# Patient Record
Sex: Female | Born: 1945 | Race: White | Hispanic: No | Marital: Married | Smoking: Former smoker
Health system: Southern US, Community
[De-identification: ages and names within clinical notes are randomized; demographics above are authoritative.]

## PROBLEM LIST (undated history)

## (undated) DIAGNOSIS — G459 Transient cerebral ischemic attack, unspecified: Secondary | ICD-10-CM

## (undated) DIAGNOSIS — K922 Gastrointestinal hemorrhage, unspecified: Secondary | ICD-10-CM

## (undated) DIAGNOSIS — I10 Essential (primary) hypertension: Secondary | ICD-10-CM

## (undated) DIAGNOSIS — K219 Gastro-esophageal reflux disease without esophagitis: Secondary | ICD-10-CM

## (undated) DIAGNOSIS — E119 Type 2 diabetes mellitus without complications: Secondary | ICD-10-CM

## (undated) DIAGNOSIS — J189 Pneumonia, unspecified organism: Secondary | ICD-10-CM

## (undated) DIAGNOSIS — I4891 Unspecified atrial fibrillation: Secondary | ICD-10-CM

## (undated) DIAGNOSIS — I350 Nonrheumatic aortic (valve) stenosis: Secondary | ICD-10-CM

## (undated) DIAGNOSIS — M869 Osteomyelitis, unspecified: Secondary | ICD-10-CM

## (undated) DIAGNOSIS — G629 Polyneuropathy, unspecified: Secondary | ICD-10-CM

## (undated) DIAGNOSIS — E039 Hypothyroidism, unspecified: Secondary | ICD-10-CM

## (undated) DIAGNOSIS — R9430 Abnormal result of cardiovascular function study, unspecified: Secondary | ICD-10-CM

## (undated) DIAGNOSIS — T7840XA Allergy, unspecified, initial encounter: Secondary | ICD-10-CM

## (undated) DIAGNOSIS — Z91040 Latex allergy status: Secondary | ICD-10-CM

## (undated) DIAGNOSIS — D649 Anemia, unspecified: Secondary | ICD-10-CM

## (undated) DIAGNOSIS — I639 Cerebral infarction, unspecified: Secondary | ICD-10-CM

## (undated) DIAGNOSIS — I340 Nonrheumatic mitral (valve) insufficiency: Secondary | ICD-10-CM

## (undated) DIAGNOSIS — I2510 Atherosclerotic heart disease of native coronary artery without angina pectoris: Secondary | ICD-10-CM

## (undated) HISTORY — PX: TONSILLECTOMY: SUR1361

## (undated) HISTORY — PX: CHOLECYSTECTOMY: SHX55

## (undated) HISTORY — DX: Abnormal result of cardiovascular function study, unspecified: R94.30

## (undated) HISTORY — DX: Latex allergy status: Z91.040

## (undated) HISTORY — DX: Transient cerebral ischemic attack, unspecified: G45.9

## (undated) HISTORY — PX: TONSILLECTOMY: SHX5217

## (undated) HISTORY — PX: HERNIA REPAIR: SHX51

## (undated) HISTORY — DX: Nonrheumatic mitral (valve) insufficiency: I34.0

## (undated) HISTORY — PX: VENTRAL HERNIA REPAIR: SHX424

## (undated) HISTORY — DX: Unspecified atrial fibrillation: I48.91

## (undated) HISTORY — DX: Gastrointestinal hemorrhage, unspecified: K92.2

## (undated) HISTORY — PX: ABDOMINAL HYSTERECTOMY: SHX81

## (undated) HISTORY — DX: Atherosclerotic heart disease of native coronary artery without angina pectoris: I25.10

## (undated) HISTORY — DX: Nonrheumatic aortic (valve) stenosis: I35.0

## (undated) HISTORY — DX: Allergy, unspecified, initial encounter: T78.40XA

## (undated) HISTORY — PX: OTHER SURGICAL HISTORY: SHX169

---

## 1980-09-07 HISTORY — PX: OTHER SURGICAL HISTORY: SHX169

## 2003-03-28 ENCOUNTER — Ambulatory Visit (HOSPITAL_COMMUNITY): Admission: RE | Admit: 2003-03-28 | Discharge: 2003-03-28 | Payer: Self-pay | Admitting: Unknown Physician Specialty

## 2004-02-13 ENCOUNTER — Ambulatory Visit (HOSPITAL_COMMUNITY): Admission: RE | Admit: 2004-02-13 | Discharge: 2004-02-13 | Payer: Self-pay | Admitting: Unknown Physician Specialty

## 2004-02-15 ENCOUNTER — Ambulatory Visit (HOSPITAL_COMMUNITY): Admission: RE | Admit: 2004-02-15 | Discharge: 2004-02-15 | Payer: Self-pay | Admitting: Unknown Physician Specialty

## 2004-09-29 ENCOUNTER — Ambulatory Visit: Payer: Self-pay | Admitting: Family Medicine

## 2005-06-10 ENCOUNTER — Ambulatory Visit: Payer: Self-pay | Admitting: Family Medicine

## 2005-07-02 ENCOUNTER — Ambulatory Visit: Payer: Self-pay | Admitting: Family Medicine

## 2006-07-21 ENCOUNTER — Ambulatory Visit: Payer: Self-pay | Admitting: Family Medicine

## 2006-07-22 ENCOUNTER — Ambulatory Visit: Payer: Self-pay | Admitting: Family Medicine

## 2006-09-28 ENCOUNTER — Ambulatory Visit: Payer: Self-pay | Admitting: Family Medicine

## 2007-01-13 ENCOUNTER — Ambulatory Visit: Payer: Self-pay | Admitting: Family Medicine

## 2008-03-14 ENCOUNTER — Ambulatory Visit: Payer: Self-pay | Admitting: Vascular Surgery

## 2008-04-13 ENCOUNTER — Ambulatory Visit: Payer: Self-pay | Admitting: Vascular Surgery

## 2008-05-02 ENCOUNTER — Ambulatory Visit: Payer: Self-pay | Admitting: Vascular Surgery

## 2008-05-16 ENCOUNTER — Ambulatory Visit: Payer: Self-pay | Admitting: Vascular Surgery

## 2008-06-19 ENCOUNTER — Ambulatory Visit: Payer: Self-pay | Admitting: Vascular Surgery

## 2009-12-04 ENCOUNTER — Ambulatory Visit: Payer: Self-pay | Admitting: Cardiology

## 2010-01-17 ENCOUNTER — Encounter: Admission: RE | Admit: 2010-01-17 | Discharge: 2010-01-17 | Payer: Self-pay | Admitting: Gastroenterology

## 2010-02-13 ENCOUNTER — Encounter: Admission: RE | Admit: 2010-02-13 | Discharge: 2010-02-13 | Payer: Self-pay | Admitting: Neurology

## 2011-01-20 NOTE — Letter (Signed)
March 14, 2008   Margaretmary Bayley, M.D.  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  As  you know, she is a 65 year old white female with a long history of  venous pathology.  She has undergone prior vein stripping on the left  years ago.  This was done in [place] in the 1980s.  It appears that she has  had her saphenous vein stripped from her ankle to her knee and stab  phlebectomy as well.  She has had a several-month history now of  ulceration over her left lateral malleolus.  She had a trivial abrasion  of this on a chair, and this developed a skin tear with very poor  healing.  She had sutures placed greater than a month ago at the ER, and  I have removed these sutures today.  She also recently dropped a book on  her medial dorsal foot and also has a tear at this area as well.  She  does not have any history of arterial insufficiency.   MEDICAL HISTORY:  Significant for hypertension, bronchitis, and heart  murmur.  She does have a history of non-insulin-dependent diabetes.   SOCIAL HISTORY:  She is married with 1 child.  She is retired.  She quit  smoking 25 years ago, does not drink alcohol on a regular basis.   REVIEW OF SYSTEMS:  Positive for weight gain.  She currently weighs 280  pounds.  She is 5 feet 10 inches tall.  She does have a history of arthritis as well.   PHYSICAL EXAM:  Well-developed obese white female, in no acute distress.  She does have dorsalis pedis pulses bilaterally.  Her lower extremities  are noted for marked saphenous and tributary varicosities bilaterally,  more so on her left leg than on her right.  She does have an angry-  appearing ulcer over her lateral ankle above her malleolus with marked  skin changes of  hyperpigmentation.  She has a clean tear in the medial  aspect of the arch of her foot.   She underwent noninvasive vascular laboratory studies in our office, and  this reveals reflux in the lateral branch of her saphenous vein on her  left leg, and also her small saphenous vein.  She does have reflux in  her left common femoral vein and mild reflux in her right common femoral  vein.  I had a long discussion with the patient and her family present.  I explained the critical importance of compression for healing of her  venous ulcer.  We have initiated her on Silvadene and Ace wrap  treatments to this.  I plan to see her again in 1 month.  I think that  she does have a greater chance for long-term healing and reduction of  recurrence of her venous ulcers if she would have treatment with  ablation of her lateral branch of her incompetent great saphenous vein  and stab phlebectomies of her multiple tributary varicosities.  We will  discuss this further as we can achieve healing of her ankle ulcer.  We  will see her again in 1 month and I appreciate the  opportunity to see  this nice patient with you.   Larina Earthly, M.D.  Electronically Signed   TFE/MEDQ  D:  03/14/2008  T:  03/15/2008  Job:  [id number]

## 2011-01-20 NOTE — Procedures (Signed)
LOWER EXTREMITY VENOUS REFLUX EXAM   INDICATION:  Swelling.   EXAM:  Using color-flow imaging and pulse Doppler spectral analysis, the  bilateral common femoral, superficial femoral, popliteal, posterior  tibial, greater and lesser saphenous veins are evaluated.  There is  evidence suggesting deep venous insufficiency in the bilateral lower  extremities at the common femoral vein level (mild on the right and  severe on the left).   The right saphenofemoral junction is competent.  The left saphenofemoral  junction is not competent.  The bilateral greater saphenous veins are  competent.  The left lateral accessory saphenous vein is not competent  with a maximum diameter of 1.34 cm at the proximal thigh and marked  tortuosity extending from the mid thigh to the distal calf.   The left proximal short saphenous vein demonstrates incompetency with  caliber measurements ranging from 0.24 to 0.2 cm.  The right proximal  short saphenous vein demonstrates competency.   GSV Diameter (used if found to be incompetent only)                                            Right    Left  Proximal Greater Saphenous Vein           cm       cm  Proximal-to-mid-thigh                     cm       cm  Mid thigh                                 cm       cm  Mid-distal thigh                          cm       cm  Distal thigh                              cm       cm  Knee                                      cm       cm   IMPRESSION:  1. Bilateral greater saphenous vein reflux is not identified.  2. Left lateral accessory saphenous vein reflux is noted, as described      above.  3. The bilateral greater saphenous veins are not aneurysmal or      tortuous.  4. The deep venous system is not competent, as described above.  5. The right lesser saphenous vein is competent.  6. The left lesser saphenous vein is not competent, as described      above.      ___________________________________________  Larina Earthly, M.D.   CH/MEDQ  D:  03/14/2008  T:  03/14/2008  Job:  [id number]

## 2011-01-20 NOTE — Assessment & Plan Note (Signed)
OFFICE VISIT   Melissa Becker, Melissa Becker  DOB:  Aug 10, 1946                                       04/13/2008  CHART#:[id number]   The patient presents today for continued followup of her severe venous  ulcer.  She has had some healing, which is quite slow on the left  pretibial shin area.  She does have incompetence of her anterior branch  of her great saphenous vein with multiple tributary varicosities feeding  into this area.  She will continue her local care facility and Ace  wraps.  I have recommend we proceed with laser ablation and stab  phlebectomy of her left great saphenous vein and tributaries for  improvement of the chances of healing and to maintain healing.  We will  proceed with this when we can assure insurance coverage for her.   Larina Earthly, M.D.  Electronically Signed   TFE/MEDQ  D:  04/13/2008  T:  04/16/2008  Job:  [id number]   cc:   Margaretmary Bayley, M.D.

## 2011-01-20 NOTE — Assessment & Plan Note (Signed)
OFFICE VISIT   Melissa Becker, Melissa Becker  DOB:  01/17/1946                                       06/19/2008  CHART#:[id number]   The patient is a patient of Dr. Bosie Helper who was scheduled today by  mistake.  She has had multiple stab phlebectomies in the left leg by Dr.  Arbie Cookey on August 26.  She had a stasis ulcer on the lateral aspect of the  distal lower leg on the left.  She has a remote history of ligation and  stripping of the left greater saphenous vein.  There is reflux in a  lateral accessory branch of the left saphenous vein, which he attempted  to close, but was unable to access it.  She returns today with complete  healing of the stasis ulcer, but continued discomfort and prominent  varicosities below the knee.  She also has diffuse varicosities in the  right lower extremity and has a history of bleeding on a few occasions  in the past.  Previous venous duplex exam reveals no reflux in the right  greater saphenous or right lesser saphenous systems.   EXAM:  She does have multiple varicosities in the right thigh and calf  with some very superficial ones in the pretibial area on the right leg.  There is hyperpigmentation and scaliness of the skin in the left leg  with complete healing of the ulcer, although the skin is thin.   We recommended wearing short-leg elastic compression stockings on a  chronic basis, as well as elevating the leg at night.  She may benefit  from stab phlebectomies of this collection of lateral calf varicosities,  which are quite large, extending down to the area of skin change.  She  will return in 3 months for further followup with Dr. Arbie Cookey.   [name], M.D.  Electronically Signed   JDL/MEDQ  D:  06/19/2008  T:  06/20/2008  Job:  [id number]

## 2011-01-20 NOTE — Assessment & Plan Note (Signed)
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  She had had a procedure 2 weeks  ago.  She did have access short segment of anterior saphenous.  She had  had prior stripping of her saphenous vein.  There was an anterior branch  that had a short segment in her proximal thigh that fed a very large  tributary coursing over the anterolateral thigh to her lateral knee and  on to her calf.  Despite using the Philips imager due to the depth and  short segment just did not get a safe imaging to treat this area and  therefore abandoning laser ablation of this segment.  We did proceed  with stab phlebectomy of the marked tributary varicosities on her  anterior and lateral thigh.  She has had good response.  She reports  relief of discomfort and does have continued healing of the pretibial  ulcer on the left.  Her stab phlebectomy incisions are healing, one on  the anterior thigh was through prior scar from a large incision from her  prior vein surgery.  There was some erythema of this and this is  resolving as well.  She will continue her usual activities, will  continue Silvadene and Ace wrap to her ankle and calf and I will see her  again in 1 month for continued followup.   Larina Earthly, M.D.  Electronically Signed   TFE/MEDQ  D:  05/16/2008  T:  05/17/2008  Job:  [id number]

## 2011-01-23 NOTE — Procedures (Signed)
NAME:  Melissa Becker, Melissa Becker                           ACCOUNT NO.:  [id number]   MEDICAL RECORD NO.:  [id number]                   PATIENT TYPE:  OUT   LOCATION:  RAD                                  FACILITY:  [facility]   PHYSICIAN:  Dani Gobble, MD                    DATE OF BIRTH:  09-27-1945   DATE OF PROCEDURE:  03/28/2003  DATE OF DISCHARGE:                                  ECHOCARDIOGRAM   PROCEDURE:  Echocardiogram   INDICATIONS:  Ms. Dimaano is a 65 year old female with a history of right  bundle branch block and hypertension, diabetes , and childhood rheumatic  fever who was found to have a systolic murmur.   TECHNICAL QUALITY:  The technical quality of this study is adequate.   FINDINGS:  1. The aorta is within normal limits at 2.5 cm.  2. The left atrium is dilated at 4.5 cm.  No obvious clots or masses were     appreciated.  The patient appeared to be in sinus rhythm during this     procedure.  3. The interventricular septum and posterior wall were mildly thickened.  4. The aortic valve was not well visualized, but appeared to be mildly     thickened.  No obvious aortic insufficiency was noted.  Doppler     interrogation of the aortic valve revealed a peak velocity of 1.6     meters/second corresponding to a peak gradient of 16 mm Hg and a mean     gradient of 9 mmHg.  5. The mitral valve is also mildly thickened on both the anterior and     posterior leaflets.  No mitral valve prolapse is noted.  No hockey     stick appearance, doming, or restriction of leaflet excursion to suggest     rheumatic involvement of the mitral valve.  Mild mitral regurgitation is     noted.  The Doppler interrogation of the mitral valve was within normal     limits.  Mild mitral annular calcification was noted.  6. The pulmonic valve was not well visualized, but trivial pulmonic     insufficiency was noted.  7. The tricuspid valve appeared grossly structurally normal with trace-to-     mild  tricuspid regurgitation.  8. The left ventricular was normal in size with the LVIDD measured at 5.2 cm     and the LVISD measured at 3.9 cm.  Overall left ventricular systolic     function was normal and no regional wall motion abnormalities were noted.     There was no evidence for diastolic dysfunction.  The right atrium and     the right ventricle appeared normal in size with normal right ventricular     systolic function.   IMPRESSION:  1. Mild left atrial enlargement.  2. Mild concentric left ventricular hypertrophy.  3. Normal left ventricular chamber size and  systolic function.  4. No regional wall motion abnormalities noted.  5. Mildly thickened mitral valve (both leaflets) but no limitation to     leaflet excursion and no findings suggestive of rheumatic involvement of     the mitral valve.  No mitral valve prolapse is noted.  Mild mitral     regurgitation is     noted.  Mild mitral annular calcification is present.  6. Trace-to-mild tricuspid regurgitation.  7. The aortic valve is thickened consistent with aortic sclerosis without     true stenosis noted.                                               Dani Gobble, MD    AB/MEDQ  D:  03/28/2003  T:  03/28/2003  Job:  [id number]   cc:   Colon Flattery, MD  [address]  [place]  [place] [address]  Fax: [phone]

## 2012-09-07 DIAGNOSIS — K922 Gastrointestinal hemorrhage, unspecified: Secondary | ICD-10-CM

## 2012-09-07 HISTORY — DX: Gastrointestinal hemorrhage, unspecified: K92.2

## 2013-04-13 DIAGNOSIS — R748 Abnormal levels of other serum enzymes: Secondary | ICD-10-CM

## 2013-04-14 ENCOUNTER — Other Ambulatory Visit: Payer: Self-pay | Admitting: Physician Assistant

## 2013-04-14 ENCOUNTER — Inpatient Hospital Stay (HOSPITAL_COMMUNITY)
Admission: AD | Admit: 2013-04-14 | Discharge: 2013-04-19 | DRG: 378 | Disposition: A | Discharge status: Home / Self Care | Payer: Medicare Other | Source: Other Acute Inpatient Hospital | Attending: Internal Medicine | Admitting: Internal Medicine

## 2013-04-14 ENCOUNTER — Encounter (HOSPITAL_COMMUNITY): Payer: Self-pay | Admitting: Gastroenterology

## 2013-04-14 DIAGNOSIS — I519 Heart disease, unspecified: Secondary | ICD-10-CM | POA: Diagnosis not present

## 2013-04-14 DIAGNOSIS — K922 Gastrointestinal hemorrhage, unspecified: Secondary | ICD-10-CM

## 2013-04-14 DIAGNOSIS — R0989 Other specified symptoms and signs involving the circulatory and respiratory systems: Secondary | ICD-10-CM | POA: Diagnosis present

## 2013-04-14 DIAGNOSIS — E669 Obesity, unspecified: Secondary | ICD-10-CM

## 2013-04-14 DIAGNOSIS — E1169 Type 2 diabetes mellitus with other specified complication: Secondary | ICD-10-CM | POA: Diagnosis present

## 2013-04-14 DIAGNOSIS — R6884 Jaw pain: Secondary | ICD-10-CM | POA: Diagnosis present

## 2013-04-14 DIAGNOSIS — I4949 Other premature depolarization: Secondary | ICD-10-CM | POA: Diagnosis present

## 2013-04-14 DIAGNOSIS — I249 Acute ischemic heart disease, unspecified: Secondary | ICD-10-CM

## 2013-04-14 DIAGNOSIS — R079 Chest pain, unspecified: Secondary | ICD-10-CM

## 2013-04-14 DIAGNOSIS — R0600 Dyspnea, unspecified: Secondary | ICD-10-CM

## 2013-04-14 DIAGNOSIS — E119 Type 2 diabetes mellitus without complications: Secondary | ICD-10-CM | POA: Diagnosis present

## 2013-04-14 DIAGNOSIS — D649 Anemia, unspecified: Secondary | ICD-10-CM

## 2013-04-14 DIAGNOSIS — Y840 Cardiac catheterization as the cause of abnormal reaction of the patient, or of later complication, without mention of misadventure at the time of the procedure: Secondary | ICD-10-CM | POA: Diagnosis not present

## 2013-04-14 DIAGNOSIS — I441 Atrioventricular block, second degree: Secondary | ICD-10-CM

## 2013-04-14 DIAGNOSIS — I2510 Atherosclerotic heart disease of native coronary artery without angina pectoris: Secondary | ICD-10-CM | POA: Diagnosis present

## 2013-04-14 DIAGNOSIS — D62 Acute posthemorrhagic anemia: Secondary | ICD-10-CM

## 2013-04-14 DIAGNOSIS — I10 Essential (primary) hypertension: Secondary | ICD-10-CM

## 2013-04-14 DIAGNOSIS — R0602 Shortness of breath: Secondary | ICD-10-CM

## 2013-04-14 DIAGNOSIS — I442 Atrioventricular block, complete: Secondary | ICD-10-CM | POA: Diagnosis not present

## 2013-04-14 DIAGNOSIS — K31811 Angiodysplasia of stomach and duodenum with bleeding: Principal | ICD-10-CM | POA: Diagnosis present

## 2013-04-14 DIAGNOSIS — R0609 Other forms of dyspnea: Secondary | ICD-10-CM | POA: Diagnosis present

## 2013-04-14 HISTORY — DX: Polyneuropathy, unspecified: G62.9

## 2013-04-14 HISTORY — DX: Essential (primary) hypertension: I10

## 2013-04-14 HISTORY — DX: Type 2 diabetes mellitus without complications: E11.9

## 2013-04-14 HISTORY — DX: Hypothyroidism, unspecified: E03.9

## 2013-04-14 MED ORDER — ATORVASTATIN CALCIUM 80 MG PO TABS
80.0000 mg | ORAL_TABLET | Freq: Every day | ORAL | Status: DC
  Administered 2013-04-14 – 2013-04-18 (×5): 80 mg via ORAL
  Filled 2013-04-14 (×6): qty 1

## 2013-04-14 MED ORDER — INSULIN ASPART 100 UNIT/ML ~~LOC~~ SOLN
0.0000 [IU] | Freq: Three times a day (TID) | SUBCUTANEOUS | Status: DC
  Administered 2013-04-15: 3 [IU] via SUBCUTANEOUS

## 2013-04-14 MED ORDER — DULOXETINE HCL 60 MG PO CPEP
60.0000 mg | ORAL_CAPSULE | Freq: Every day | ORAL | Status: DC
  Administered 2013-04-14 – 2013-04-19 (×5): 60 mg via ORAL
  Filled 2013-04-14 (×6): qty 1

## 2013-04-14 MED ORDER — AMLODIPINE BESYLATE 10 MG PO TABS
10.0000 mg | ORAL_TABLET | Freq: Every day | ORAL | Status: DC
  Administered 2013-04-14 – 2013-04-19 (×5): 10 mg via ORAL
  Filled 2013-04-14 (×6): qty 1

## 2013-04-14 MED ORDER — PANTOPRAZOLE SODIUM 40 MG PO TBEC: 40.0000 mg | DELAYED_RELEASE_TABLET | Freq: Every day | ORAL | Status: DC

## 2013-04-14 MED ORDER — LINAGLIPTIN 5 MG PO TABS
5.0000 mg | ORAL_TABLET | Freq: Every day | ORAL | Status: DC
  Administered 2013-04-16 – 2013-04-19 (×3): 5 mg via ORAL
  Filled 2013-04-14 (×5): qty 1

## 2013-04-14 MED ORDER — FERROUS FUMARATE 325 (106 FE) MG PO TABS
1.0000 | ORAL_TABLET | Freq: Three times a day (TID) | ORAL | Status: DC
  Administered 2013-04-14 – 2013-04-19 (×15): 106 mg via ORAL
  Filled 2013-04-14 (×17): qty 1

## 2013-04-14 MED ORDER — GLIMEPIRIDE 4 MG PO TABS
4.0000 mg | ORAL_TABLET | Freq: Every day | ORAL | Status: DC
  Administered 2013-04-16 – 2013-04-19 (×2): 4 mg via ORAL
  Filled 2013-04-14 (×6): qty 1

## 2013-04-14 MED ORDER — INSULIN ASPART 100 UNIT/ML ~~LOC~~ SOLN: 0.0000 [IU] | Freq: Every day | SUBCUTANEOUS | Status: DC

## 2013-04-14 MED ORDER — HYDROCODONE-ACETAMINOPHEN 5-325 MG PO TABS: 1.0000 | ORAL_TABLET | ORAL | Status: DC | PRN

## 2013-04-14 MED ORDER — LEVOTHYROXINE SODIUM 100 MCG PO TABS
100.0000 ug | ORAL_TABLET | Freq: Every day | ORAL | Status: DC
  Administered 2013-04-16 – 2013-04-19 (×3): 100 ug via ORAL
  Filled 2013-04-14 (×6): qty 1

## 2013-04-14 MED ORDER — SODIUM CHLORIDE 0.9 % IV SOLN
INTRAVENOUS | Status: DC
  Administered 2013-04-14 – 2013-04-15 (×2): via INTRAVENOUS

## 2013-04-14 MED ORDER — ASPIRIN-DIPYRIDAMOLE ER 25-200 MG PO CP12
1.0000 | ORAL_CAPSULE | Freq: Every day | ORAL | Status: DC
  Filled 2013-04-14 (×2): qty 1

## 2013-04-14 MED ORDER — NITROGLYCERIN 0.4 MG SL SUBL: 0.4000 mg | SUBLINGUAL_TABLET | SUBLINGUAL | Status: DC | PRN

## 2013-04-14 MED ORDER — ACETAMINOPHEN 325 MG PO TABS: 650.0000 mg | ORAL_TABLET | ORAL | Status: DC | PRN

## 2013-04-14 MED ORDER — DIPHENHYDRAMINE HCL 12.5 MG/5ML PO ELIX
12.5000 mg | ORAL_SOLUTION | Freq: Once | ORAL | Status: AC
  Administered 2013-04-14: 12.5 mg via ORAL
  Filled 2013-04-14: qty 5

## 2013-04-14 MED ORDER — BENAZEPRIL HCL 40 MG PO TABS
40.0000 mg | ORAL_TABLET | Freq: Every day | ORAL | Status: DC
  Administered 2013-04-16 – 2013-04-19 (×4): 40 mg via ORAL
  Filled 2013-04-14 (×6): qty 1

## 2013-04-14 MED ORDER — ONDANSETRON HCL 4 MG/2ML IJ SOLN: 4.0000 mg | Freq: Four times a day (QID) | INTRAMUSCULAR | Status: DC | PRN

## 2013-04-14 MED ORDER — PANTOPRAZOLE SODIUM 40 MG PO TBEC
40.0000 mg | DELAYED_RELEASE_TABLET | Freq: Two times a day (BID) | ORAL | Status: DC
  Administered 2013-04-14 – 2013-04-19 (×9): 40 mg via ORAL
  Filled 2013-04-14 (×9): qty 1

## 2013-04-14 NOTE — Progress Notes (Signed)
Patient ID: Melissa Becker, female   DOB: 1945/11/20, 67 y.o.   MRN: [id number]   I saw this patient today at [facility]. Complete consultation was done and all of the records were to have been sent. I will try to help arrange for these to be appropriately scanned into EPIC.  The patient has cardiac disease that needs to be assessed further during this hospitalization. However the plan is to do this after her GI evaluation is done. She has a bundle branch block. 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 We have seen more of this rhythm during her hospitalization at [facility]. At times there is 2-1 block. This has not been symptomatic. She has had some chest discomfort over the past months. However she is admitted with a hemoglobin of 7.  The patient was transfused yesterday. She did receive one dose of Lasix when she became short of breath.  We need to proceed with her GI evaluation. Based on what is found we can make further decisions about the treatment of her GI tract. We can then also proceed with further evaluation of her chest discomfort and further evaluation of her rhythm abnormality ( if this is needed). If it appears that a coronary intervention is needed at some time, we will have the information from the GI evaluation to help with the planning.  Jerral Bonito, MD

## 2013-04-14 NOTE — Consult Note (Signed)
[facility] Reason for consult: G.I. bleeding Referring Physician: Dr. Myrtis Ser, PCP: Dr Osvaldo Human is an 67 y.o. female.  HPI: 67 year old woman and I saw some years ago. She apparently had 3 attempts and colonoscopy by Dr Michaelyn Barter and [facility]. These were done to cause of a strong family history of colon cancer and her father. I saw her in 2011 with rectal bleeding. We felt that in view of failed colonoscopies and bright red blood that a barium enema in sigmoidoscopy would be appropriate. The patient and apparently had TIAs around that time and have been started in Aggrenox. barium enema in essence was negative and flexible sigmoidoscopy to 40 cm was normal other than what appeared to be a red and polypoid area 25 cm with half showing hyperplastic area without any adenomatous tissue. We recommended yearly Hemoccult's and repeat barium enema/sigmoidoscopy or virtual colonoscopy at 5 year interval. The patient has been feeling very weak and short of breath with some chest pain and pain in her jaw with exertion. She saw Dr Lysbeth Galas with these symptoms and was found to have a hemoglobin of 7. She was sent to [facility] and receive 2 units of blood. Apparently there were some cardiac issues. I have no records available to me at the patient reports to me that she had some type of arrhythmia and was transferred down here for possible catheterization of her heart and workup for G.I. Bleeding. She is head approximately 2 months of dark stool with out any heartburn or indigestion. She's had postprandial bloating and marked increase in belching. She is not been taking any routine acid reducing medications. She adamantly denies the use of NSAIDs. I diet has been ordered for her for dinner tonight.  Past Medical History  Diagnosis Date  . Hypothyroid   . Type 2 diabetes mellitus   . Hypertension   . Cerebrovascular disease     history of TIAs  . Neuropathy     Past Surgical  History  Procedure Laterality Date  . Tonsillectomy    . Abdominal hysterectomy    . Cholecystectomy    . Ventral hernia repair      Family History  Problem Relation Age of Onset  . Colon cancer Father     Social History:  reports that she has never smoked. She does not have any smokeless tobacco history on file. She reports that she does not drink alcohol or use illicit drugs.  Allergies: Allergies not on file  Medications; . amLODipine  10 mg Oral Daily  . atorvastatin  80 mg Oral q1800  . benazepril  40 mg Oral Daily  . [START ON 04/15/2013] dipyridamole-aspirin  1 capsule Oral Daily  . DULoxetine  60 mg Oral Daily  . ferrous fumarate  1 tablet Oral TID  . [START ON 04/15/2013] glimepiride  4 mg Oral Q breakfast  . insulin aspart  0-15 Units Subcutaneous TID WC  . insulin aspart  0-5 Units Subcutaneous QHS  . [START ON 04/15/2013] levothyroxine  100 mcg Oral QAC breakfast  . [START ON 04/15/2013] linagliptin  5 mg Oral Daily  . [START ON 04/15/2013] pantoprazole  40 mg Oral Q1200   PRN Meds acetaminophen, HYDROcodone-acetaminophen, nitroGLYCERIN, ondansetron (ZOFRAN) IV Results for orders placed during the hospital encounter of 04/14/13 (from the past 48 hour(s))  GLUCOSE, CAPILLARY     Status: Abnormal   Collection Time    04/14/13  3:49 PM      Result Value Range  Glucose-Capillary 104 (*) 70 - 99 mg/dL    No results found.             Blood pressure 183/35, pulse 75, temperature 97.8 F (36.6 C), temperature source Oral, resp. rate 19, SpO2 93.00%.  Physical exam:   General-- obese white female in no acute distress Heart-- regular rate and rhythm without murmurs are gallops Lungs--clear Abdomen-- soft and nontender   Assessment: 1. Anemia/heme positive stools. A cause of this is not clear however, with increased belching and postprandial symptoms an ulcer needs to be ruled out. She may need a: evaluation at some point, but she has had 3 attempted  colonoscopies in the past that have failed. 2. Chest pain and cardiac arrhythmia. The cardiac workup is still pending about this. 3. Strong family history of colon cancer  Plan: 1. We'll go ahead and empirically begin PPI therapy 2. We'll make and PO after midnight in case it appears that EGD in the morning would be feasible. We'll wait until her cardiac workup has been decided. If it is felt that EGD needs to be delayed pending further cardiac evaluation, go ahead and resume the diet in the morning. My partner Dr Evette Cristal will check her in the morning.   Ibrahem Volkman JR,Darenda Fike L 04/14/2013, 4:54 PM

## 2013-04-15 ENCOUNTER — Encounter (HOSPITAL_COMMUNITY): Payer: Self-pay | Admitting: *Deleted

## 2013-04-15 ENCOUNTER — Encounter (HOSPITAL_COMMUNITY)
Admission: AD | Disposition: A | Discharge status: Home / Self Care | Payer: Self-pay | Source: Other Acute Inpatient Hospital | Attending: Internal Medicine

## 2013-04-15 DIAGNOSIS — R0602 Shortness of breath: Secondary | ICD-10-CM

## 2013-04-15 DIAGNOSIS — D649 Anemia, unspecified: Secondary | ICD-10-CM

## 2013-04-15 DIAGNOSIS — I200 Unstable angina: Secondary | ICD-10-CM

## 2013-04-15 DIAGNOSIS — R079 Chest pain, unspecified: Secondary | ICD-10-CM

## 2013-04-15 HISTORY — PX: ESOPHAGOGASTRODUODENOSCOPY: SHX5428

## 2013-04-15 LAB — CBC
Hemoglobin: 8.5 g/dL — ABNORMAL LOW (ref 12.0–15.0)
MCH: 25.3 pg — ABNORMAL LOW (ref 26.0–34.0)
RBC: 3.36 MIL/uL — ABNORMAL LOW (ref 3.87–5.11)

## 2013-04-15 LAB — LIPID PANEL
Cholesterol: 104 mg/dL (ref 0–200)
Total CHOL/HDL Ratio: 2.9 RATIO
VLDL: 19 mg/dL (ref 0–40)

## 2013-04-15 LAB — GLUCOSE, CAPILLARY
Glucose-Capillary: 123 mg/dL — ABNORMAL HIGH (ref 70–99)
Glucose-Capillary: 158 mg/dL — ABNORMAL HIGH (ref 70–99)
Glucose-Capillary: 152 mg/dL — ABNORMAL HIGH (ref 70–99)

## 2013-04-15 SURGERY — EGD (ESOPHAGOGASTRODUODENOSCOPY)
Anesthesia: Moderate Sedation

## 2013-04-15 MED ORDER — MIDAZOLAM HCL 10 MG/2ML IJ SOLN
INTRAMUSCULAR | Status: DC | PRN
  Administered 2013-04-15: 1 mg via INTRAVENOUS
  Administered 2013-04-15 (×2): 2 mg via INTRAVENOUS

## 2013-04-15 MED ORDER — FENTANYL CITRATE 0.05 MG/ML IJ SOLN
INTRAMUSCULAR | Status: DC | PRN
  Administered 2013-04-15 (×2): 25 ug via INTRAVENOUS

## 2013-04-15 MED ORDER — SODIUM CHLORIDE 0.9 % IV SOLN: INTRAVENOUS | Status: DC

## 2013-04-15 MED ORDER — BUTAMBEN-TETRACAINE-BENZOCAINE 2-2-14 % EX AERO
INHALATION_SPRAY | CUTANEOUS | Status: DC | PRN
  Administered 2013-04-15: 2 via TOPICAL

## 2013-04-15 NOTE — Progress Notes (Signed)
  Echocardiogram 2D Echocardiogram has been performed.  [name] Melissa Becker 04/15/2013, 12:44 PM

## 2013-04-15 NOTE — Op Note (Signed)
[facility] [address] [place], [address]   ENDOSCOPY PROCEDURE REPORT  PATIENT: Melissa Becker, Melissa Becker  MR#: [id number] BIRTHDATE: [birth year] , 66  yrs. old GENDER: Female ENDOSCOPIST: Wandalee Ferdinand, MD REFERRED BY: PROCEDURE DATE:  04/15/2013 PROCEDURE:   EGD ASA CLASS: 3 INDICATIONS: melena, heme positive stool, anemia MEDICATIONS: fentanyl 50 mcg IV, Versed 5 mg IV TOPICAL ANESTHETIC: Cetacaine spray  DESCRIPTION OF PROCEDURE:   After the risks benefits and alternatives of the procedure were thoroughly explained, informed consent was obtained.  The Pentax Gastroscope [id number]  endoscope was introduced through the mouth and advanced to the second portion of the duodenum      , limited by Without limitations.   The instrument was slowly withdrawn as the mucosa was fully examined.      FINDINGS:  Esophagus: Normal  Stomach: In the upper body/fundus of the stomach there is a small focal angiodysplastic lesion. This was not bleeding and it was sprayed with water and did not bleed. The rest of the stomach looked normal.  Duodenum: The duodenal bulb is normal and there is no evidence of inflammation or peptic ulcer disease. In the second portion of the duodenum as noted on image 7 there is a focal angiodysplastic lesion which was sprayed with water and could not be made to bleed.  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   RECOMMENDATIONS: The 2 focal angiodysplastic lesions that were seen on this examination were not bleeding. Angiodysplasia however is most likely the cause of her melena and anemia. The 2 that were seen at this time were not cauterized because of recent Aggrenox use. Whether or not cauterization of these would be of benefit is unclear simply because these 2 that were seen may just be the tip of the iceberg and that she could have multiple  scattered angiodysplasia throughout her small bowel as well as her colon which has never been able to be adequately visualized do to inability to perform a complete colonoscopy.I think it would be reasonable to do a capsule endoscopy to evaluate the rest of her small bowel. If she did not have multiple angiodysplasia throughout her small bowel then we could attempt argon plasma coagulation of the 2 angiodysplasia that were seen on this examination just to see if it prevented her from having further episodes of bleeding.      _______________________________ Rosalie DoctorWandalee Ferdinand, MD 04/15/2013 12:36 PM       PATIENT NAME:  Melissa Becker, Melissa Becker MR#: [id number]

## 2013-04-15 NOTE — Progress Notes (Signed)
   SUBJECTIVE: The patient is doing well today.  At this time, she denies chest pain, shortness of breath, or any new concerns.  She states her symptoms have improved since receiving transfusion.  Plan for EGD today to look for source of bleeding.   CURRENT MEDICATIONS: . amLODipine  10 mg Oral Daily  . atorvastatin  80 mg Oral q1800  . benazepril  40 mg Oral Daily  . dipyridamole-aspirin  1 capsule Oral Daily  . DULoxetine  60 mg Oral Daily  . ferrous fumarate  1 tablet Oral TID  . glimepiride  4 mg Oral Q breakfast  . insulin aspart  0-15 Units Subcutaneous TID WC  . insulin aspart  0-5 Units Subcutaneous QHS  . levothyroxine  100 mcg Oral QAC breakfast  . linagliptin  5 mg Oral Daily  . pantoprazole  40 mg Oral BID   . sodium chloride 50 mL/hr at 04/14/13 1635    OBJECTIVE: Physical Exam: Filed Vitals:   04/14/13 1836 04/14/13 2100 04/15/13 0500 04/15/13 0900  BP: 170/40 143/48 135/34   Pulse: 66 68 41   Temp:  98.3 F (36.8 C) 98.2 F (36.8 C)   TempSrc:      Resp:   20   Height:    5\' 10"  (1.778 m)  Weight:    260 lb (117.935 kg)  SpO2: 94% 94% 96%    No intake or output data in the 24 hours ending 04/15/13 1016  Telemetry reveals sinus rhythm with 1st degree AV block  GEN- The patient is well appearing, alert and oriented x 3 today.   Head- normocephalic, atraumatic Eyes-  Sclera clear, conjunctiva pink Ears- hearing intact Oropharynx- clear Neck- supple, no JVP Lymph- no cervical lymphadenopathy Lungs- Clear to ausculation bilaterally, normal work of breathing Heart- Regular rate and rhythm, 2/6 SEM LUSB (early to mid peaking) GI- soft, NT, ND, + BS Extremities- no clubbing, cyanosis, or edema Skin- no rash or lesion Psych- euthymic mood, full affect Neuro- strength and sensation are intact  LABS: Basic Metabolic Panel: No results found for this basename: NA, K, CL, CO2, GLUCOSE, BUN, CREATININE, CALCIUM, MG, PHOS,  in the last 72 hours Liver Function  Tests: No results found for this basename: AST, ALT, ALKPHOS, BILITOT, PROT, ALBUMIN,  in the last 72 hours No results found for this basename: LIPASE, AMYLASE,  in the last 72 hours CBC:  Recent Labs  04/15/13 0450  WBC 6.1  HGB 8.5*  HCT 27.8*  MCV 82.7  PLT 211  Fasting Lipid Panel:  Recent Labs  04/15/13 0450  CHOL 104  HDL 36*  LDLCALC 49  TRIG 95  CHOLHDL 2.9   .  ASSESSMENT AND PLAN:   1. Shortness of breath/ jaw pain- likely precipitated by profound anemia.  She is improving s/p PRBCs.  I agree with Dr Myrtis Ser that cath is necessary for further risk stratification after the cause for her GI bleeding /anemia have been determined.  Echo pending Continue current medical therapy for now  2. 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 Anemia/ GI bleeding- per GI

## 2013-04-16 DIAGNOSIS — K922 Gastrointestinal hemorrhage, unspecified: Secondary | ICD-10-CM

## 2013-04-16 LAB — BASIC METABOLIC PANEL
CO2: 23 mEq/L (ref 19–32)
Calcium: 9.2 mg/dL (ref 8.4–10.5)
GFR calc non Af Amer: 85 mL/min — ABNORMAL LOW (ref >90)
Glucose, Bld: 147 mg/dL — ABNORMAL HIGH (ref 70–99)
Potassium: 4.1 mEq/L (ref 3.5–5.1)
Sodium: 140 mEq/L (ref 135–145)

## 2013-04-16 LAB — CBC
Hemoglobin: 8.9 g/dL — ABNORMAL LOW (ref 12.0–15.0)
MCH: 25 pg — ABNORMAL LOW (ref 26.0–34.0)
MCHC: 29.9 g/dL — ABNORMAL LOW (ref 30.0–36.0)
Platelets: 212 10*3/uL (ref 150–400)
RBC: 3.56 MIL/uL — ABNORMAL LOW (ref 3.87–5.11)

## 2013-04-16 LAB — GLUCOSE, CAPILLARY
Glucose-Capillary: 127 mg/dL — ABNORMAL HIGH (ref 70–99)
Glucose-Capillary: 99 mg/dL (ref 70–99)

## 2013-04-16 MED ORDER — SODIUM CHLORIDE 0.9 % IV SOLN: 250.0000 mL | INTRAVENOUS | Status: DC | PRN

## 2013-04-16 MED ORDER — SODIUM CHLORIDE 0.9 % IJ SOLN: 3.0000 mL | INTRAMUSCULAR | Status: DC | PRN

## 2013-04-16 MED ORDER — SODIUM CHLORIDE 0.9 % IJ SOLN
3.0000 mL | Freq: Two times a day (BID) | INTRAMUSCULAR | Status: DC
  Administered 2013-04-16 (×2): 3 mL via INTRAVENOUS

## 2013-04-16 NOTE — Progress Notes (Signed)
She feels fine today and is no distress. No sign of active bleeding. For cardiac cath tomorrow. We will plan capsule endo also.

## 2013-04-16 NOTE — Progress Notes (Signed)
SUBJECTIVE: The patient is doing well today.  At this time, she denies chest pain, shortness of breath, or any new concerns.  She states her symptoms have improved since receiving transfusion.  She continues to have SOB.  EGD results are reviewed.   CURRENT MEDICATIONS: . amLODipine  10 mg Oral Daily  . atorvastatin  80 mg Oral q1800  . benazepril  40 mg Oral Daily  . dipyridamole-aspirin  1 capsule Oral Daily  . DULoxetine  60 mg Oral Daily  . ferrous fumarate  1 tablet Oral TID  . glimepiride  4 mg Oral Q breakfast  . insulin aspart  0-15 Units Subcutaneous TID WC  . insulin aspart  0-5 Units Subcutaneous QHS  . levothyroxine  100 mcg Oral QAC breakfast  . linagliptin  5 mg Oral Daily  . pantoprazole  40 mg Oral BID   . sodium chloride 50 mL/hr at 04/15/13 1423  . sodium chloride      OBJECTIVE: Physical Exam: Filed Vitals:   04/15/13 1233 04/15/13 1420 04/15/13 2100 04/16/13 0500  BP: 155/70 149/47 134/34 125/53  Pulse:   46 74  Temp:  98.5 F (36.9 C) 97.9 F (36.6 C) 97.9 F (36.6 C)  TempSrc:  Oral    Resp: 19 18 20 18   Height:      Weight:      SpO2: 98% 97% 96% 95%    Intake/Output Summary (Last 24 hours) at 04/16/13 0844 Last data filed at 04/15/13 2100  Gross per 24 hour  Intake    440 ml  Output      0 ml  Net    440 ml    Telemetry reveals sinus rhythm with 1st degree AV block  GEN- The patient is well appearing, alert and oriented x 3 today.   Head- normocephalic, atraumatic Eyes-  Sclera clear, conjunctiva pink Ears- hearing intact Oropharynx- clear Neck- supple, no JVP Lymph- no cervical lymphadenopathy Lungs- Clear to ausculation bilaterally, normal work of breathing Heart- Regular rate and rhythm, 2/6 SEM LUSB (early to mid peaking) GI- soft, NT, ND, + BS Extremities- no clubbing, cyanosis, or edema Skin- no rash or lesion Psych- euthymic mood, full affect Neuro- strength and sensation are intact  LABS: Basic Metabolic  Panel:  Recent Labs  04/16/13 0400  NA 140  K 4.1  CL 106  CO2 23  GLUCOSE 147*  BUN 13  CREATININE 0.76  CALCIUM 9.2   Liver Function Tests: No results found for this basename: AST, ALT, ALKPHOS, BILITOT, PROT, ALBUMIN,  in the last 72 hours No results found for this basename: LIPASE, AMYLASE,  in the last 72 hours CBC:  Recent Labs  04/15/13 0450 04/16/13 0400  WBC 6.1 5.3  HGB 8.5* 8.9*  HCT 27.8* 29.8*  MCV 82.7 83.7  PLT 211 212  Fasting Lipid Panel:  Recent Labs  04/15/13 0450  CHOL 104  HDL 36*  LDLCALC 49  TRIG 95  CHOLHDL 2.9   .  ASSESSMENT AND PLAN:   1. Shortness of breath/ jaw pain- likely precipitated by profound anemia.  She is improving s/p PRBCs.  I agree with Dr Myrtis Ser that cath is necessary for further risk stratification after the cause for her GI bleeding /anemia have been determined.  Will proceed with diagnostic cath tomorrow.  Given GI findings, I am not sure that we would want to intervene at this time unless absolutely neceesary.  Echo pending Continue current medical therapy for now  2. Mobitz  I second degree AV block- chronic and asymptomatic, no further workup planned  3. Anemia/ GI bleeding- per GI.  Capsule endoscopy planned.  She is off of aggrenox now.  She is instructed to follow-up with neurology as an outpatient to see if she requires any further therapy for prior TIAs.

## 2013-04-17 ENCOUNTER — Encounter (HOSPITAL_COMMUNITY): Payer: Self-pay | Admitting: Gastroenterology

## 2013-04-17 ENCOUNTER — Encounter (HOSPITAL_COMMUNITY)
Admission: AD | Disposition: A | Discharge status: Home / Self Care | Payer: Self-pay | Source: Other Acute Inpatient Hospital | Attending: Internal Medicine

## 2013-04-17 DIAGNOSIS — I2510 Atherosclerotic heart disease of native coronary artery without angina pectoris: Secondary | ICD-10-CM

## 2013-04-17 HISTORY — PX: LEFT HEART CATHETERIZATION WITH CORONARY ANGIOGRAM: SHX5451

## 2013-04-17 LAB — CBC
HCT: 28.4 % — ABNORMAL LOW (ref 36.0–46.0)
MCH: 25.4 pg — ABNORMAL LOW (ref 26.0–34.0)
MCHC: 30.3 g/dL (ref 30.0–36.0)
MCV: 84 fL (ref 78.0–100.0)
RDW: 16.9 % — ABNORMAL HIGH (ref 11.5–15.5)

## 2013-04-17 LAB — GLUCOSE, CAPILLARY
Glucose-Capillary: 122 mg/dL — ABNORMAL HIGH (ref 70–99)
Glucose-Capillary: 128 mg/dL — ABNORMAL HIGH (ref 70–99)
Glucose-Capillary: 135 mg/dL — ABNORMAL HIGH (ref 70–99)

## 2013-04-17 LAB — BASIC METABOLIC PANEL
BUN: 14 mg/dL (ref 6–23)
Calcium: 9 mg/dL (ref 8.4–10.5)
Creatinine, Ser: 0.83 mg/dL (ref 0.50–1.10)
GFR calc Af Amer: 83 mL/min — ABNORMAL LOW (ref >90)
GFR calc non Af Amer: 71 mL/min — ABNORMAL LOW (ref >90)
Glucose, Bld: 156 mg/dL — ABNORMAL HIGH (ref 70–99)

## 2013-04-17 SURGERY — LEFT HEART CATHETERIZATION WITH CORONARY ANGIOGRAM
Anesthesia: LOCAL

## 2013-04-17 MED ORDER — NITROGLYCERIN 0.2 MG/ML ON CALL CATH LAB
INTRAVENOUS | Status: AC
  Filled 2013-04-17: qty 1

## 2013-04-17 MED ORDER — HEPARIN SODIUM (PORCINE) 1000 UNIT/ML IJ SOLN
INTRAMUSCULAR | Status: AC
  Filled 2013-04-17: qty 1

## 2013-04-17 MED ORDER — MIDAZOLAM HCL 2 MG/2ML IJ SOLN
INTRAMUSCULAR | Status: AC
  Filled 2013-04-17: qty 2

## 2013-04-17 MED ORDER — LIDOCAINE HCL (PF) 1 % IJ SOLN
INTRAMUSCULAR | Status: AC
  Filled 2013-04-17: qty 30

## 2013-04-17 MED ORDER — SODIUM CHLORIDE 0.9 % IV SOLN: 1.0000 mL/kg/h | INTRAVENOUS | Status: AC

## 2013-04-17 MED ORDER — HEPARIN (PORCINE) IN NACL 2-0.9 UNIT/ML-% IJ SOLN
INTRAMUSCULAR | Status: AC
  Filled 2013-04-17: qty 1500

## 2013-04-17 MED ORDER — FENTANYL CITRATE 0.05 MG/ML IJ SOLN
INTRAMUSCULAR | Status: AC
  Filled 2013-04-17: qty 2

## 2013-04-17 MED ORDER — VERAPAMIL HCL 2.5 MG/ML IV SOLN
INTRAVENOUS | Status: AC
  Filled 2013-04-17: qty 2

## 2013-04-17 NOTE — Progress Notes (Signed)
Right radial TR band removed per protocol. 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 Site level 1 due to bruising and tenderness at the site. Gauze and tegaderm applied. VSS throughout. Pt w/o complaints or complications. Will continue to monitor.

## 2013-04-17 NOTE — CV Procedure (Signed)
   Cardiac Catheterization Procedure Note  Name: Melissa Becker MRN: [id number] DOB: May 18, 1946  Procedure: Left Heart Cath, Selective Coronary Angiography  Indication: 67 yo WF with symptoms of dyspnea and jaw pain.    Procedural Details: The right wrist was prepped, draped, and anesthetized with 1% lidocaine. Using the modified Seldinger technique, a 5 French sheath was introduced into the right radial artery. 6 mg of verapamil was administered through the sheath, weight-based unfractionated heparin was administered intravenously. 200 micrograms of Ntg was also administered via the sheath. I was unable to pass a 5Fr catheter due to resistance. I switched to 4Fr catheters and was able to complete the study. Standard Judkins catheters were used for selective coronary angiography. Catheter exchanges were performed over an exchange length guidewire. There were no immediate procedural complications. A TR band was used for radial hemostasis at the completion of the procedure.  The patient was transferred to the post catheterization recovery area for further monitoring.  Procedural Findings: Hemodynamics: AO 167/46 mean of 79 mm Hg LV 185/23 mm Hg  Coronary angiography: Coronary dominance: right  Left mainstem: Normal.  Left anterior descending (LAD): diffuse 20% narrowing proximally.  Left circumflex (LCx): 30% diffuse disease proximally.  Right coronary artery (RCA): large dominant. 20-30% disease in the proximal and mid RCA.  Left ventriculography: LV gram was not done. When pigtail was placed in the LV the patient developed complete heart block, with frequent ventricular ectopy. Therefore, I did not hazard LV angiography. Patient returned to her Wenkebach AV block.  Final Conclusions:   1. Nonobstructive CAD 2. Mild AV gradient. This may not be completely accurate due to heart block.  Recommendations: Medical management. Pursue GI work up for anemia.  Theron Arista [facility] 04/17/2013,  11:45 AM

## 2013-04-17 NOTE — Interval H&P Note (Signed)
History and Physical Interval Note:  04/17/2013 10:56 AM  Melissa Becker  has presented today for surgery, with the diagnosis of cp  The various methods of treatment have been discussed with the patient and family. After consideration of risks, benefits and other options for treatment, the patient has consented to  Procedure(s): LEFT HEART CATHETERIZATION WITH CORONARY ANGIOGRAM (N/A) as a surgical intervention .  The patient's history has been reviewed, patient examined, no change in status, stable for surgery.  I have reviewed the patient's chart and labs.  Questions were answered to the patient's satisfaction.    Cath Lab Visit (complete for each Cath Lab visit)  Clinical Evaluation Leading to the Procedure:   ACS: no  Non-ACS:    Anginal Classification: CCS III  Anti-ischemic medical therapy: Minimal Therapy (1 class of medications)  Non-Invasive Test Results: No non-invasive testing performed  Prior CABG: No previous CABG       Theron Arista [facility] 04/17/2013 10:56 AM

## 2013-04-17 NOTE — H&P (View-Only) (Signed)
 SUBJECTIVE: The patient is doing well today.  At this time, she denies chest pain, shortness of breath, or any new concerns.  She states her symptoms have improved since receiving transfusion.  She continues to have SOB.  EGD results are reviewed.   CURRENT MEDICATIONS: . amLODipine  10 mg Oral Daily  . atorvastatin  80 mg Oral q1800  . benazepril  40 mg Oral Daily  . dipyridamole-aspirin  1 capsule Oral Daily  . DULoxetine  60 mg Oral Daily  . ferrous fumarate  1 tablet Oral TID  . glimepiride  4 mg Oral Q breakfast  . insulin aspart  0-15 Units Subcutaneous TID WC  . insulin aspart  0-5 Units Subcutaneous QHS  . levothyroxine  100 mcg Oral QAC breakfast  . linagliptin  5 mg Oral Daily  . pantoprazole  40 mg Oral BID   . sodium chloride 50 mL/hr at 04/15/13 1423  . sodium chloride      OBJECTIVE: Physical Exam: Filed Vitals:   04/15/13 1233 04/15/13 1420 04/15/13 2100 04/16/13 0500  BP: 155/70 149/47 134/34 125/53  Pulse:   46 74  Temp:  98.5 F (36.9 C) 97.9 F (36.6 C) 97.9 F (36.6 C)  TempSrc:  Oral    Resp: 19 18 20 18  Height:      Weight:      SpO2: 98% 97% 96% 95%    Intake/Output Summary (Last 24 hours) at 04/16/13 0844 Last data filed at 04/15/13 2100  Gross per 24 hour  Intake    440 ml  Output      0 ml  Net    440 ml    Telemetry reveals sinus rhythm with 1st degree AV block  GEN- The patient is well appearing, alert and oriented x 3 today.   Head- normocephalic, atraumatic Eyes-  Sclera clear, conjunctiva pink Ears- hearing intact Oropharynx- clear Neck- supple, no JVP Lymph- no cervical lymphadenopathy Lungs- Clear to ausculation bilaterally, normal work of breathing Heart- Regular rate and rhythm, 2/6 SEM LUSB (early to mid peaking) GI- soft, NT, ND, + BS Extremities- no clubbing, cyanosis, or edema Skin- no rash or lesion Psych- euthymic mood, full affect Neuro- strength and sensation are intact  LABS: Basic Metabolic  Panel:  Recent Labs  04/16/13 0400  NA 140  K 4.1  CL 106  CO2 23  GLUCOSE 147*  BUN 13  CREATININE 0.76  CALCIUM 9.2   Liver Function Tests: No results found for this basename: AST, ALT, ALKPHOS, BILITOT, PROT, ALBUMIN,  in the last 72 hours No results found for this basename: LIPASE, AMYLASE,  in the last 72 hours CBC:  Recent Labs  04/15/13 0450 04/16/13 0400  WBC 6.1 5.3  HGB 8.5* 8.9*  HCT 27.8* 29.8*  MCV 82.7 83.7  PLT 211 212  Fasting Lipid Panel:  Recent Labs  04/15/13 0450  CHOL 104  HDL 36*  LDLCALC 49  TRIG 95  CHOLHDL 2.9   .  ASSESSMENT AND PLAN:   1. Shortness of breath/ jaw pain- likely precipitated by profound anemia.  She is improving s/p PRBCs.  I agree with Dr Katz that cath is necessary for further risk stratification after the cause for her GI bleeding /anemia have been determined.  Will proceed with diagnostic cath tomorrow.  Given GI findings, I am not sure that we would want to intervene at this time unless absolutely neceesary.  Echo pending Continue current medical therapy for now  2. Mobitz   I second degree AV block- chronic and asymptomatic, no further workup planned  3. Anemia/ GI bleeding- per GI.  Capsule endoscopy planned.  She is off of aggrenox now.  She is instructed to follow-up with neurology as an outpatient to see if she requires any further therapy for prior TIAs.  

## 2013-04-18 ENCOUNTER — Encounter (HOSPITAL_COMMUNITY)
Admission: AD | Disposition: A | Discharge status: Home / Self Care | Payer: Self-pay | Source: Other Acute Inpatient Hospital | Attending: Internal Medicine

## 2013-04-18 DIAGNOSIS — E1169 Type 2 diabetes mellitus with other specified complication: Secondary | ICD-10-CM | POA: Diagnosis present

## 2013-04-18 DIAGNOSIS — K922 Gastrointestinal hemorrhage, unspecified: Secondary | ICD-10-CM | POA: Diagnosis present

## 2013-04-18 DIAGNOSIS — E669 Obesity, unspecified: Secondary | ICD-10-CM | POA: Diagnosis present

## 2013-04-18 DIAGNOSIS — R0609 Other forms of dyspnea: Secondary | ICD-10-CM

## 2013-04-18 DIAGNOSIS — I10 Essential (primary) hypertension: Secondary | ICD-10-CM | POA: Diagnosis present

## 2013-04-18 DIAGNOSIS — D62 Acute posthemorrhagic anemia: Secondary | ICD-10-CM

## 2013-04-18 DIAGNOSIS — E119 Type 2 diabetes mellitus without complications: Secondary | ICD-10-CM

## 2013-04-18 DIAGNOSIS — I441 Atrioventricular block, second degree: Secondary | ICD-10-CM

## 2013-04-18 HISTORY — PX: GIVENS CAPSULE STUDY: SHX5432

## 2013-04-18 LAB — CBC
Hemoglobin: 8.9 g/dL — ABNORMAL LOW (ref 12.0–15.0)
MCHC: 29.9 g/dL — ABNORMAL LOW (ref 30.0–36.0)
Platelets: 226 10*3/uL (ref 150–400)
RDW: 17.8 % — ABNORMAL HIGH (ref 11.5–15.5)

## 2013-04-18 LAB — GLUCOSE, CAPILLARY: Glucose-Capillary: 137 mg/dL — ABNORMAL HIGH (ref 70–99)

## 2013-04-18 SURGERY — IMAGING PROCEDURE, GI TRACT, INTRALUMINAL, VIA CAPSULE
Anesthesia: LOCAL

## 2013-04-18 MED ORDER — SODIUM CHLORIDE 0.9 % IV SOLN: INTRAVENOUS | Status: DC

## 2013-04-18 SURGICAL SUPPLY — 1 items: TOWEL COTTON PACK 4EA (MISCELLANEOUS) ×4 IMPLANT

## 2013-04-18 NOTE — Care Management Note (Signed)
    Page 1 of 1   04/18/2013     11:38:51 AM   CARE MANAGEMENT NOTE 04/18/2013  Patient:  Melissa Becker, Melissa Becker   Account Number:  [id number]  Date Initiated:  04/18/2013  Documentation initiated by:  GRAVES-BIGELOW,Simeon Vera  Subjective/Objective Assessment:   Pt admitted for SOB/ jaw pain- likely precipitated by profound anemia. Per MD notes she is improving s/p PRBCs. Cardiac cath shows no significant CAD and Echo shows normal EF without significant vavlular disease. Capsule endoscopy today.     Action/Plan:   CM will continue to monitor for disposition needs.   Anticipated DC Date:  04/19/2013   Anticipated DC Plan:  HOME/SELF CARE      DC Planning Services  CM consult      Choice offered to / List presented to:             Status of service:  Completed, signed off Medicare Important Message given?   (If response is "NO", the following Medicare IM given date fields will be blank) Date Medicare IM given:   Date Additional Medicare IM given:    Discharge Disposition:  HOME/SELF CARE  Per UR Regulation:  Reviewed for med. necessity/level of care/duration of stay  If discussed at Long Length of Stay Meetings, dates discussed:    Comments:

## 2013-04-18 NOTE — Progress Notes (Signed)
   TELEMETRY: Reviewed telemetry pt in NSR with intermittent Mobitz type 1 AV block. No significant pauses.: Filed Vitals:   05/15/2013 1400 May 15, 2013 1430 05/15/13 2046 04/18/13 0515  BP: 139/52 130/67 160/48 151/43  Pulse: 54 58 65 59  Temp:   98.4 F (36.9 C) 98.5 F (36.9 C)  TempSrc:   Oral Oral  Resp:   20 18  Height:      Weight:      SpO2: 95% 95% 96% 93%   No intake or output data in the 24 hours ending 04/18/13 0854  SUBJECTIVE Feels well this am. Breathing is better. No chest pain. No evidence of bleeding.   LABS: Basic Metabolic Panel:  Recent Labs  11/91/47 0400 05-15-13 0521  NA 140 138  K 4.1 3.9  CL 106 104  CO2 23 23  GLUCOSE 147* 156*  BUN 13 14  CREATININE 0.76 0.83  CALCIUM 9.2 9.0   CBC:  Recent Labs  05/15/2013 0521 04/18/13 0500  WBC 5.8 5.7  HGB 8.6* 8.9*  HCT 28.4* 29.8*  MCV 84.0 85.4  PLT 204 226    Radiology/Studies:  No results found.  Ecg 05-16-2023- NSR with Mobitz type I AV block. RBBB.  PHYSICAL EXAM General: Well developed, obese, in no acute distress. Head: Normal Neck: Negative for carotid bruits. JVD not elevated. Lungs: Clear bilaterally to auscultation without wheezes, rales, or rhonchi. Breathing is unlabored. Heart: RRR S1 S2 without murmurs, rubs, or gallops.  Abdomen: Soft, non-tender, non-distended with normoactive bowel sounds. No hepatomegaly. No obvious abdominal masses. Msk:  Strength and tone appears normal for age. Extremities: No clubbing, cyanosis or edema.  Distal pedal pulses are 2+ and equal bilaterally. Neuro: Alert and oriented X 3. Moves all extremities spontaneously. Psych:  Responds to questions appropriately with a normal affect.  ASSESSMENT AND PLAN: 1. Dyspnea- secondary to severe anemia. Cardiac cath shows no significant CAD and Echo shows normal EF without significant vavlular disease. Breathing improved post transfusion. 2. Mobitz type 1 second degree AV block. No significant pauses. No  symptoms. Patient did develop transient complete heart block during cardiac cath related to pigtail catheter hitting the Left bundle (in setting of RBBB). No indication for pacemaker. Need to avoid any rate slowing medications. 3. Anemia. Hgb stable post transfusion. 4. GI bleed- capsule endoscopy in progress. Will await GI recommendations. 5. DM type 2 6. HTN.  Principal Problem:   Dyspnea Active Problems:   Anemia due to acute blood loss   GI bleed   Mobitz type 1 second degree AV block   HTN (hypertension)   Diabetes mellitus type 2 in obese    Signed, Peter [place] MD,FACC 04/18/2013 8:54 AM

## 2013-04-19 ENCOUNTER — Encounter (HOSPITAL_COMMUNITY): Payer: Self-pay | Admitting: Gastroenterology

## 2013-04-19 DIAGNOSIS — I10 Essential (primary) hypertension: Secondary | ICD-10-CM

## 2013-04-19 LAB — GLUCOSE, CAPILLARY
Glucose-Capillary: 153 mg/dL — ABNORMAL HIGH (ref 70–99)
Glucose-Capillary: 113 mg/dL — ABNORMAL HIGH (ref 70–99)
Glucose-Capillary: 90 mg/dL (ref 70–99)

## 2013-04-19 LAB — CBC
HCT: 28.9 % — ABNORMAL LOW (ref 36.0–46.0)
MCH: 25.4 pg — ABNORMAL LOW (ref 26.0–34.0)
MCHC: 29.8 g/dL — ABNORMAL LOW (ref 30.0–36.0)
MCV: 85.5 fL (ref 78.0–100.0)
Platelets: 212 10*3/uL (ref 150–400)
RDW: 18 % — ABNORMAL HIGH (ref 11.5–15.5)

## 2013-04-19 MED ORDER — PANTOPRAZOLE SODIUM 40 MG PO TBEC: 40.0000 mg | DELAYED_RELEASE_TABLET | Freq: Two times a day (BID) | ORAL | Status: DC

## 2013-04-19 MED ORDER — FERROUS FUMARATE 325 (106 FE) MG PO TABS: 1.0000 | ORAL_TABLET | Freq: Three times a day (TID) | ORAL | Status: DC

## 2013-04-19 NOTE — Brief Op Note (Signed)
04/14/2013 - 04/18/2013  11:53 AM  PATIENT:  Melissa Becker  67 y.o. female  PRE-OPERATIVE DIAGNOSIS:  melena  POST-OPERATIVE DIAGNOSIS:  * No post-op diagnosis entered *  PROCEDURE:  Procedure(s): GIVENS CAPSULE STUDY (N/A)  Small bowel capsule study done and read. This was a normal study regarding the small intestine with no bleeding and no potential bleeding sources seen.  Recommend observe tools and hemoglobin for GI blood loss going forward and evaluate as needed.

## 2013-04-19 NOTE — Progress Notes (Signed)
   TELEMETRY: Reviewed telemetry pt in NSR with intermittent Mobitz type 1 AV block, PVCs. No significant pauses.: Filed Vitals:   04/18/13 0515 04/18/13 1400 04/18/13 2048 04/19/13 0524  BP: 151/43 150/40 159/73 135/47  Pulse: 59 76 71 63  Temp: 98.5 F (36.9 C) 97.9 F (36.6 C) 98.1 F (36.7 C) 98.3 F (36.8 C)  TempSrc: Oral Oral Oral Oral  Resp: 18 20 18 18   Height:      Weight:      SpO2: 93% 95% 93% 95%    Intake/Output Summary (Last 24 hours) at 04/19/13 0726 Last data filed at 04/18/13 1700  Gross per 24 hour  Intake    240 ml  Output      0 ml  Net    240 ml    SUBJECTIVE Feels well this am. Still dyspneic with exertion. No chest pain. No evidence of bleeding.   LABS: Basic Metabolic Panel:  Recent Labs  45/40/98 0521  NA 138  K 3.9  CL 104  CO2 23  GLUCOSE 156*  BUN 14  CREATININE 0.83  CALCIUM 9.0   CBC:  Recent Labs  04/18/13 0500 04/19/13 0511  WBC 5.7 5.7  HGB 8.9* 8.6*  HCT 29.8* 28.9*  MCV 85.4 85.5  PLT 226 212    Radiology/Studies:  No results found.  Ecg 2023-05-05- NSR with Mobitz type I AV block. RBBB.  PHYSICAL EXAM General: Well developed, obese, in no acute distress. Head: Normal Neck: Negative for carotid bruits. JVD not elevated. Lungs: Clear bilaterally to auscultation without wheezes, rales, or rhonchi. Breathing is unlabored. Heart: RRR S1 S2 without murmurs, rubs, or gallops.  Abdomen: Soft, non-tender, non-distended with normoactive bowel sounds. No hepatomegaly. No obvious abdominal masses. Msk:  Strength and tone appears normal for age. Extremities: No clubbing, cyanosis or edema.  Distal pedal pulses are 2+ and equal bilaterally. Neuro: Alert and oriented X 3. Moves all extremities spontaneously. Psych:  Responds to questions appropriately with a normal affect.  ASSESSMENT AND PLAN: 1. Dyspnea- secondary to severe anemia. Cardiac cath shows no significant CAD and Echo shows normal EF without significant vavlular  disease. Breathing improved post transfusion. 2. Mobitz type 1 second degree AV block. No significant pauses. No symptoms. Patient did develop transient complete heart block during cardiac cath related to pigtail catheter hitting the Left bundle (in setting of RBBB). No indication for pacemaker. Need to avoid any rate slowing medications. 3. Anemia. Hgb stable post transfusion. 4. GI bleed- capsule endoscopy performed. Will await GI recommendations. 5. DM type 2 6. HTN.   Patient is stable for discharge today from a cardiac standpoint. Will check with GI to get their recommendations.  Principal Problem:   Dyspnea Active Problems:   Anemia due to acute blood loss   GI bleed   Mobitz type 1 second degree AV block   HTN (hypertension)   Diabetes mellitus type 2 in obese    Signed, Bronco Mcgrory [place] MD,FACC 04/19/2013 7:26 AM

## 2013-04-19 NOTE — Progress Notes (Signed)
Small bowel capsule study done and read. This was a normal study regarding the small intestine with no bleeding and no potential bleeding sources seen.  Recommend observe tools and hemoglobin for GI blood loss going forward and evaluate as needed.we'll sign off for now

## 2013-04-19 NOTE — Discharge Summary (Signed)
CARDIOLOGY DISCHARGE SUMMARY   Patient ID: Melissa Becker MRN: [id number] DOB/AGE: 1946-02-14 67 y.o.  Admit date: 04/14/2013 Discharge date: 04/19/2013  Primary Discharge Diagnosis:     Dyspnea Secondary Discharge Diagnosis:    Anemia due to acute blood loss   GI bleed   Mobitz type 1 second degree AV block   HTN (hypertension   Diabetes mellitus type 2 in obese   Complete heart block (brief, during attempted LV gram).  Consults: GI  Procedures: capsule endoscopy, Left Heart Cath, Selective Coronary Angiography  Hospital Course: Melissa Becker is a 67 y.o. female with no history of CAD. She went to [facility] with dyspnea and was seen there by Dr. Myrtis Ser. She was profoundly anemic. He recommended transfer to [facility] cone for further evaluation and treatment.  Dr. Myrtis Ser and Dr. Johney Frame evaluated Melissa Becker and felt that her shortness of breath and jaw pain were likely precipitated by profound anemia. She had been on Aggrenox prior to admission and this was discontinued. She was transfused at [facility] so her hemoglobin had actually improved by the time she arrived at [facility]. Her hemoglobin on admission at [facility] was 8.5 with a hematocrit of 27.8. She was having no further discomfort and felt stable to proceed with a GI workup. It was felt best to evaluate her GI issues prior to cardiac catheterization.  She was seen by Dr. Randa Evens with gastroenterology and full results are below. For further evaluation and possibly got treatment, it was recommended she have a capsule endoscopy. She was given a capsule endoscopy and it was read on 04/19/2013. The results are below but no further inpatient workup was indicated.  She remained pain-free and was stable for cardiac catheterization on 04/17/2013. The results are below, and medical therapy was recommended for nonobstructive disease.  Of note, when the pigtail was placed in the left ventricle, she developed complete heart block with frequent  ventricular ectopy. Therefore, LV angiography was not performed. On admission she had been in second degree heart block Mobitz 1. She maintained her blood pressure and baseline heart rate with this. She should not be on any rate lowering medications but at this time a pacemaker is not indicated.  On 04/20/2013, Melissa Becker was seen by Dr. Swazil[place] and by Dr. Madilyn Fireman. She was ambulating without chest pain or shortness of breath. Her hemoglobin and hematocrit were stable. Dr. Madilyn Fireman cleared her for discharge and Dr. Swaziland considered her stable for discharge, in improved condition, to followup as an outpatient.  Labs:   Lab Results  Component Value Date   WBC 5.7 04/19/2013   HGB 8.6* 04/19/2013   HCT 28.9* 04/19/2013   MCV 85.5 04/19/2013   PLT 212 04/19/2013     Recent Labs Lab 04/17/13 0521  NA 138  K 3.9  CL 104  CO2 23  BUN 14  CREATININE 0.83  CALCIUM 9.0  GLUCOSE 156*   Lipid Panel     Component Value Date/Time   CHOL 104 04/15/2013 0450   TRIG 95 04/15/2013 0450   HDL 36* 04/15/2013 0450   CHOLHDL 2.9 04/15/2013 0450   VLDL 19 04/15/2013 0450   LDLCALC 49 04/15/2013 0450    Recent Labs  04/17/13 0521  INR 1.02      Radiology: 04/19/2013  PROCEDURE: Procedure(s):  GIVENS CAPSULE STUDY (N/A)  Small bowel capsule study done and read. This was a normal study regarding the small intestine with no bleeding and no potential bleeding sources seen.  Recommend observe tools and hemoglobin for GI blood loss going forward and evaluate as needed.   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  RECOMMENDATIONS: The 2 focal angiodysplastic lesions that were seen  on this examination were not bleeding. Angiodysplasia however is  most likely the cause of her melena and anemia. The 2 that were  seen at this time were not cauterized because of recent Aggrenox  use. Whether or not cauterization of these would be of benefit is  unclear  simply because these 2 that were seen may just be the tip  of the iceberg and that she could have multiple scattered  angiodysplasia throughout her small bowel as well as her colon  which has never been able to be adequately visualized do to  inability to perform a complete colonoscopy.I think it would be  reasonable to do a capsule endoscopy to evaluate the rest of her  small bowel. If she did not have multiple angiodysplasia throughout  her small bowel then we could attempt argon plasma coagulation of  the 2 angiodysplasia that were seen on this examination just to see  if it prevented her from having further episodes of bleeding.   Cardiac Cath: 04/17/2013 Left mainstem: Normal.  Left anterior descending (LAD): diffuse 20% narrowing proximally.  Left circumflex (LCx): 30% diffuse disease proximally.  Right coronary artery (RCA): large dominant. 20-30% disease in the proximal and mid RCA.  Left ventriculography: LV gram was not done. When pigtail was placed in the LV the patient developed complete heart block, with frequent ventricular ectopy. Therefore, I did not hazard LV angiography. Patient returned to her Wenkebach AV block.  Final Conclusions:  1. Nonobstructive CAD  2. Mild AV gradient. This may not be completely accurate due to heart block.  Recommendations: Medical management.   EKG: 17-Apr-2013 05:13:13 [facility] ROUTINE RECORD Sinus rhythm with 2nd degree A-V block (Mobitz I) with 2:1 A-V conduction Right bundle branch block Abnormal ECG 60mm/s 68mm/mV 100Hz  8.0.1 12SL 239 CID: 21 Referred by: BENSIMHON DANIEL R Unconfirmed Vent. rate 55 BPM PR interval * ms QRS duration 146 ms QT/QTc 460/440 ms P-R-T axes 39 -29 16  Echo: 04/15/2013 Study Conclusions - Left ventricle: The cavity size was normal. Systolic function was normal. The estimated ejection fraction was in the range of 55% to 60%. Wall motion was normal; there were no regional wall  motion abnormalities. Doppler parameters are consistent with abnormal left ventricular relaxation (grade 1 diastolic dysfunction). - Aortic valve: Cusp separation was mildly reduced. There was mild stenosis. Valve area: 1.47cm^2(VTI). Valve area: 1.49cm^2 (Vmax). - Mitral valve: Mild regurgitation. - Left atrium: The atrium was mildly dilated. - Right ventricle: The cavity size was mildly dilated. Wall thickness was normal.   FOLLOW UP PLANS AND APPOINTMENTS Allergies  Allergen Reactions  . Latex Itching     Medication List    STOP taking these medications       dipyridamole-aspirin 200-25 MG per 12 hr capsule  Commonly known as:  AGGRENOX     omeprazole 20 MG capsule  Commonly known as:  PRILOSEC      TAKE these medications       amLODipine 10 MG tablet  Commonly known as:  NORVASC  Take 10 mg by mouth every morning.     atorvastatin 80 MG tablet  Commonly known as:  LIPITOR  Take 80 mg by mouth every evening.     benazepril 40 MG  tablet  Commonly known as:  LOTENSIN  Take 40 mg by mouth every morning.     DULoxetine 60 MG capsule  Commonly known as:  CYMBALTA  Take 60 mg by mouth every evening.     ferrous fumarate 325 (106 FE) MG Tabs tablet  Commonly known as:  HEMOCYTE - 106 mg FE  Take 1 tablet (106 mg of iron total) by mouth 3 (three) times daily.     glimepiride 4 MG tablet  Commonly known as:  AMARYL  Take 4 mg by mouth 2 (two) times daily.     HYDROcodone-acetaminophen 5-325 MG per tablet  Commonly known as:  NORCO/VICODIN  Take 1 tablet by mouth every 6 (six) hours as needed for pain. For pain     levothyroxine 100 MCG tablet  Commonly known as:  SYNTHROID, LEVOTHROID  Take 100 mcg by mouth daily before breakfast.     metFORMIN 1000 MG tablet  Commonly known as:  GLUCOPHAGE  Take 1,000 mg by mouth 2 (two) times daily with a meal.     pantoprazole 40 MG tablet  Commonly known as:  PROTONIX  Take 1 tablet (40 mg total) by mouth 2 (two)  times daily.     TRADJENTA 5 MG Tabs tablet  Generic drug:  linagliptin  Take 5 mg by mouth every morning.        Discharge Orders   Future Appointments Provider Department Dept Phone   05/10/2013 1:00 PM Dyann Kief, PA-C [facility] Main Office [place]) [phone]   Future Orders Complete By Expires   Diet - low sodium heart healthy  As directed    Diet Carb Modified  As directed    Increase activity slowly  As directed      Follow-up Information   Follow up with Jacolyn Reedy, PA-C On 05/10/2013. (at 1:00 pm)    Specialty:  Cardiology   Contact information:   [address] [place], [place] [address] [place] [address] [phone]       Schedule an appointment as soon as possible for a visit with EDWARDS Burna Mortimer, MD.   Specialty:  Gastroenterology   Contact information:   [address]                         [place] [address] [phone]       BRING ALL MEDICATIONS WITH YOU TO FOLLOW UP APPOINTMENTS  Time spent with patient to include physician time: 43 min Signed: Theodore Demark, PA-C 04/19/2013, 3:45 PM Co-Sign MD

## 2013-04-19 NOTE — Progress Notes (Signed)
Met with Melissa Becker to evaluate for [facility] Care Management services. Melissa Becker reports she lives with her husband and is independent. Will receive post hospital discharge call. Confirmed best contact information. Appreciative of visit. Made inpatient RNCM aware of visit as well. Left contact information with patient. Hal Morales- [facility] [phone]

## 2013-04-19 NOTE — Discharge Summary (Signed)
Patient seen and examined and history reviewed. Agree with above findings and plan. See earlier rounding note.  Melissa Becker 04/19/2013 4:18 PM

## 2013-05-10 ENCOUNTER — Ambulatory Visit (INDEPENDENT_AMBULATORY_CARE_PROVIDER_SITE_OTHER): Payer: Medicare Other | Admitting: Physician Assistant

## 2013-05-10 ENCOUNTER — Encounter: Payer: Self-pay | Admitting: Physician Assistant

## 2013-05-10 VITALS — BP 158/56 | HR 50 | Wt 254.0 lb

## 2013-05-10 DIAGNOSIS — I441 Atrioventricular block, second degree: Secondary | ICD-10-CM

## 2013-05-10 DIAGNOSIS — Z8673 Personal history of transient ischemic attack (TIA), and cerebral infarction without residual deficits: Secondary | ICD-10-CM

## 2013-05-10 DIAGNOSIS — I10 Essential (primary) hypertension: Secondary | ICD-10-CM

## 2013-05-10 NOTE — Assessment & Plan Note (Signed)
Patient currently has sinus bradycardia at 50 beats per minute with right bundle branch block. She is not to take rate lowering medications. It should be noted that when the pigtail was placed in LV she developed complete heart block with frequent ventricular ectopy therefore LV angiogram was not performed. She has had no symptoms with her bradycardia. Continue to monitor.

## 2013-05-10 NOTE — Assessment & Plan Note (Signed)
Patient was on Aggrenox for this. This was stopped due to to GI bleed. I've asked her to contact her neurologist as well as Dr. Randa Evens concerning further treatment.

## 2013-05-10 NOTE — Assessment & Plan Note (Signed)
Blood pressure is elevated today but she says it does go up when she sees the doctor and it is usually okay at home. I've asked her to cut back on her salt and keep a record of her blood pressures to bring when she sees Dr. Myrtis Ser. She can also followup with Dr.Nyland concerning this.

## 2013-05-10 NOTE — Progress Notes (Signed)
HPI:  This is a 67 year old female patient who was seen by Dr. Myrtis Ser in the evening with shortness of breath and jaw pain and was found to be profoundly anemic. She was transfused and transferred to:[facility] She underwent cardiac catheterization that showed nonobstructive CAD. When the pigtail was placed in the LV she developed complete heart block with frequent ventricular ectopy every 4 LV angiogram was not performed. On admission she had second-degree heart block Mobitz 1. 2-D echo showed normal LV function EF 55-60% with mild aortic stenosis and mild mitral regurgitation. Endoscopy showed 2 focal angiodysplastic lesions that were not bleeding however were felt to be the cause of her melena and anemia.  The patient is feeling better but still is quite fatigued. She said her hemoglobin was 9.9 last week. She's wondering if she should start aspirin for her TIAs since she is no longer allowed to take Aggrenox. She denies any chest pain, dyspnea, palpitations, dizziness, or presyncope. Allergies  -- Latex -- Itching  Current Outpatient Prescriptions on File Prior to Visit: amLODipine (NORVASC) 10 MG tablet, Take 10 mg by mouth every morning., Disp: , Rfl:  atorvastatin (LIPITOR) 80 MG tablet, Take 80 mg by mouth every evening., Disp: , Rfl:  benazepril (LOTENSIN) 40 MG tablet, Take 40 mg by mouth every morning., Disp: , Rfl:  DULoxetine (CYMBALTA) 60 MG capsule, Take 60 mg by mouth every evening., Disp: , Rfl:  ferrous fumarate (HEMOCYTE - 106 MG FE) 325 (106 FE) MG TABS tablet, Take 1 tablet (106 mg of iron total) by mouth 3 (three) times daily., Disp: 90 each, Rfl: 1 glimepiride (AMARYL) 4 MG tablet, Take 4 mg by mouth 2 (two) times daily., Disp: , Rfl:  HYDROcodone-acetaminophen (NORCO/VICODIN) 5-325 MG per tablet, Take 1 tablet by mouth every 6 (six) hours as needed for pain. For pain, Disp: , Rfl:  levothyroxine (SYNTHROID, LEVOTHROID) 100 MCG tablet, Take 100 mcg by mouth daily before breakfast., Disp:  , Rfl:  linagliptin (TRADJENTA) 5 MG TABS tablet, Take 5 mg by mouth every morning. , Disp: , Rfl:  metFORMIN (GLUCOPHAGE) 1000 MG tablet, Take 1,000 mg by mouth 2 (two) times daily with a meal., Disp: , Rfl:  pantoprazole (PROTONIX) 40 MG tablet, Take 1 tablet (40 mg total) by mouth 2 (two) times daily., Disp: 60 tablet, Rfl: 2  No current facility-administered medications on file prior to visit.   Past Medical History:   Hypothyroid                                                  Type 2 diabetes mellitus                                     Hypertension                                                 Cerebrovascular disease                                        Comment:history of TIAs  Neuropathy                                                  Past Surgical History:   TONSILLECTOMY                                                 ABDOMINAL HYSTERECTOMY                                        CHOLECYSTECTOMY                                               VENTRAL HERNIA REPAIR                                         ESOPHAGOGASTRODUODENOSCOPY                      N/A 04/15/2013       Comment:Procedure: ESOPHAGOGASTRODUODENOSCOPY (EGD);                Surgeon: Graylin Shiver, MD;  Location: [facility]               ENDOSCOPY;  Service: Endoscopy;  Laterality:               N/A;   GIVENS CAPSULE STUDY                            N/A 04/18/2013      Comment:Procedure: GIVENS CAPSULE STUDY;  Surgeon:               Graylin Shiver, MD;  Location: [facility] ENDOSCOPY;                Service: Endoscopy;  Laterality: N/A;  Review of patient's family history indicates:   Colon cancer                   Father                   Social History   Marital Status: Married             Spouse Name:                      Years of Education:                 Number of children:             Occupational History   None on file  Social History Main Topics   Smoking Status: Never Smoker                     Smokeless  Status: Not on file  Alcohol Use: No             Drug Use: No             Sexual Activity: Not on file        Other Topics            Concern   None on file  Social History Narrative   None on file    ROS: See history of present illness otherwise negative   PHYSICAL EXAM: Obese, in no acute distress. Neck: No JVD, HJR, Bruit, or thyroid enlargement  Lungs: No tachypnea, clear without wheezing, rales, or rhonchi  Cardiovascular: RRR, PMI not displaced, 2-3/6 harsh systolic murmur at the left sternal border radiating to the carotids, 1/6 systolic murmur at the apex, otherwise no gallops, bruit, thrill, or heave.  Abdomen: BS normal. Soft without organomegaly, masses, lesions or tenderness.  Extremities: without cyanosis, clubbing or edema. 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   RECOMMENDATIONS: The 2 focal angiodysplastic lesions that were seen   on this examination were not bleeding. Angiodysplasia however is   most likely the cause of her melena and anemia. The 2 that were   seen at this time were not cauterized because of recent Aggrenox   use. Whether or not cauterization of these would be of benefit is   unclear simply because these 2 that were seen may just be the tip   of the iceberg and that she could have multiple scattered   angiodysplasia throughout her small bowel as well as her colon   which has never been able to be adequately visualized do to   inability to perform a complete colonoscopy.I think it would be   reasonable to do a capsule endoscopy to evaluate the rest of her   small bowel. If she did not  have multiple angiodysplasia throughout   her small bowel then we could attempt argon plasma coagulation of   the 2 angiodysplasia that were seen on this examination just to see   if it prevented her from having further episodes of bleeding.   Cardiac Cath: 04/17/2013 Left mainstem: Normal.   Left anterior descending (LAD): diffuse 20% narrowing proximally.   Left circumflex (LCx): 30% diffuse disease proximally.   Right coronary artery (RCA): large dominant. 20-30% disease in the proximal and mid RCA.   Left ventriculography: LV gram was not done. When pigtail was placed in the LV the patient developed complete heart block, with frequent ventricular ectopy. Therefore, I did not hazard LV angiography. Patient returned to her Wenkebach AV block.   Final Conclusions:   1. Nonobstructive CAD   2. Mild AV gradient. This may not be completely accurate due to heart block.   Recommendations: Medical management  Echo: 04/15/2013  Study Conclusions - Left ventricle: The cavity size was normal. Systolic function was normal. The estimated ejection fraction was in the range of 55% to 60%. Wall motion was normal; there were no regional wall motion abnormalities. Doppler parameters are consistent with abnormal left ventricular relaxation (grade 1 diastolic dysfunction). - Aortic valve: Cusp separation was mildly reduced. There was mild stenosis. Valve  area: 1.47cm^2(VTI). Valve area: 1.49cm^2 (Vmax). - Mitral valve: Mild regurgitation. - Left atrium: The atrium was mildly dilated. - Right ventricle: The cavity size was mildly dilated. Wall thickness was normal.

## 2013-05-10 NOTE — Patient Instructions (Signed)
Your physician recommends that you schedule a follow-up appointment in:  2 MONTHS WITH DR Myrtis Ser  IN [facility] OFFICE  Your physician recommends that you continue on your current medications as directed. Please refer to the Current Medication list given to you today.

## 2013-07-13 ENCOUNTER — Other Ambulatory Visit: Payer: Self-pay

## 2013-07-19 ENCOUNTER — Other Ambulatory Visit (HOSPITAL_COMMUNITY): Payer: Self-pay | Admitting: Physician Assistant

## 2013-07-27 ENCOUNTER — Other Ambulatory Visit (HOSPITAL_COMMUNITY): Payer: Self-pay | Admitting: Physician Assistant

## 2013-08-24 ENCOUNTER — Encounter: Payer: Self-pay | Admitting: Cardiology

## 2013-08-24 ENCOUNTER — Other Ambulatory Visit (HOSPITAL_COMMUNITY): Payer: Self-pay | Admitting: Gastroenterology

## 2013-08-24 DIAGNOSIS — Z91040 Latex allergy status: Secondary | ICD-10-CM | POA: Insufficient documentation

## 2013-08-24 DIAGNOSIS — I2510 Atherosclerotic heart disease of native coronary artery without angina pectoris: Secondary | ICD-10-CM | POA: Insufficient documentation

## 2013-08-24 DIAGNOSIS — I350 Nonrheumatic aortic (valve) stenosis: Secondary | ICD-10-CM | POA: Insufficient documentation

## 2013-08-24 DIAGNOSIS — K921 Melena: Secondary | ICD-10-CM

## 2013-08-24 DIAGNOSIS — I4510 Unspecified right bundle-branch block: Secondary | ICD-10-CM | POA: Insufficient documentation

## 2013-08-24 DIAGNOSIS — I340 Nonrheumatic mitral (valve) insufficiency: Secondary | ICD-10-CM | POA: Insufficient documentation

## 2013-08-24 DIAGNOSIS — G459 Transient cerebral ischemic attack, unspecified: Secondary | ICD-10-CM | POA: Insufficient documentation

## 2013-08-24 DIAGNOSIS — R9430 Abnormal result of cardiovascular function study, unspecified: Secondary | ICD-10-CM | POA: Insufficient documentation

## 2013-08-28 ENCOUNTER — Encounter: Payer: Self-pay | Admitting: Cardiology

## 2013-08-28 ENCOUNTER — Ambulatory Visit (INDEPENDENT_AMBULATORY_CARE_PROVIDER_SITE_OTHER): Payer: Medicare Other | Admitting: Cardiology

## 2013-08-28 VITALS — BP 135/76 | HR 78 | Ht 69.0 in | Wt 255.0 lb

## 2013-08-28 DIAGNOSIS — I350 Nonrheumatic aortic (valve) stenosis: Secondary | ICD-10-CM

## 2013-08-28 DIAGNOSIS — I359 Nonrheumatic aortic valve disorder, unspecified: Secondary | ICD-10-CM

## 2013-08-28 DIAGNOSIS — G459 Transient cerebral ischemic attack, unspecified: Secondary | ICD-10-CM

## 2013-08-28 DIAGNOSIS — I10 Essential (primary) hypertension: Secondary | ICD-10-CM

## 2013-08-28 DIAGNOSIS — I441 Atrioventricular block, second degree: Secondary | ICD-10-CM

## 2013-08-28 DIAGNOSIS — D62 Acute posthemorrhagic anemia: Secondary | ICD-10-CM

## 2013-08-28 DIAGNOSIS — I2510 Atherosclerotic heart disease of native coronary artery without angina pectoris: Secondary | ICD-10-CM

## 2013-08-28 NOTE — Progress Notes (Signed)
HPI  The patient is here today to followup overall cardiac status. She had been very ill in the hospital with profound anemia. At that time there was concern about her overall cardiac status. She underwent catheterization. She had mild nonobstructive disease. When the pigtail was placed in the LV, she developed brief complete heart block. This stabilize immediately. She then went back to intermittent type I second-degree AV block. Her EF was normal by echo. She does have mild valvular disease. She's had question of a TIA in the past. She had been on Aggrenox and this had to be stopped with her bleeding. It would be helpful to get her back on aspirin or Plavix but unfortunately she continues to have positive guaiac stools. She's going to have a bleeding study in the next few weeks. She's not having any chest pain or shortness of breath. There is been no syncope or presyncope.  Allergies  Allergen Reactions  . Latex Itching    Current Outpatient Prescriptions  Medication Sig Dispense Refill  . amLODipine (NORVASC) 10 MG tablet Take 10 mg by mouth every morning.      [facility] atorvastatin (LIPITOR) 80 MG tablet Take 80 mg by mouth every evening.      . benazepril (LOTENSIN) 40 MG tablet Take 40 mg by mouth every morning.      . DULoxetine (CYMBALTA) 60 MG capsule Take 60 mg by mouth every evening.      . fluticasone (FLONASE) 50 MCG/ACT nasal spray Place 2 sprays into both nostrils as needed. prn      . furosemide (LASIX) 20 MG tablet Take 20 mg by mouth as needed.      [facility] glimepiride (AMARYL) 4 MG tablet Take 4 mg by mouth daily.       [facility] HEMOCYTE 324 MG TABS tablet TAKE 1 TABLET (106 MG OF IRON TOTAL) BY MOUTH 3 (THREE) TIMES DAILY.  90 tablet  0  . HYDROcodone-acetaminophen (NORCO/VICODIN) 5-325 MG per tablet Take 1 tablet by mouth every 6 (six) hours as needed for pain. For pain      . levothyroxine (SYNTHROID, LEVOTHROID) 100 MCG tablet Take 100 mcg by mouth daily before breakfast.      .  linagliptin (TRADJENTA) 5 MG TABS tablet Take 5 mg by mouth every morning.       . metFORMIN (GLUCOPHAGE) 1000 MG tablet Take 1,000 mg by mouth 2 (two) times daily with a meal.      . pantoprazole (PROTONIX) 40 MG tablet TAKE 1 TABLET (40 MG TOTAL) BY MOUTH 2 (TWO) TIMES DAILY.  60 tablet  0   No current facility-administered medications for this visit.    History   Social History  . Marital Status: Married    Spouse Name: N/A    Number of Children: N/A  . Years of Education: N/A   Occupational History  . Not on file.   Social History Main Topics  . Smoking status: Never Smoker   . Smokeless tobacco: Not on file  . Alcohol Use: No  . Drug Use: No  . Sexual Activity: Not on file   Other Topics Concern  . Not on file   Social History Narrative  . No narrative on file    Family History  Problem Relation Age of Onset  . Colon cancer Father     Past Medical History  Diagnosis Date  . Hypothyroid   . Type 2 diabetes mellitus   . Hypertension   . TIA (transient  ischemic attack)     history of TIAs  . Neuropathy   . Ejection fraction   . Aortic stenosis   . Mitral regurgitation   . Latex allergy     Itching  . GI bleed   . CAD (coronary artery disease)     Past Surgical History  Procedure Laterality Date  . Tonsillectomy    . Abdominal hysterectomy    . Cholecystectomy    . Ventral hernia repair    . Esophagogastroduodenoscopy N/A 04/15/2013    Procedure: ESOPHAGOGASTRODUODENOSCOPY (EGD);  Surgeon: Graylin Shiver, MD;  Location: [facility] ENDOSCOPY;  Service: Endoscopy;  Laterality: N/A;  . Givens capsule study N/A 04/18/2013    Procedure: GIVENS CAPSULE STUDY;  Surgeon: Graylin Shiver, MD;  Location: [facility] ENDOSCOPY;  Service: Endoscopy;  Laterality: N/A;    Patient Active Problem List   Diagnosis Date Noted  . RBBB (right bundle branch block) 08/24/2013  . Ejection fraction   . Aortic stenosis   . Mitral regurgitation   . Latex allergy   . TIA (transient ischemic  attack)   . CAD (coronary artery disease)   . History of TIAs 05/10/2013  . Anemia due to acute blood loss 04/18/2013  . GI bleed 04/18/2013  . Mobitz type 1 second degree AV block 04/18/2013  . HTN (hypertension) 04/18/2013  . Diabetes mellitus type 2 in obese 04/18/2013    ROS   Patient denies fever, chills, headache, sweats, rash, change in vision, change in hearing, chest pain, cough, nausea vomiting, urinary symptoms. All other systems are reviewed and are negative.  PHYSICAL EXAM  Patient is overweight. She is stable. There is no jugulovenous distention. Lungs are clear. Respiratory effort is nonlabored. Cardiac exam reveals S1 and S2. She does have a crescendo decrescendo systolic murmur consistent with her aortic stenosis. The abdomen is soft. There is no peripheral edema.  Filed Vitals:   08/28/13 1325  BP: 135/76  Pulse: 78  Height: 5\' 9"  (1.753 m)  Weight: 255 lb (115.667 kg)    ASSESSMENT & PLAN

## 2013-08-28 NOTE — Assessment & Plan Note (Signed)
Her rhythm has been stable. She's not having any symptoms. No further workup at this time.

## 2013-08-28 NOTE — Assessment & Plan Note (Signed)
The patient will remain off antiplatelet medicines until there is a better understanding of her GI bleeding.

## 2013-08-28 NOTE — Assessment & Plan Note (Signed)
There is mild aortic stenosis and will be followed over time.

## 2013-08-28 NOTE — Assessment & Plan Note (Signed)
The patient's anemia workup is ongoing. She continues to have guaiac positive stool.

## 2013-08-28 NOTE — Patient Instructions (Signed)

## 2013-08-28 NOTE — Assessment & Plan Note (Signed)
Blood pressure is controlled. No change in therapy. 

## 2013-08-28 NOTE — Assessment & Plan Note (Signed)
Patient had minimal coronary disease by cath August, 2014. No further workup at this time. Aspirin will be used only if her GI status is completely stabilize.

## 2013-09-01 ENCOUNTER — Ambulatory Visit (HOSPITAL_COMMUNITY)
Admission: RE | Admit: 2013-09-01 | Discharge: 2013-09-01 | Disposition: A | Discharge status: Home / Self Care | Payer: Medicare Other | Source: Ambulatory Visit | Attending: Gastroenterology | Admitting: Gastroenterology

## 2013-09-01 DIAGNOSIS — D649 Anemia, unspecified: Secondary | ICD-10-CM | POA: Insufficient documentation

## 2013-09-01 DIAGNOSIS — K921 Melena: Secondary | ICD-10-CM

## 2013-09-01 DIAGNOSIS — K922 Gastrointestinal hemorrhage, unspecified: Secondary | ICD-10-CM | POA: Insufficient documentation

## 2013-09-01 MED ORDER — TECHNETIUM TC 99M-LABELED RED BLOOD CELLS IV KIT
25.0000 | PACK | Freq: Once | INTRAVENOUS | Status: AC | PRN
  Administered 2013-09-01: 25 via INTRAVENOUS

## 2013-09-19 ENCOUNTER — Other Ambulatory Visit: Payer: Self-pay | Admitting: *Deleted

## 2013-09-19 MED ORDER — PANTOPRAZOLE SODIUM 40 MG PO TBEC: 40.0000 mg | DELAYED_RELEASE_TABLET | Freq: Two times a day (BID) | ORAL | Status: AC

## 2013-11-13 ENCOUNTER — Other Ambulatory Visit: Payer: Self-pay | Admitting: Gastroenterology

## 2013-11-13 DIAGNOSIS — Z800 Family history of malignant neoplasm of digestive organs: Secondary | ICD-10-CM

## 2013-11-13 DIAGNOSIS — K921 Melena: Secondary | ICD-10-CM

## 2013-11-21 ENCOUNTER — Ambulatory Visit
Admission: RE | Admit: 2013-11-21 | Discharge: 2013-11-21 | Disposition: A | Discharge status: Home / Self Care | Payer: Medicare Other | Source: Ambulatory Visit | Attending: Gastroenterology | Admitting: Gastroenterology

## 2013-11-21 DIAGNOSIS — K921 Melena: Secondary | ICD-10-CM

## 2013-11-21 DIAGNOSIS — Z800 Family history of malignant neoplasm of digestive organs: Secondary | ICD-10-CM

## 2013-11-30 ENCOUNTER — Encounter (HOSPITAL_COMMUNITY): Payer: Self-pay | Admitting: Pharmacy Technician

## 2013-11-30 ENCOUNTER — Encounter (HOSPITAL_COMMUNITY): Payer: Self-pay | Admitting: *Deleted

## 2013-12-21 ENCOUNTER — Other Ambulatory Visit: Payer: Self-pay | Admitting: Gastroenterology

## 2013-12-22 ENCOUNTER — Encounter (HOSPITAL_COMMUNITY)
Admission: RE | Disposition: A | Discharge status: Home / Self Care | Payer: Self-pay | Source: Ambulatory Visit | Attending: Gastroenterology

## 2013-12-22 ENCOUNTER — Encounter (HOSPITAL_COMMUNITY): Payer: Self-pay | Admitting: *Deleted

## 2013-12-22 ENCOUNTER — Ambulatory Visit (HOSPITAL_COMMUNITY): Payer: Medicare Other | Admitting: Anesthesiology

## 2013-12-22 ENCOUNTER — Ambulatory Visit (HOSPITAL_COMMUNITY)
Admission: RE | Admit: 2013-12-22 | Discharge: 2013-12-22 | Disposition: A | Discharge status: Home / Self Care | Payer: Medicare Other | Source: Ambulatory Visit | Attending: Gastroenterology | Admitting: Gastroenterology

## 2013-12-22 ENCOUNTER — Encounter (HOSPITAL_COMMUNITY): Payer: Medicare Other | Admitting: Anesthesiology

## 2013-12-22 DIAGNOSIS — E039 Hypothyroidism, unspecified: Secondary | ICD-10-CM | POA: Insufficient documentation

## 2013-12-22 DIAGNOSIS — Z8673 Personal history of transient ischemic attack (TIA), and cerebral infarction without residual deficits: Secondary | ICD-10-CM | POA: Insufficient documentation

## 2013-12-22 DIAGNOSIS — Z90710 Acquired absence of both cervix and uterus: Secondary | ICD-10-CM | POA: Insufficient documentation

## 2013-12-22 DIAGNOSIS — I4510 Unspecified right bundle-branch block: Secondary | ICD-10-CM | POA: Insufficient documentation

## 2013-12-22 DIAGNOSIS — Z79899 Other long term (current) drug therapy: Secondary | ICD-10-CM | POA: Insufficient documentation

## 2013-12-22 DIAGNOSIS — I2510 Atherosclerotic heart disease of native coronary artery without angina pectoris: Secondary | ICD-10-CM | POA: Insufficient documentation

## 2013-12-22 DIAGNOSIS — I080 Rheumatic disorders of both mitral and aortic valves: Secondary | ICD-10-CM | POA: Insufficient documentation

## 2013-12-22 DIAGNOSIS — R195 Other fecal abnormalities: Secondary | ICD-10-CM | POA: Insufficient documentation

## 2013-12-22 DIAGNOSIS — E119 Type 2 diabetes mellitus without complications: Secondary | ICD-10-CM | POA: Insufficient documentation

## 2013-12-22 DIAGNOSIS — I441 Atrioventricular block, second degree: Secondary | ICD-10-CM | POA: Insufficient documentation

## 2013-12-22 DIAGNOSIS — K219 Gastro-esophageal reflux disease without esophagitis: Secondary | ICD-10-CM | POA: Insufficient documentation

## 2013-12-22 DIAGNOSIS — Z800 Family history of malignant neoplasm of digestive organs: Secondary | ICD-10-CM | POA: Insufficient documentation

## 2013-12-22 DIAGNOSIS — D126 Benign neoplasm of colon, unspecified: Secondary | ICD-10-CM | POA: Insufficient documentation

## 2013-12-22 DIAGNOSIS — Z87891 Personal history of nicotine dependence: Secondary | ICD-10-CM | POA: Insufficient documentation

## 2013-12-22 DIAGNOSIS — I10 Essential (primary) hypertension: Secondary | ICD-10-CM | POA: Insufficient documentation

## 2013-12-22 DIAGNOSIS — E669 Obesity, unspecified: Secondary | ICD-10-CM | POA: Insufficient documentation

## 2013-12-22 HISTORY — PX: COLONOSCOPY WITH PROPOFOL: SHX5780

## 2013-12-22 HISTORY — DX: Cerebral infarction, unspecified: I63.9

## 2013-12-22 HISTORY — DX: Anemia, unspecified: D64.9

## 2013-12-22 HISTORY — DX: Gastro-esophageal reflux disease without esophagitis: K21.9

## 2013-12-22 SURGERY — COLONOSCOPY WITH PROPOFOL
Anesthesia: Monitor Anesthesia Care

## 2013-12-22 MED ORDER — LACTATED RINGERS IV SOLN
INTRAVENOUS | Status: DC
  Administered 2013-12-22: 1000 mL via INTRAVENOUS
  Administered 2013-12-22: 14:00:00 via INTRAVENOUS

## 2013-12-22 MED ORDER — SODIUM CHLORIDE 0.9 % IV SOLN: INTRAVENOUS | Status: DC

## 2013-12-22 MED ORDER — PROPOFOL 10 MG/ML IV BOLUS
INTRAVENOUS | Status: DC | PRN
  Administered 2013-12-22: 50 mg via INTRAVENOUS
  Administered 2013-12-22: 25 mg via INTRAVENOUS
  Administered 2013-12-22 (×6): 50 mg via INTRAVENOUS
  Administered 2013-12-22 (×2): 25 mg via INTRAVENOUS
  Administered 2013-12-22 (×4): 50 mg via INTRAVENOUS
  Administered 2013-12-22: 25 mg via INTRAVENOUS

## 2013-12-22 MED ORDER — PROPOFOL 10 MG/ML IV BOLUS
INTRAVENOUS | Status: AC
  Filled 2013-12-22: qty 20

## 2013-12-22 SURGICAL SUPPLY — 21 items

## 2013-12-22 NOTE — Anesthesia Preprocedure Evaluation (Addendum)
Anesthesia Evaluation  Patient identified by MRN, date of birth, ID band Patient awake    Reviewed: Allergy & Precautions, H&P , NPO status , Patient's Chart, lab work & pertinent test results  Airway Mallampati: II TM Distance: >3 FB Neck ROM: Full    Dental no notable dental hx. (+) Upper Dentures, Partial Lower   Pulmonary former smoker,  breath sounds clear to auscultation  Pulmonary exam normal       Cardiovascular hypertension, Pt. on medications - CAD (minimal CAD by cath 8/14) + dysrhythmias (Mobitz type 1) + Valvular Problems/Murmurs (mild AS) AS Rhythm:Regular Rate:Normal     Neuro/Psych TIAnegative psych ROS   GI/Hepatic negative GI ROS, Neg liver ROS,   Endo/Other  diabetes, Type 2, Oral Hypoglycemic Agents  Renal/GU negative Renal ROS  negative genitourinary   Musculoskeletal negative musculoskeletal ROS (+)   Abdominal   Peds negative pediatric ROS (+)  Hematology negative hematology ROS (+)   Anesthesia Other Findings   Reproductive/Obstetrics negative OB ROS                           Anesthesia Physical Anesthesia Plan  ASA: II  Anesthesia Plan: MAC   Post-op Pain Management:    Induction:   Airway Management Planned: Simple Face Mask  Additional Equipment:   Intra-op Plan:   Post-operative Plan:   Informed Consent: I have reviewed the patients History and Physical, chart, labs and discussed the procedure including the risks, benefits and alternatives for the proposed anesthesia with the patient or authorized representative who has indicated his/her understanding and acceptance.   Dental advisory given  Plan Discussed with: CRNA  Anesthesia Plan Comments:         Anesthesia Quick Evaluation

## 2013-12-22 NOTE — Transfer of Care (Signed)
Immediate Anesthesia Transfer of Care Note  Patient: Melissa Becker  Procedure(s) Performed: Procedure(s): COLONOSCOPY WITH PROPOFOL (N/A)  Patient Location: PACU  Anesthesia Type:MAC  Level of Consciousness: awake, sedated and patient cooperative  Airway & Oxygen Therapy: Patient Spontanous Breathing and Patient connected to face mask oxygen  Post-op Assessment: Report given to PACU RN and Post -op Vital signs reviewed and stable  Post vital signs: Reviewed and stable  Complications: No apparent anesthesia complications

## 2013-12-22 NOTE — Anesthesia Postprocedure Evaluation (Signed)
  Anesthesia Post-op Note  Patient: Melissa Becker  Procedure(s) Performed: Procedure(s) (LRB): COLONOSCOPY WITH PROPOFOL (N/A)  Patient Location: PACU  Anesthesia Type: MAC  Level of Consciousness: awake and alert   Airway and Oxygen Therapy: Patient Spontanous Breathing  Post-op Pain: mild  Post-op Assessment: Post-op Vital signs reviewed, Patient's Cardiovascular Status Stable, Respiratory Function Stable, Patent Airway and No signs of Nausea or vomiting  Last Vitals:  Filed Vitals:   12/22/13 1444  BP: 143/56  Pulse: 67  Temp: 36.5 C  Resp: 20    Post-op Vital Signs: stable   Complications: No apparent anesthesia complications

## 2013-12-22 NOTE — Op Note (Signed)
[facility] [place], [address]   COLONOSCOPY PROCEDURE REPORT  PATIENT: Melissa Becker, Melissa Becker  MR#: [id number] BIRTHDATE: 08-14-1946 , 35  yrs. old GENDER: Female ENDOSCOPIST: Laurence Spates, MD REFERRED BY: Dr. Dione Housekeeper PROCEDURE DATE:  12/22/2013 PROCEDURE:   Colonoscopy with Polypectomy ASA CLASS:    class 2 INDICATIONS:  patient has had heme positive stools and has had attempted colonoscopy 4 times unsuccessfully due to what was felt to be due to pelvic adhesions from previous surgeries. 4 different gastroenterologist of tried unsuccessfully. Has had previous history of rectal polyp removed by myself with sigmoidoscopy/unsuccessful colonoscopy. Strong family history of colon cancer and father. Virtual colonoscopy performed with 1 cm polyp in ascending colon and 1/2 cm to 3/4 cm polyps in the descending colon. After long discussions in the office about the possibility of perforation it was decided to try the procedure again with propofol anesthesia and ultrathin colonoscope. MEDICATIONS: MAC. Patient receive propofol 650 mg  DESCRIPTION OF PROCEDURE: A digital exam was performed. The Pentax ultrathin colonoscope was inserted and advanced. The patient did have a loop that was very difficult to pass and was uncomfortable. Eventually were able to sleep the scope and using abdominal pressure was able to pass this area and the sigmoid colon. We then advance to the cecum. Ileocecal valve and appendiceal orifice were seen. Indeed proximal ascending colon and 1 cm sessile polyp was seen. This was biopsies and was then removed with the hot snare. It was too large to go through the scope and we had to cut it into 3 pieces in order to To the scope. The remainder of the ascending in transverse colon were normal with no polyps. In the distal descending colon a 1 cm sessile polyp in 4 mm sessile polyp for encounter. Both were removed with the hot snare. The  larger polyp was cut into pieces and the pieces were sucked to the scope. No other gross lesions were seen. The scope was withdrawn in the colon was decompressed. There were no immediate complications. The pt tolerated well.     COMPLICATIONS: None  ENDOSCOPIC IMPRESSION: 1. Ascending and descending colon polyps removed  RECOMMENDATIONS: 1. routine post polypectomy instructions 2. This patient will need propofol and ultrathin colonoscope for future colonoscopies    _______________________________ eSigned:  Laurence Spates, MD 12/22/2013 2:54 PM CC: Dr. Dione Housekeeper

## 2013-12-22 NOTE — H&P (Signed)
Subjective:   Patient is a 68 y.o. female presents with heme positive stool. She has had several gastroenterologists in the past including myself attempt colonoscopy unsuccessfully due to the mobile: felt to be due to adhesions. We were unable to get pass the sigmoid colon. Her father has had colon cancer. Do these issues a virtual colonoscopy was 10 which on several polyps in ascending colon.and the sigmoid colon was poorly seen. We discussed trying to attempt another colonoscopy under anesthesia with the ultrathin scope to pass this area and hopefully remove these polyps. It was discussed in the office with the patient that she is risk of perforation. She understands is desired to go ahead.   Patient Active Problem List   Diagnosis Date Noted  . RBBB (right bundle branch block) 08/24/2013  . Ejection fraction   . Aortic stenosis   . Mitral regurgitation   . Latex allergy   . TIA (transient ischemic attack)   . CAD (coronary artery disease)   . Anemia due to acute blood loss 04/18/2013  . GI bleed 04/18/2013  . Mobitz type 1 second degree AV block 04/18/2013  . HTN (hypertension) 04/18/2013  . Diabetes mellitus type 2 in obese 04/18/2013   Past Medical History  Diagnosis Date  . Hypothyroid   . Type 2 diabetes mellitus   . Hypertension   . TIA (transient ischemic attack)     history of TIAs  . Neuropathy   . Ejection fraction   . Aortic stenosis   . Mitral regurgitation   . Latex allergy     Itching  . GI bleed   . CAD (coronary artery disease)   . GERD (gastroesophageal reflux disease)   . Anemia   . Stroke     TIA    Past Surgical History  Procedure Laterality Date  . Tonsillectomy    . Abdominal hysterectomy    . Cholecystectomy    . Ventral hernia repair    . Esophagogastroduodenoscopy N/A 04/15/2013    Procedure: ESOPHAGOGASTRODUODENOSCOPY (EGD);  Surgeon: Wonda Horner, MD;  Location: [facility] ENDOSCOPY;  Service: Endoscopy;  Laterality: N/A;  . Givens capsule study  N/A 04/18/2013    Procedure: GIVENS CAPSULE STUDY;  Surgeon: Wonda Horner, MD;  Location: [facility] ENDOSCOPY;  Service: Endoscopy;  Laterality: N/A;  . Tonsillectomy    . Hernia repair    . Right finger surgery  1982    amputation of right ring finger  . Right toe surgery      right toe surgery-next to big toe amputated    Prescriptions prior to admission  Medication Sig Dispense Refill  . amLODipine (NORVASC) 10 MG tablet Take 10 mg by mouth every morning.      Marland Kitchen atorvastatin (LIPITOR) 80 MG tablet Take 80 mg by mouth every evening.      . benazepril (LOTENSIN) 40 MG tablet Take 40 mg by mouth every morning.      . DULoxetine (CYMBALTA) 60 MG capsule Take 60 mg by mouth every evening.      . ferrous fumarate (HEMOCYTE - 106 MG FE) 325 (106 FE) MG TABS tablet Take 1 tablet by mouth 3 (three) times daily.      . fluticasone (FLONASE) 50 MCG/ACT nasal spray Place 2 sprays into both nostrils as needed for allergies. prn      . furosemide (LASIX) 20 MG tablet Take 20 mg by mouth daily as needed for fluid.       Marland Kitchen glimepiride (AMARYL) 4 MG  tablet Take 4 mg by mouth daily.       Marland Kitchen HYDROcodone-acetaminophen (NORCO/VICODIN) 5-325 MG per tablet Take 1 tablet by mouth every 6 (six) hours as needed for pain. For pain      . levothyroxine (SYNTHROID, LEVOTHROID) 100 MCG tablet Take 100 mcg by mouth daily before breakfast.      . Linagliptin-Metformin HCl (JENTADUETO) 2.01-999 MG TABS Take 1 tablet by mouth 2 (two) times daily.      . pantoprazole (PROTONIX) 40 MG tablet Take 1 tablet (40 mg total) by mouth 2 (two) times daily.  60 tablet  3   Allergies  Allergen Reactions  . Latex Itching    History  Substance Use Topics  . Smoking status: Former Smoker    Types: Cigarettes    Quit date: 10/27/1988  . Smokeless tobacco: Not on file  . Alcohol Use: No    Family History  Problem Relation Age of Onset  . Colon cancer Father      Objective:   Patient Vitals for the past 8 hrs:  BP Temp Temp src  Resp SpO2 Height Weight  12/22/13 1229 134/59 mmHg 98.4 F (36.9 C) Oral 20 95 % 5' 9.5" (1.765 m) 111.585 kg (246 lb)         See MD Preop evaluation    Assessment:  1. Heme positive stool 2. Family history of colon cancer 3. Possible colon polyps in the right colon on VC.   Plan:    Colonoscopy at this time propofol anathesia. Risk of perforation has been discussed extensively with the patient.

## 2013-12-22 NOTE — Addendum Note (Signed)
Addended by: Ildefonso Keaney JR. on: 12/22/2013 08:07 AM   Modules accepted: Orders  

## 2013-12-22 NOTE — Discharge Instructions (Signed)
Colonoscopy, Care After Refer to this sheet in the next few weeks. These instructions provide you with information on caring for yourself after your procedure. Your health care provider may also give you more specific instructions. Your treatment has been planned according to current medical practices, but problems sometimes occur. Call your health care provider if you have any problems or questions after your procedure. WHAT TO EXPECT AFTER THE PROCEDURE  After your procedure, it is typical to have the following:  A small amount of blood in your stool.  Moderate amounts of gas and mild abdominal cramping or bloating. HOME CARE INSTRUCTIONS  Do not drive, operate machinery, or sign important documents for 24 hours.  You may shower and resume your regular physical activities, but move at a slower pace for the first 24 hours.  Take frequent rest periods for the first 24 hours.  Walk around or put a warm pack on your abdomen to help reduce abdominal cramping and bloating.  Drink enough fluids to keep your urine clear or pale yellow.  You may resume your normal diet as instructed by your health care provider. Avoid heavy or fried foods that are hard to digest.  Avoid drinking alcohol for 24 hours or as instructed by your health care provider.  Only take over-the-counter or prescription medicines as directed by your health care provider.  If a tissue sample (biopsy) was taken during your procedure:  Do not take aspirin or blood thinners for 7 days, or as instructed by your health care provider.  Do not drink alcohol for 7 days, or as instructed by your health care provider.  Eat soft foods for the first 24 hours. SEEK MEDICAL CARE IF: You have persistent spotting of blood in your stool 2 3 days after the procedure. SEEK IMMEDIATE MEDICAL CARE IF:  You have more than a small spotting of blood in your stool.  You pass large blood clots in your stool.  Your abdomen is swollen  (distended).  You have nausea or vomiting.  You have a fever.  You have increasing abdominal pain that is not relieved with medicine. Document Released: 04/07/2004 Document Revised: 06/14/2013 Document Reviewed: 05/01/2013 [facility] Patient Information 2014 [place]. No aspirin, ibuprofen or other NSAID medications for 5 days. Office will send note or call when pathology results are obtained. Colonoscopy will be repeated based on the pathology results.

## 2013-12-25 ENCOUNTER — Encounter (HOSPITAL_COMMUNITY): Payer: Self-pay | Admitting: Gastroenterology

## 2014-04-02 ENCOUNTER — Other Ambulatory Visit: Payer: Self-pay | Admitting: *Deleted

## 2014-04-30 ENCOUNTER — Encounter: Payer: Self-pay | Admitting: Vascular Surgery

## 2014-05-01 ENCOUNTER — Other Ambulatory Visit (HOSPITAL_COMMUNITY): Payer: Medicare Other

## 2014-05-01 ENCOUNTER — Encounter: Payer: Self-pay | Admitting: Vascular Surgery

## 2014-05-01 ENCOUNTER — Ambulatory Visit (INDEPENDENT_AMBULATORY_CARE_PROVIDER_SITE_OTHER): Payer: Medicare Other | Admitting: Vascular Surgery

## 2014-05-01 VITALS — BP 147/60 | HR 79 | Ht 69.5 in | Wt 249.2 lb

## 2014-05-01 DIAGNOSIS — M79609 Pain in unspecified limb: Secondary | ICD-10-CM

## 2014-05-01 DIAGNOSIS — M79606 Pain in leg, unspecified: Secondary | ICD-10-CM

## 2014-05-01 NOTE — Progress Notes (Signed)
Patient name: Melissa Becker MRN: [id number] DOB: May 01, 1946 Sex: female   Referred by: Edrick Oh  Reason for referral:  Chief Complaint  Patient presents with  . New Evaluation    c/o bilateral LE pain     HISTORY OF PRESENT ILLNESS: Patient presents today for discussion of recent noninvasive studies. She has ulceration over her left lateral foot is being taken care of by Dr. Blanch Media. She underwent noninvasive studies that he had these for review from an outlying hospital. She is here for further discussion of this. He does have a long history of diabetes and peripheral neuropathy. She's had multiple prior episodes of her foot ulceration which have healed with conservative treatment over time. She does not have any rest pain and does not have any claudication type symptoms. She is known to me from prior treatment for venous hypertension. She had had episodes of bleeding and underwent ablation of the saphenous vein. She reports is been years since she had any bleeding. Her treatment was in 2009.  Past Medical History  Diagnosis Date  . Hypothyroid   . Type 2 diabetes mellitus   . Hypertension   . TIA (transient ischemic attack)     history of TIAs  . Neuropathy   . Ejection fraction   . Aortic stenosis   . Mitral regurgitation   . Latex allergy     Itching  . GI bleed   . CAD (coronary artery disease)   . GERD (gastroesophageal reflux disease)   . Anemia   . Stroke     TIA  . Allergy   . Atrial fibrillation     Past Surgical History  Procedure Laterality Date  . Tonsillectomy    . Abdominal hysterectomy    . Cholecystectomy    . Ventral hernia repair    . Esophagogastroduodenoscopy N/A 04/15/2013    Procedure: ESOPHAGOGASTRODUODENOSCOPY (EGD);  Surgeon: Wonda Horner, MD;  Location: [facility] ENDOSCOPY;  Service: Endoscopy;  Laterality: N/A;  . Givens capsule study N/A 04/18/2013    Procedure: GIVENS CAPSULE STUDY;  Surgeon: Wonda Horner, MD;  Location: [facility] ENDOSCOPY;  Service:  Endoscopy;  Laterality: N/A;  . Tonsillectomy    . Hernia repair    . Right finger surgery  1982    amputation of right ring finger  . Right toe surgery      right toe surgery-next to big toe amputated  . Colonoscopy with propofol N/A 12/22/2013    Procedure: COLONOSCOPY WITH PROPOFOL;  Surgeon: Winfield Cunas., MD;  Location: [facility] ENDOSCOPY;  Service: Endoscopy;  Laterality: N/A;    History   Social History  . Marital Status: Married    Spouse Name: N/A    Number of Children: N/A  . Years of Education: N/A   Occupational History  . Not on file.   Social History Main Topics  . Smoking status: Former Smoker    Types: Cigarettes    Quit date: 10/27/1988  . Smokeless tobacco: Not on file  . Alcohol Use: No  . Drug Use: No  . Sexual Activity: Not on file   Other Topics Concern  . Not on file   Social History Narrative  . No narrative on file    Family History  Problem Relation Age of Onset  . Colon cancer Father   . Cancer Father   . Deep vein thrombosis Father   . Diabetes Father   . Heart disease Father   . Cancer Mother   .  Heart disease Son   . Varicose Veins Son     Allergies as of 05/01/2014 - Review Complete 05/01/2014  Allergen Reaction Noted  . Latex Itching 04/14/2013    Current Outpatient Prescriptions on File Prior to Visit  Medication Sig Dispense Refill  . amLODipine (NORVASC) 10 MG tablet Take 10 mg by mouth every morning.      Marland Kitchen atorvastatin (LIPITOR) 80 MG tablet Take 80 mg by mouth every evening.      . benazepril (LOTENSIN) 40 MG tablet Take 40 mg by mouth every morning.      . DULoxetine (CYMBALTA) 60 MG capsule Take 60 mg by mouth every evening.      . ferrous fumarate (HEMOCYTE - 106 MG FE) 325 (106 FE) MG TABS tablet Take 1 tablet by mouth 3 (three) times daily.      . fluticasone (FLONASE) 50 MCG/ACT nasal spray Place 2 sprays into both nostrils as needed for allergies. prn      . furosemide (LASIX) 20 MG tablet Take 20 mg by mouth  daily as needed for fluid.       Marland Kitchen glimepiride (AMARYL) 4 MG tablet Take 4 mg by mouth 2 (two) times daily.       Marland Kitchen HYDROcodone-acetaminophen (NORCO/VICODIN) 5-325 MG per tablet Take 1 tablet by mouth every 6 (six) hours as needed for pain. For pain      . levothyroxine (SYNTHROID, LEVOTHROID) 100 MCG tablet Take 100 mcg by mouth daily before breakfast.      . Linagliptin-Metformin HCl (JENTADUETO) 2.01-999 MG TABS Take 1 tablet by mouth 2 (two) times daily.      . pantoprazole (PROTONIX) 40 MG tablet Take 1 tablet (40 mg total) by mouth 2 (two) times daily.  60 tablet  3   No current facility-administered medications on file prior to visit.     REVIEW OF SYSTEMS:  Positives indicated with an "X"  CARDIOVASCULAR:  [ ]  chest pain   [ ]  chest pressure   [ ]  palpitations   [ ]  orthopnea   [x ] dyspnea on exertion   [ ]  claudication   [ ]  rest pain   [ ]  DVT   [ ]  phlebitis PULMONARY:   [ ]  productive cough   [ ]  asthma   [ ]  wheezing NEUROLOGIC:   [ ]  weakness  [ ]  paresthesias  [ ]  aphasia  [ ]  amaurosis  [ ]  dizziness HEMATOLOGIC:   [ ]  bleeding problems   [ ]  clotting disorders MUSCULOSKELETAL:  [ ]  joint pain   [ ]  joint swelling GASTROINTESTINAL: [ ]   blood in stool  [ ]   hematemesis GENITOURINARY:  [ ]   dysuria  [ ]   hematuria PSYCHIATRIC:  [ ]  history of major depression INTEGUMENTARY:  [ ]  rashes  [ x] ulcers CONSTITUTIONAL:  [ ]  fever   [ ]  chills  PHYSICAL EXAMINATION:  General: The patient is a well-nourished female, in no acute distress. Vital signs are BP 147/60  Pulse 79  Ht 5' 9.5" (1.765 m)  Wt 249 lb 3.2 oz (113.036 kg)  BMI 36.29 kg/m2  SpO2 98% Pulmonary: There is a good air exchange   Musculoskeletal: There are no major deformities.  There is no significant extremity pain. Neurologic: No focal weakness or paresthesias are detected, Skin: Multiple varicosities bilaterally. Telangiectasia or extensive as well. She does have some superficial ulcerations over  lateral left foot and also very superficial ulceration over her left second toe. Psychiatric: The  patient has normal affect. Cardiovascular: Palpable posterior tibial pulses bilaterally   Outlying noninvasive studies reveal ankle arm index of 0.97 on the left and 0.90 on the right with triphasic waveforms  Impression and Plan:  No evidence of significant arterial insufficiency. Ultrasound had suggested possible CT angiogram. I certainly would not recommend this as the patient does not have any evidence of arterial insufficiency. She was reassured that this will see [place] again on an as-needed basis. She will continue to followup with Dr. Blanch Media for foot care    Aman[facility] [place] Office: [phone]

## 2014-08-16 ENCOUNTER — Encounter (HOSPITAL_COMMUNITY): Payer: Self-pay | Admitting: Cardiology

## 2014-08-20 ENCOUNTER — Encounter: Payer: Self-pay | Admitting: Cardiology

## 2014-08-20 ENCOUNTER — Ambulatory Visit (INDEPENDENT_AMBULATORY_CARE_PROVIDER_SITE_OTHER): Payer: Medicare Other | Admitting: Cardiology

## 2014-08-20 VITALS — BP 146/65 | HR 85 | Ht 70.0 in | Wt 253.0 lb

## 2014-08-20 DIAGNOSIS — R5383 Other fatigue: Secondary | ICD-10-CM | POA: Insufficient documentation

## 2014-08-20 DIAGNOSIS — I4510 Unspecified right bundle-branch block: Secondary | ICD-10-CM

## 2014-08-20 DIAGNOSIS — D509 Iron deficiency anemia, unspecified: Secondary | ICD-10-CM

## 2014-08-20 DIAGNOSIS — R0602 Shortness of breath: Secondary | ICD-10-CM

## 2014-08-20 DIAGNOSIS — I350 Nonrheumatic aortic (valve) stenosis: Secondary | ICD-10-CM

## 2014-08-20 DIAGNOSIS — D62 Acute posthemorrhagic anemia: Secondary | ICD-10-CM

## 2014-08-20 DIAGNOSIS — I441 Atrioventricular block, second degree: Secondary | ICD-10-CM

## 2014-08-20 DIAGNOSIS — D649 Anemia, unspecified: Secondary | ICD-10-CM | POA: Insufficient documentation

## 2014-08-20 DIAGNOSIS — I739 Peripheral vascular disease, unspecified: Secondary | ICD-10-CM | POA: Insufficient documentation

## 2014-08-20 MED ORDER — FUROSEMIDE 20 MG PO TABS: 20.0000 mg | ORAL_TABLET | Freq: Every day | ORAL | Status: DC

## 2014-08-20 NOTE — Assessment & Plan Note (Signed)
Earlier last year the patient had severe anemia. It is my understanding that this had improved. Most recently her hemoglobin was back down to 9.4   3 weeks ago. It will be rechecked today. If she has worsening anemia I will encourage aggressive workup of this problem. If not, when I see her back we will further assess other possibility of etiologies for her fatigue and shortness of breath.  As part of today's evaluation I spent greater than 25 minutes with her total care. More than half of this time has been with direct contact with the patient. We have discussed at length all of her symptoms and all the history surrounding her anemia workup and other issues at this time.

## 2014-08-20 NOTE — Progress Notes (Signed)
Patient ID: Melissa Becker, female   DOB: Dec 15, 1945, 68 y.o.   MRN: [id number]    HPI Patient is seen back today to follow-up mild coronary disease and a history of intermittent Mobitz type I block. In the past I had seen her in the hospital with profound anemia. It was at that time that we had seen some Mobitz type I block. I saw her last in the office December, 2014. Her anemia workup was ongoing. Her rhythm was stable. There was a history of mild aortic stenosis. There was a history of mild coronary disease. Her overall cardiac status was stable at that time.  She says that her hemoglobin had been up above 11. Approximately 3 weeks ago was down to 9.4. Her primary care team was concerned that she might be having bradycardia. She is now here for follow-up. She says that she's had weight gain. However by our scale her weight is the same as the last visit. She has significant exertional shortness of breath. There is no chest pain.  Allergies  Allergen Reactions  . Sulfamethoxazole-Trimethoprim Other (See Comments)    Headaches  . Latex Itching    Current Outpatient Prescriptions  Medication Sig Dispense Refill  . amLODipine (NORVASC) 10 MG tablet Take 10 mg by mouth every morning.    Marland Kitchen atorvastatin (LIPITOR) 80 MG tablet Take 80 mg by mouth every evening.    . benazepril (LOTENSIN) 40 MG tablet Take 40 mg by mouth every morning.    . cephALEXin (KEFLEX) 500 MG capsule Take 1 capsule by mouth 2 (two) times daily.    . DULoxetine (CYMBALTA) 60 MG capsule Take 60 mg by mouth every evening.    . ergocalciferol (VITAMIN D2) 50000 UNITS capsule Take 50,000 Units by mouth once a week.    . fluticasone (FLONASE) 50 MCG/ACT nasal spray Place 2 sprays into both nostrils as needed for allergies. prn    . furosemide (LASIX) 20 MG tablet Take 1 tablet (20 mg total) by mouth daily. 30 tablet 3  . glimepiride (AMARYL) 4 MG tablet Take 4 mg by mouth 2 (two) times daily.     Marland Kitchen HYDROcodone-acetaminophen  (NORCO/VICODIN) 5-325 MG per tablet Take 1 tablet by mouth every 6 (six) hours as needed for pain. For pain    . levothyroxine (SYNTHROID, LEVOTHROID) 100 MCG tablet Take 100 mcg by mouth daily before breakfast.    . Linagliptin-Metformin HCl (JENTADUETO) 2.01-999 MG TABS Take 1 tablet by mouth 2 (two) times daily.    . metFORMIN (GLUMETZA) 1000 MG (MOD) 24 hr tablet Take 1,000 mg by mouth daily.    . pantoprazole (PROTONIX) 40 MG tablet Take 1 tablet (40 mg total) by mouth 2 (two) times daily. 60 tablet 3   No current facility-administered medications for this visit.    History   Social History  . Marital Status: Married    Spouse Name: N/A    Number of Children: N/A  . Years of Education: N/A   Occupational History  . Not on file.   Social History Main Topics  . Smoking status: Former Smoker    Types: Cigarettes    Start date: 07/12/1963    Quit date: 10/27/1988  . Smokeless tobacco: Never Used  . Alcohol Use: No  . Drug Use: No  . Sexual Activity: Not on file   Other Topics Concern  . Not on file   Social History Narrative    Family History  Problem Relation Age of Onset  .  Colon cancer Father   . Cancer Father   . Deep vein thrombosis Father   . Diabetes Father   . Heart disease Father   . Cancer Mother   . Heart disease Son   . Varicose Veins Son     Past Medical History  Diagnosis Date  . Hypothyroid   . Type 2 diabetes mellitus   . Hypertension   . TIA (transient ischemic attack)     history of TIAs  . Neuropathy   . Ejection fraction   . Aortic stenosis   . Mitral regurgitation   . Latex allergy     Itching  . GI bleed   . CAD (coronary artery disease)   . GERD (gastroesophageal reflux disease)   . Anemia   . Stroke     TIA  . Allergy   . Atrial fibrillation     Past Surgical History  Procedure Laterality Date  . Tonsillectomy    . Abdominal hysterectomy    . Cholecystectomy    . Ventral hernia repair    . Esophagogastroduodenoscopy  N/A 04/15/2013    Procedure: ESOPHAGOGASTRODUODENOSCOPY (EGD);  Surgeon: Wonda Horner, MD;  Location: [facility] ENDOSCOPY;  Service: Endoscopy;  Laterality: N/A;  . Givens capsule study N/A 04/18/2013    Procedure: GIVENS CAPSULE STUDY;  Surgeon: Wonda Horner, MD;  Location: [facility] ENDOSCOPY;  Service: Endoscopy;  Laterality: N/A;  . Tonsillectomy    . Hernia repair    . Right finger surgery  1982    amputation of right ring finger  . Right toe surgery      right toe surgery-next to big toe amputated  . Colonoscopy with propofol N/A 12/22/2013    Procedure: COLONOSCOPY WITH PROPOFOL;  Surgeon: Winfield Cunas., MD;  Location: [facility] ENDOSCOPY;  Service: Endoscopy;  Laterality: N/A;  . Left heart catheterization with coronary angiogram N/A 04/17/2013    Procedure: LEFT HEART CATHETERIZATION WITH CORONARY ANGIOGRAM;  Surgeon: Peter M Martinique, MD;  Location: [facility] CATH LAB;  Service: Cardiovascular;  Laterality: N/A;    Patient Active Problem List   Diagnosis Date Noted  . Pain in limb 05/01/2014  . RBBB (right bundle branch block) 08/24/2013  . Ejection fraction   . Aortic stenosis   . Mitral regurgitation   . Latex allergy   . TIA (transient ischemic attack)   . CAD (coronary artery disease)   . Anemia due to acute blood loss 04/18/2013  . GI bleed 04/18/2013  . Mobitz type 1 second degree AV block 04/18/2013  . HTN (hypertension) 04/18/2013  . Diabetes mellitus type 2 in obese 04/18/2013    ROS  Patient denies fever, chills, headache, sweats, rash, change in vision, change in hearing, chest pain, cough, nausea or vomiting, urinary symptoms. All other systems are reviewed and are negative.  PHYSICAL EXAM Patient is overweight but stable. Head is atraumatic. Sclerae and conjunctiva are normal. There is no jugular venous distention. Lungs reveal a few basilar rales. There is no respiratory distress. Cardiac exam reveals an S1 and S2. There is a 2/6 systolic murmur. The abdomen is soft. She has  significant venous insufficiency with varicose veins in her legs. There is trace edema. There are no musculoskeletal deformities. She is wearing a walking boot to help with the healing of an ulcer on her right foot. There are no skin rashes.  Filed Vitals:   08/20/14 1105  BP: 146/65  Pulse: 85  Height: 5\' 10"  (1.778 m)  Weight: 253 lb (  114.76 kg)  SpO2: 96%   EKG is done today and reviewed by me. There is sinus rhythm. There is old right bundle branch block.  ASSESSMENT & PLAN

## 2014-08-20 NOTE — Assessment & Plan Note (Signed)
I'm concerned that her shortness of breath and fatigue may be related to worsening anemia. Labs will be checked again today.

## 2014-08-20 NOTE — Patient Instructions (Addendum)
Your physician recommends that you schedule a follow-up appointment in: 3 weeks. Your physician has recommended you make the following change in your medication:  Change furosemide 20 mg to daily instead of as needed. Continue all other medications the same. Your physician recommends that you have lab work today to check your BMET and CBC.

## 2014-08-20 NOTE — Assessment & Plan Note (Signed)
Her murmur represents mild aortic stenosis. No further workup at this time.

## 2014-08-20 NOTE — Assessment & Plan Note (Signed)
The patient's Doppler from July, 2015 does show mild occlusive disease of the right lower extremity.

## 2014-08-20 NOTE — Assessment & Plan Note (Signed)
Previously we had seen Mobitz type I AV block. On her EKG today she has sinus rhythm. I'm not convinced that her current symptoms are related to a bradycardia arrhythmia. However when I see her back soon, I will reassess and decide if monitoring is appropriate.

## 2014-08-20 NOTE — Assessment & Plan Note (Signed)
I'm concerned that her fatigue may be related to worsening anemia. Labs will be checked today.

## 2014-08-24 ENCOUNTER — Telehealth: Payer: Self-pay | Admitting: *Deleted

## 2014-08-24 NOTE — Telephone Encounter (Signed)
Patient informed and verbalized understanding of plan. Copy sent to PCP 

## 2014-08-24 NOTE — Telephone Encounter (Signed)
-----   Message from Carlena Bjornstad, MD sent at 08/24/2014 12:54 PM EST ----- Please notify the patient that I have reviewed her labs. Her chemistries are normal. Her hemoglobin is down to 8.6. Please tell her that I think this may be playing a role with her symptoms. Please ask her to follow-up soon with her primary physician concerning this. Please find out who her primary physician is in send the labs to that doctor.

## 2014-09-12 ENCOUNTER — Ambulatory Visit (INDEPENDENT_AMBULATORY_CARE_PROVIDER_SITE_OTHER): Payer: Medicare Other | Admitting: Cardiology

## 2014-09-12 ENCOUNTER — Encounter: Payer: Self-pay | Admitting: Cardiology

## 2014-09-12 VITALS — BP 140/58 | HR 62 | Ht 69.0 in | Wt 247.0 lb

## 2014-09-12 DIAGNOSIS — I350 Nonrheumatic aortic (valve) stenosis: Secondary | ICD-10-CM

## 2014-09-12 DIAGNOSIS — I2510 Atherosclerotic heart disease of native coronary artery without angina pectoris: Secondary | ICD-10-CM

## 2014-09-12 DIAGNOSIS — R0602 Shortness of breath: Secondary | ICD-10-CM

## 2014-09-12 DIAGNOSIS — R609 Edema, unspecified: Secondary | ICD-10-CM | POA: Diagnosis not present

## 2014-09-12 NOTE — Assessment & Plan Note (Signed)
Coronary disease is stable. No change in therapy. 

## 2014-09-12 NOTE — Assessment & Plan Note (Signed)
The patient continues to have some exertional shortness of breath. It is somewhat improved. Her anemia plays a significant role. No further cardiac workup at this point. I will see her back in 3 months.

## 2014-09-12 NOTE — Assessment & Plan Note (Signed)
She has mild aortic stenosis. She does not need a follow-up echo at this time.

## 2014-09-12 NOTE — Assessment & Plan Note (Signed)
She had mild edema I saw her last. This has responded to a small dose of furosemide.

## 2014-09-12 NOTE — Progress Notes (Signed)
Patient ID: Melissa Becker, female   DOB: April 02, 1946, 69 y.o.   MRN: [id number]    HPI Patient is seen today to follow-up mild coronary disease and some mild AV block in the past. She has significant anemia. This continues to be a problem. This is being managed carefully by her primary team. When I saw her August 20, 2014 her hemoglobin was 8.6. She tells me recently was 8.4. I decided to have her take her diuretic daily at 20 mg of Lasix as opposed to when necessary. She has diuresed and has some improvement in her shortness of breath.  Allergies  Allergen Reactions  . Sulfamethoxazole-Trimethoprim Other (See Comments)    Headaches  . Latex Itching    Current Outpatient Prescriptions  Medication Sig Dispense Refill  . amLODipine (NORVASC) 10 MG tablet Take 10 mg by mouth every morning.    Marland Kitchen atorvastatin (LIPITOR) 80 MG tablet Take 80 mg by mouth every evening.    . benazepril (LOTENSIN) 40 MG tablet Take 40 mg by mouth every morning.    . cephALEXin (KEFLEX) 500 MG capsule Take 1 capsule by mouth 2 (two) times daily.    . DULoxetine (CYMBALTA) 60 MG capsule Take 60 mg by mouth every evening.    . ergocalciferol (VITAMIN D2) 50000 UNITS capsule Take 50,000 Units by mouth once a week.    . Ferrous Sulfate (IRON) 325 (65 FE) MG TABS Take 1 tablet by mouth.    . fluticasone (FLONASE) 50 MCG/ACT nasal spray Place 2 sprays into both nostrils as needed for allergies. prn    . furosemide (LASIX) 20 MG tablet Take 1 tablet (20 mg total) by mouth daily. 30 tablet 3  . glimepiride (AMARYL) 4 MG tablet Take 4 mg by mouth 2 (two) times daily.     Marland Kitchen HYDROcodone-acetaminophen (NORCO/VICODIN) 5-325 MG per tablet Take 1 tablet by mouth every 6 (six) hours as needed for pain. For pain    . levothyroxine (SYNTHROID, LEVOTHROID) 100 MCG tablet Take 100 mcg by mouth daily before breakfast.    . Linagliptin-Metformin HCl (JENTADUETO) 2.01-999 MG TABS Take 1 tablet by mouth 2 (two) times daily.    . metFORMIN  (GLUMETZA) 1000 MG (MOD) 24 hr tablet Take 1,000 mg by mouth daily.    . pantoprazole (PROTONIX) 40 MG tablet Take 1 tablet (40 mg total) by mouth 2 (two) times daily. 60 tablet 3   No current facility-administered medications for this visit.    History   Social History  . Marital Status: Married    Spouse Name: N/A    Number of Children: N/A  . Years of Education: N/A   Occupational History  . Not on file.   Social History Main Topics  . Smoking status: Former Smoker    Types: Cigarettes    Start date: 07/12/1963    Quit date: 10/27/1988  . Smokeless tobacco: Never Used  . Alcohol Use: No  . Drug Use: No  . Sexual Activity: Not on file   Other Topics Concern  . Not on file   Social History Narrative    Family History  Problem Relation Age of Onset  . Colon cancer Father   . Cancer Father   . Deep vein thrombosis Father   . Diabetes Father   . Heart disease Father   . Cancer Mother   . Heart disease Son   . Varicose Veins Son     Past Medical History  Diagnosis Date  . Hypothyroid   .  Type 2 diabetes mellitus   . Hypertension   . TIA (transient ischemic attack)     history of TIAs  . Neuropathy   . Ejection fraction   . Aortic stenosis   . Mitral regurgitation   . Latex allergy     Itching  . GI bleed   . CAD (coronary artery disease)   . GERD (gastroesophageal reflux disease)   . Anemia   . Stroke     TIA  . Allergy   . Atrial fibrillation     Past Surgical History  Procedure Laterality Date  . Tonsillectomy    . Abdominal hysterectomy    . Cholecystectomy    . Ventral hernia repair    . Esophagogastroduodenoscopy N/A 04/15/2013    Procedure: ESOPHAGOGASTRODUODENOSCOPY (EGD);  Surgeon: Wonda Horner, MD;  Location: [facility] ENDOSCOPY;  Service: Endoscopy;  Laterality: N/A;  . Givens capsule study N/A 04/18/2013    Procedure: GIVENS CAPSULE STUDY;  Surgeon: Wonda Horner, MD;  Location: [facility] ENDOSCOPY;  Service: Endoscopy;  Laterality: N/A;  .  Tonsillectomy    . Hernia repair    . Right finger surgery  1982    amputation of right ring finger  . Right toe surgery      right toe surgery-next to big toe amputated  . Colonoscopy with propofol N/A 12/22/2013    Procedure: COLONOSCOPY WITH PROPOFOL;  Surgeon: Winfield Cunas., MD;  Location: [facility] ENDOSCOPY;  Service: Endoscopy;  Laterality: N/A;  . Left heart catheterization with coronary angiogram N/A 04/17/2013    Procedure: LEFT HEART CATHETERIZATION WITH CORONARY ANGIOGRAM;  Surgeon: Peter M Martinique, MD;  Location[facility] CATH LAB;  Service: Cardiovascular;  Laterality: N/A;    Patient Active Problem List   Diagnosis Date Noted  . Exertional shortness of breath 08/20/2014  . Fatigue 08/20/2014  . Anemia 08/20/2014  . PAD (peripheral artery disease) 08/20/2014  . Pain in limb 05/01/2014  . RBBB (right bundle branch block) 08/24/2013  . Ejection fraction   . Aortic stenosis   . Mitral regurgitation   . Latex allergy   . TIA (transient ischemic attack)   . CAD (coronary artery disease)   . Anemia due to acute blood loss 04/18/2013  . GI bleed 04/18/2013  . Mobitz type 1 second degree AV block 04/18/2013  . HTN (hypertension) 04/18/2013  . Diabetes mellitus type 2 in obese 04/18/2013    ROS  Patient denies fever, chills, headache, sweats, rash, change in vision, change in hearing, chest pain, cough, nausea or vomiting, urinary symptoms. All other systems are reviewed and are negative.  PHYSICAL EXAM Patient is oriented to person time and place. Affect is normal. Head is atraumatic. Sclera and conjunctiva are normal. She is overweight. There is no jugular venous distention. Lungs are clear. Respiratory effort is unlabored. Cardiac exam reveals S1 and S2. There is a systolic murmur consistent with her mild aortic stenosis. It radiates to her neck. Abdomen is soft. There is no peripheral edema. She had been wearing a walking boot when I saw her last. This is no longer  present.  Filed Vitals:   09/12/14 1014  BP: 140/58  Pulse: 62  Height: 5\' 9"  (1.753 m)  Weight: 247 lb (112.038 kg)     ASSESSMENT & PLAN

## 2014-09-12 NOTE — Assessment & Plan Note (Signed)
Her anemia is significant. It is being managed by her primary team. I suspect that her symptoms are related to her low hemoglobin.

## 2014-09-12 NOTE — Patient Instructions (Signed)
Your physician recommends that you schedule a follow-up appointment in: 3 months. Your physician recommends that you continue on your current medications as directed. Please refer to the Current Medication list given to you today. 

## 2014-10-09 ENCOUNTER — Encounter (HOSPITAL_COMMUNITY): Payer: Self-pay | Admitting: *Deleted

## 2014-10-17 NOTE — Anesthesia Preprocedure Evaluation (Addendum)
Anesthesia Evaluation  Patient identified by MRN, date of birth, ID band Patient awake    Reviewed: Allergy & Precautions, NPO status , Patient's Chart, lab work & pertinent test results  History of Anesthesia Complications Negative for: history of anesthetic complications  Airway Mallampati: III  TM Distance: >3 FB Neck ROM: Full    Dental no notable dental hx. (+) Dental Advisory Given, Edentulous Upper, Partial Lower   Pulmonary shortness of breath and with exertion, former smoker,  breath sounds clear to auscultation  Pulmonary exam normal       Cardiovascular hypertension, Pt. on medications + CAD and + Peripheral Vascular Disease + dysrhythmias (mobitz type 1) + Valvular Problems/Murmurs (mild AS and mild MR) AS and MR Rhythm:Regular Rate:Normal     Neuro/Psych TIACVA negative psych ROS   GI/Hepatic Neg liver ROS, GERD-  Medicated and Controlled,  Endo/Other  diabetes, Type 2, Oral Hypoglycemic AgentsHypothyroidism   Renal/GU negative Renal ROS  negative genitourinary   Musculoskeletal negative musculoskeletal ROS (+)   Abdominal   Peds negative pediatric ROS (+)  Hematology  (+) anemia ,   Anesthesia Other Findings   Reproductive/Obstetrics negative OB ROS                            Anesthesia Physical Anesthesia Plan  ASA: III  Anesthesia Plan: MAC   Post-op Pain Management:    Induction: Intravenous  Airway Management Planned: Natural Airway and Nasal Cannula  Additional Equipment:   Intra-op Plan:   Post-operative Plan:   Informed Consent: I have reviewed the patients History and Physical, chart, labs and discussed the procedure including the risks, benefits and alternatives for the proposed anesthesia with the patient or authorized representative who has indicated his/her understanding and acceptance.   Dental advisory given  Plan Discussed with:  CRNA  Anesthesia Plan Comments:         Anesthesia Quick Evaluation

## 2014-10-18 ENCOUNTER — Other Ambulatory Visit: Payer: Self-pay | Admitting: Gastroenterology

## 2014-10-18 ENCOUNTER — Encounter (HOSPITAL_COMMUNITY): Payer: Self-pay | Admitting: Certified Registered"

## 2014-10-18 ENCOUNTER — Encounter (HOSPITAL_COMMUNITY)
Admission: RE | Disposition: A | Discharge status: Home / Self Care | Payer: PRIVATE HEALTH INSURANCE | Source: Ambulatory Visit | Attending: Gastroenterology

## 2014-10-18 ENCOUNTER — Ambulatory Visit (HOSPITAL_COMMUNITY): Payer: Medicare Other | Admitting: Anesthesiology

## 2014-10-18 ENCOUNTER — Ambulatory Visit (HOSPITAL_COMMUNITY)
Admission: RE | Admit: 2014-10-18 | Discharge: 2014-10-18 | Disposition: A | Discharge status: Home / Self Care | Payer: Medicare Other | Source: Ambulatory Visit | Attending: Gastroenterology | Admitting: Gastroenterology

## 2014-10-18 DIAGNOSIS — K5520 Angiodysplasia of colon without hemorrhage: Secondary | ICD-10-CM | POA: Insufficient documentation

## 2014-10-18 DIAGNOSIS — E669 Obesity, unspecified: Secondary | ICD-10-CM | POA: Diagnosis not present

## 2014-10-18 DIAGNOSIS — Z8673 Personal history of transient ischemic attack (TIA), and cerebral infarction without residual deficits: Secondary | ICD-10-CM | POA: Diagnosis not present

## 2014-10-18 DIAGNOSIS — I4891 Unspecified atrial fibrillation: Secondary | ICD-10-CM | POA: Insufficient documentation

## 2014-10-18 DIAGNOSIS — E1169 Type 2 diabetes mellitus with other specified complication: Secondary | ICD-10-CM | POA: Insufficient documentation

## 2014-10-18 DIAGNOSIS — E1140 Type 2 diabetes mellitus with diabetic neuropathy, unspecified: Secondary | ICD-10-CM | POA: Insufficient documentation

## 2014-10-18 DIAGNOSIS — Z79899 Other long term (current) drug therapy: Secondary | ICD-10-CM | POA: Insufficient documentation

## 2014-10-18 DIAGNOSIS — I2510 Atherosclerotic heart disease of native coronary artery without angina pectoris: Secondary | ICD-10-CM | POA: Insufficient documentation

## 2014-10-18 DIAGNOSIS — I739 Peripheral vascular disease, unspecified: Secondary | ICD-10-CM | POA: Diagnosis not present

## 2014-10-18 DIAGNOSIS — K219 Gastro-esophageal reflux disease without esophagitis: Secondary | ICD-10-CM | POA: Insufficient documentation

## 2014-10-18 DIAGNOSIS — I10 Essential (primary) hypertension: Secondary | ICD-10-CM | POA: Diagnosis not present

## 2014-10-18 DIAGNOSIS — D509 Iron deficiency anemia, unspecified: Secondary | ICD-10-CM | POA: Insufficient documentation

## 2014-10-18 DIAGNOSIS — Z87891 Personal history of nicotine dependence: Secondary | ICD-10-CM | POA: Diagnosis not present

## 2014-10-18 DIAGNOSIS — K922 Gastrointestinal hemorrhage, unspecified: Secondary | ICD-10-CM | POA: Diagnosis present

## 2014-10-18 DIAGNOSIS — E039 Hypothyroidism, unspecified: Secondary | ICD-10-CM | POA: Insufficient documentation

## 2014-10-18 HISTORY — DX: Pneumonia, unspecified organism: J18.9

## 2014-10-18 HISTORY — PX: HOT HEMOSTASIS: SHX5433

## 2014-10-18 HISTORY — PX: ESOPHAGOGASTRODUODENOSCOPY (EGD) WITH PROPOFOL: SHX5813

## 2014-10-18 SURGERY — ESOPHAGOGASTRODUODENOSCOPY (EGD) WITH PROPOFOL
Anesthesia: Monitor Anesthesia Care

## 2014-10-18 MED ORDER — PROPOFOL 10 MG/ML IV BOLUS
INTRAVENOUS | Status: DC | PRN
  Administered 2014-10-18: 50 mg via INTRAVENOUS

## 2014-10-18 MED ORDER — PROPOFOL INFUSION 10 MG/ML OPTIME
INTRAVENOUS | Status: DC | PRN
  Administered 2014-10-18: 160 ug/kg/min via INTRAVENOUS

## 2014-10-18 MED ORDER — LACTATED RINGERS IV SOLN
INTRAVENOUS | Status: DC
  Administered 2014-10-18: 1000 mL via INTRAVENOUS

## 2014-10-18 MED ORDER — SODIUM CHLORIDE 0.9 % IV SOLN: INTRAVENOUS | Status: DC

## 2014-10-18 MED ORDER — LIDOCAINE HCL (CARDIAC) 20 MG/ML IV SOLN
INTRAVENOUS | Status: DC | PRN
  Administered 2014-10-18: 50 mg via INTRAVENOUS

## 2014-10-18 MED ORDER — LIDOCAINE HCL (CARDIAC) 20 MG/ML IV SOLN
INTRAVENOUS | Status: AC
Start: 2014-10-18 — End: 2014-10-18
  Filled 2014-10-18: qty 5

## 2014-10-18 MED ORDER — PROPOFOL 10 MG/ML IV BOLUS
INTRAVENOUS | Status: AC
  Filled 2014-10-18: qty 20

## 2014-10-18 SURGICAL SUPPLY — 14 items

## 2014-10-18 NOTE — Discharge Instructions (Addendum)
Continue current meds including ironGastrointestinal Endoscopy, Care After Refer to this sheet in the next few weeks. These instructions provide you with information on caring for yourself after your procedure. Your caregiver may also give you more specific instructions. Your treatment has been planned according to current medical practices, but problems sometimes occur. Call your caregiver if you have any problems or questions after your procedure. HOME CARE INSTRUCTIONS  If you were given medicine to help you relax (sedative), do not drive, operate machinery, or sign important documents for 24 hours.  Avoid alcohol and hot or warm beverages for the first 24 hours after the procedure.  Only take over-the-counter or prescription medicines for pain, discomfort, or fever as directed by your caregiver. You may resume taking your normal medicines unless your caregiver tells you otherwise. Ask your caregiver when you may resume taking medicines that may cause bleeding, such as aspirin, clopidogrel, or warfarin.  You may return to your normal diet and activities on the day after your procedure, or as directed by your caregiver. Walking may help to reduce any bloated feeling in your abdomen.  Drink enough fluids to keep your urine clear or pale yellow.  You may gargle with salt water if you have a sore throat. SEEK IMMEDIATE MEDICAL CARE IF:  You have severe nausea or vomiting.  You have severe abdominal pain, abdominal cramps that last longer than 6 hours, or abdominal swelling (distention).  You have severe shoulder or back pain.  You have trouble swallowing.  You have shortness of breath, your breathing is shallow, or you are breathing faster than normal.  You have a fever or a rapid heartbeat.  You vomit blood or material that looks like coffee grounds.  You have bloody, black, or tarry stools. MAKE SURE YOU:  Understand these instructions.  Will watch your condition.  Will get  help right away if you are not doing well or get worse. Document Released: 04/07/2004 Document Revised: 01/08/2014 Document Reviewed: 11/24/2011 [facility] Patient Information 2015 [place], [place]. This information is not intended to replace advice given to you by your health care provider. Make sure you discuss any questions you have with your health care provider.

## 2014-10-18 NOTE — Transfer of Care (Signed)
Immediate Anesthesia Transfer of Care Note  Patient: KAILENA LUBAS  Procedure(s) Performed: Procedure(s): ESOPHAGOGASTRODUODENOSCOPY (EGD) WITH PROPOFOL (N/A) HOT HEMOSTASIS (ARGON PLASMA COAGULATION/BICAP) (N/A)  Patient Location: PACU  Anesthesia Type:MAC  Level of Consciousness: awake, alert  and oriented  Airway & Oxygen Therapy: Patient Spontanous Breathing and Patient connected to nasal cannula oxygen  Post-op Assessment: Report given to RN and Post -op Vital signs reviewed and stable  Post vital signs: Reviewed and stable  Last Vitals:  Filed Vitals:   10/18/14 0945  BP: 157/62  Pulse: 76  Temp: 36.8 C  Resp: 22    Complications: No apparent anesthesia complications

## 2014-10-18 NOTE — Op Note (Signed)
[facility] [place], [id number]   ENDOSCOPY PROCEDURE REPORT  PATIENT: Melissa Becker, Melissa Becker  MR#: #[id number] BIRTHDATE: 09-24-1945 , 13  yrs. old GENDER: female ENDOSCOPIST:Laurin Paulo Oletta Lamas, MD REFERRED BY: Dione Housekeeper, M.D.  Dola Argyle, M.D. 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  bleeding and 9 deficiency anemia.  Colonoscopy sieve removed polyps show no AVMs in the colon.  Her stools a been intermittently positive.  Previous capsules of the small bowel have not shown active small bowel bleeding.  She has had previous EGD showing small AVM in the stomach.  Due to lack of any other source EGD is repeated to evaluate for upper G.I.  source, cauterize known gastric AVM.Marland Kitchen MEDICATION: Monitored anesthesia care TOPICAL ANESTHETIC:   none  DESCRIPTION OF PROCEDURE:   After the risks and benefits of the procedure were explained, informed consent was obtained.  The Pentax Gastroscope [id number]  endoscope was introduced through the mouth  and advanced to the second portion of the duodenum .  The instrument was slowly withdrawn as the mucosa was fully examined.      ESOPHAGUS: The mucosa of the esophagus appeared normal.   No varices, esophagitis or significant hiatal hernia were seen.  STOMACH: Small AVM approximately 3 to 4 mm seen along the greater curve.  No ulcers any other active bleeding site seen in the entire stomach.  This small gastric AVM was cauterized with the APC using the circumferential probe.  There was no active bleeding.  DUODENUM: The duodenum was examined way down into the 2nd portion with no active bleeding and no further AVMs seen.          The scope was then withdrawn from the patient and the procedure completed.  COMPLICATIONS: There were no immediate complications.  ENDOSCOPIC IMPRESSION: 1.   The mucosa  of the esophagus appeared normal 2.   No varices, esophagitis or significant hiatal hernia were seen 3.   Small AVM approximately 3 to 4 mm seen along the greater curve. No ulcers any other active bleeding site seen in the entire stomach. there was no active bleeding in the stomach and it was not clear if this lesion had been actually responsible for bleeding or not. 4.   The duodenum was examined way down into the 2nd portion with no active bleeding and no further AVMs seen RECOMMENDATIONS: We will continue the patient on her current medications, continue to follow her stools and hemoglobin and see back in the office in the next several weeks.   _______________________________ Lorrin MaisLaurence Spates, MD 10/18/2014 11:40 AM     cc: Carlena Bjornstad, MD Dione Housekeeper, MD  CPT CODES: ICD CODES:  The ICD and CPT codes recommended by this software are interpretations from the data that the clinical staff has captured with the software.  The verification of the translation of this report to the ICD and CPT codes and modifiers is the sole responsibility of the health care institution and practicing physician where this report was generated.  [employer]. will not be held responsible for the validity of the ICD and CPT codes included on this report.  AMA assumes no liability for data contained or not contained herein. CPT is a Designer, television/film set of the [employer].  PATIENT NAME:  [name], [name] MR#: #[id number]

## 2014-10-18 NOTE — H&P (Signed)
Subjective:   Patient is a 69 y.o. female presents with chronic anemia felt to be due to chronic G.I. bleeding. She's been quite complicated and has had previous colonoscopies with the removal of small polyps without clear AVMs are:Melissa Becker She's had intermittently positive stools and chronic iron deficiency with need for IV iron in IV transfusions. She did have EGD 8/14 showing AVM and the stomach that was not actively bleeding. Most recent hemoglobin was 9.8. [place] city, with some iron but she had had intermittently positive stools iron saturation was still quite lowest 10%. At this point she is going to have EGD to see if there is a visible AVM of the stomach or duodenum that could be cauterized. This procedure has been explained extensively to the patient in the office.. Procedure including risks and benefits discussed in office.  Patient Active Problem List   Diagnosis Date Noted  . Edema 09/12/2014  . Exertional shortness of breath 08/20/2014  . Fatigue 08/20/2014  . Anemia 08/20/2014  . PAD (peripheral artery disease) 08/20/2014  . Pain in limb 05/01/2014  . RBBB (right bundle branch block) 08/24/2013  . Ejection fraction   . Aortic stenosis   . Mitral regurgitation   . Latex allergy   . TIA (transient ischemic attack)   . CAD (coronary artery disease)   . Anemia due to acute blood loss 04/18/2013  . GI bleed 04/18/2013  . Mobitz type 1 second degree AV block 04/18/2013  . HTN (hypertension) 04/18/2013  . Diabetes mellitus type 2 in obese 04/18/2013   Past Medical History  Diagnosis Date  . Hypothyroid   . Type 2 diabetes mellitus   . Hypertension   . TIA (transient ischemic attack)     history of TIAs  . Neuropathy   . Ejection fraction   . Aortic stenosis   . Mitral regurgitation   . Latex allergy     Itching  . GI bleed 2014  . CAD (coronary artery disease)   . GERD (gastroesophageal reflux disease)   . Anemia   . Allergy   . Atrial fibrillation   . Stroke 6 yrs  ago     TIA  . Pneumonia 10-15 years ago    Past Surgical History  Procedure Laterality Date  . Tonsillectomy    . Cholecystectomy    . Ventral hernia repair    . Esophagogastroduodenoscopy N/A 04/15/2013    Procedure: ESOPHAGOGASTRODUODENOSCOPY (EGD);  Surgeon: Wonda Horner, MD;  Location: [facility] ENDOSCOPY;  Service: Endoscopy;  Laterality: N/A;  . Givens capsule study N/A 04/18/2013    Procedure: GIVENS CAPSULE STUDY;  Surgeon: Wonda Horner, MD;  Location: [facility] ENDOSCOPY;  Service: Endoscopy;  Laterality: N/A;  . Tonsillectomy    . Hernia repair    . Right finger surgery  1982    amputation of right ring finger  . Right toe surgery      right toe surgery-next to big toe amputated  . Colonoscopy with propofol N/A 12/22/2013    Procedure: COLONOSCOPY WITH PROPOFOL;  Surgeon: Winfield Cunas., MD;  Location: [facility] ENDOSCOPY;  Service: Endoscopy;  Laterality: N/A;  . Left heart catheterization with coronary angiogram N/A 04/17/2013    Procedure: LEFT HEART CATHETERIZATION WITH CORONARY ANGIOGRAM;  Surgeon: Peter M Martinique, MD;  Location: [facility] CATH LAB;  Service: Cardiovascular;  Laterality: N/A;  . Abdominal hysterectomy      partial    Prescriptions prior to admission  Medication Sig Dispense Refill Last Dose  . amLODipine (  NORVASC) 10 MG tablet Take 10 mg by mouth every morning.   10/18/2014 at Unknown time  . atorvastatin (LIPITOR) 80 MG tablet Take 80 mg by mouth every evening.   10/17/2014 at Unknown time  . benazepril (LOTENSIN) 40 MG tablet Take 40 mg by mouth every morning.   10/18/2014 at Unknown time  . DULoxetine (CYMBALTA) 60 MG capsule Take 60 mg by mouth every evening.   10/17/2014 at Unknown time  . ergocalciferol (VITAMIN D2) 50000 UNITS capsule Take 50,000 Units by mouth once a week.   10/17/2014 at Unknown time  . Ferrous Sulfate (IRON) 325 (65 FE) MG TABS Take 1 tablet by mouth 3 (three) times daily.    Past Week at Unknown time  . fluticasone (FLONASE) 50 MCG/ACT nasal spray Place 2  sprays into both nostrils as needed for allergies. prn   10/18/2014 at Unknown time  . furosemide (LASIX) 20 MG tablet Take 1 tablet (20 mg total) by mouth daily. 30 tablet 3 10/17/2014 at Unknown time  . glimepiride (AMARYL) 4 MG tablet Take 4 mg by mouth 2 (two) times daily.    10/17/2014 at Unknown time  . HYDROcodone-acetaminophen (NORCO/VICODIN) 5-325 MG per tablet Take 1 tablet by mouth every 6 (six) hours as needed for pain. For pain   Past Month at Unknown time  . levothyroxine (SYNTHROID, LEVOTHROID) 100 MCG tablet Take 100 mcg by mouth daily before breakfast.   10/18/2014 at Unknown time  . metFORMIN (GLUCOPHAGE) 1000 MG tablet Take 1,000 mg by mouth 2 (two) times daily with a meal.   10/17/2014 at Unknown time  . pantoprazole (PROTONIX) 40 MG tablet Take 1 tablet (40 mg total) by mouth 2 (two) times daily. 60 tablet 3 10/17/2014 at Unknown time   Allergies  Allergen Reactions  . Sulfamethoxazole-Trimethoprim Other (See Comments)    Headaches  . Latex Itching    whelps    History  Substance Use Topics  . Smoking status: Former Smoker -- 2.00 packs/day for 8 years    Types: Cigarettes    Start date: 07/12/1963    Quit date: 10/27/1988  . Smokeless tobacco: Never Used  . Alcohol Use: No    Family History  Problem Relation Age of Onset  . Colon cancer Father   . Cancer Father   . Deep vein thrombosis Father   . Diabetes Father   . Heart disease Father   . Cancer Mother   . Heart disease Son   . Varicose Veins Son      Objective:   Patient Vitals for the past 8 hrs:  BP Temp Temp src Pulse Resp  10/18/14 0945 (!) 157/62 mmHg 98.2 F (36.8 C) Oral 76 (!) 22         See MD Preop evaluation      Assessment:   1. Chronic iron deficiency anemia. This is felt to be due to chronic G.I. bleeding probably due to AVMs. 2. Type II diabetes with neuropathy 3. History of TIAs 4. History of atrial fib  Plan:   We will proceed at this time with EGD with plans to  cauterize any AVMs seen in the upper G.I. track.

## 2014-10-18 NOTE — Addendum Note (Signed)
Addended by: Vernie Ammons. on: 10/18/2014 09:55 AM   Modules accepted: Orders

## 2014-10-18 NOTE — Anesthesia Postprocedure Evaluation (Signed)
  Anesthesia Post-op Note  Patient: Melissa Becker  Procedure(s) Performed: Procedure(s) (LRB): ESOPHAGOGASTRODUODENOSCOPY (EGD) WITH PROPOFOL (N/A) HOT HEMOSTASIS (ARGON PLASMA COAGULATION/BICAP) (N/A)  Patient Location: PACU  Anesthesia Type: MAC  Level of Consciousness: awake and alert   Airway and Oxygen Therapy: Patient Spontanous Breathing  Post-op Pain: mild  Post-op Assessment: Post-op Vital signs reviewed, Patient's Cardiovascular Status Stable, Respiratory Function Stable, Patent Airway and No signs of Nausea or vomiting  Last Vitals:  Filed Vitals:   10/18/14 1135  BP: 148/68  Pulse: 56  Temp: 37 C  Resp: 18    Post-op Vital Signs: stable   Complications: No apparent anesthesia complications

## 2014-10-19 ENCOUNTER — Encounter (HOSPITAL_COMMUNITY): Payer: Self-pay | Admitting: Gastroenterology

## 2014-10-19 LAB — GLUCOSE, CAPILLARY: Glucose-Capillary: 158 mg/dL — ABNORMAL HIGH (ref 70–99)

## 2014-12-27 ENCOUNTER — Encounter: Payer: Self-pay | Admitting: Cardiology

## 2014-12-27 ENCOUNTER — Ambulatory Visit (INDEPENDENT_AMBULATORY_CARE_PROVIDER_SITE_OTHER): Payer: Medicare Other | Admitting: Cardiology

## 2014-12-27 VITALS — BP 138/70 | HR 79 | Ht 70.0 in | Wt 248.0 lb

## 2014-12-27 DIAGNOSIS — R0602 Shortness of breath: Secondary | ICD-10-CM

## 2014-12-27 DIAGNOSIS — I441 Atrioventricular block, second degree: Secondary | ICD-10-CM

## 2014-12-27 DIAGNOSIS — I2510 Atherosclerotic heart disease of native coronary artery without angina pectoris: Secondary | ICD-10-CM | POA: Diagnosis not present

## 2014-12-27 DIAGNOSIS — D500 Iron deficiency anemia secondary to blood loss (chronic): Secondary | ICD-10-CM | POA: Diagnosis not present

## 2014-12-27 DIAGNOSIS — I350 Nonrheumatic aortic (valve) stenosis: Secondary | ICD-10-CM | POA: Diagnosis not present

## 2014-12-27 NOTE — Assessment & Plan Note (Signed)
Her exertional shortness of breath is better since her anemia is treated.

## 2014-12-27 NOTE — Assessment & Plan Note (Signed)
It appears that her anemia was related to bleeding AVMs. She is on iron. Her hemoglobin is up. She feels better.

## 2014-12-27 NOTE — Assessment & Plan Note (Signed)
We now the patient has Mobitz type I second-degree AV block. We know that at the time of her catheterization in 2014, she had some limited persistent 2-1 block. She has 2-1 block that was documented during her recent foot procedure. She has no symptoms. I decided to start with a 48 hour monitor to get a feel for her rhythm in general. She is on no medicines that would affect her AV node conduction.

## 2014-12-27 NOTE — Assessment & Plan Note (Addendum)
Aortic stenosis is not severe. No further workup at this time.Melissa Becker

## 2014-12-27 NOTE — Progress Notes (Signed)
Cardiology Office Note   Date:  12/27/2014   ID:  Melissa Becker, DOB [birth year], MRN [id number]  PCP:  Sherrie Mustache, MD  Cardiologist:  Dola Argyle, MD   Chief Complaint  Patient presents with  . Appointment    Follow-up coronary disease      History of Present Illness: Melissa Becker is a 69 y.o. female who presents today to follow up coronary disease. She is also here to follow-up documented episode of asymptomatic 2-1 AV block. I saw the patient last January, 2016. At that time I felt that her symptoms were most likely related to anemia. This turned out to be the case. Her anemia has been treated. She has had some GI studies showing AVMs. She feels better in that regard. She did require surgery on a toe on her right foot within the past 2 weeks. She has brought me copies of the monitor strip obtained during one part of her procedure. It shows 2-1 AV block. We know that she has had this in the past under stressful situations.    Past Medical History  Diagnosis Date  . Hypothyroid   . Type 2 diabetes mellitus   . Hypertension   . TIA (transient ischemic attack)     history of TIAs  . Neuropathy   . Ejection fraction   . Aortic stenosis   . Mitral regurgitation   . Latex allergy     Itching  . GI bleed 2014  . CAD (coronary artery disease)   . GERD (gastroesophageal reflux disease)   . Anemia   . Allergy   . Atrial fibrillation   . Stroke 6 yrs  ago    TIA  . Pneumonia 10-15 years ago    Past Surgical History  Procedure Laterality Date  . Tonsillectomy    . Cholecystectomy    . Ventral hernia repair    . Esophagogastroduodenoscopy N/A 04/15/2013    Procedure: ESOPHAGOGASTRODUODENOSCOPY (EGD);  Surgeon: Wonda Horner, MD;  Location: [facility] ENDOSCOPY;  Service: Endoscopy;  Laterality: N/A;  . Givens capsule study N/A 04/18/2013    Procedure: GIVENS CAPSULE STUDY;  Surgeon: Wonda Horner, MD;  Location: [facility] ENDOSCOPY;  Service: Endoscopy;  Laterality: N/A;  .  Tonsillectomy    . Hernia repair    . Right finger surgery  1982    amputation of right ring finger  . Right toe surgery      right toe surgery-next to big toe amputated  . Colonoscopy with propofol N/A 12/22/2013    Procedure: COLONOSCOPY WITH PROPOFOL;  Surgeon: Winfield Cunas., MD;  Location: [facility] ENDOSCOPY;  Service: Endoscopy;  Laterality: N/A;  . Left heart catheterization with coronary angiogram N/A 04/17/2013    Procedure: LEFT HEART CATHETERIZATION WITH CORONARY ANGIOGRAM;  Surgeon: Peter M Martinique, MD;  Location: [facility] CATH LAB;  Service: Cardiovascular;  Laterality: N/A;  . Abdominal hysterectomy      partial  . Esophagogastroduodenoscopy (egd) with propofol N/A 10/18/2014    Procedure: ESOPHAGOGASTRODUODENOSCOPY (EGD) WITH PROPOFOL;  Surgeon: Winfield Cunas., MD;  Location: [facility] ENDOSCOPY;  Service: Endoscopy;  Laterality: N/A;  . Hot hemostasis N/A 10/18/2014    Procedure: HOT HEMOSTASIS (ARGON PLASMA COAGULATION/BICAP);  Surgeon: Winfield Cunas., MD;  Location: [facility] ENDOSCOPY;  Service: Endoscopy;  Laterality: N/A;    Patient Active Problem List   Diagnosis Date Noted  . Edema 09/12/2014  . Exertional shortness of breath 08/20/2014  . Fatigue 08/20/2014  . Anemia 08/20/2014  .  PAD (peripheral artery disease) 08/20/2014  . Pain in limb 05/01/2014  . RBBB (right bundle branch block) 08/24/2013  . Ejection fraction   . Aortic stenosis   . Mitral regurgitation   . Latex allergy   . TIA (transient ischemic attack)   . CAD (coronary artery disease)   . Anemia due to acute blood loss 04/18/2013  . GI bleed 04/18/2013  . Mobitz type 1 second degree AV block 04/18/2013  . HTN (hypertension) 04/18/2013  . Diabetes mellitus type 2 in obese 04/18/2013      Current Outpatient Prescriptions  Medication Sig Dispense Refill  . amLODipine (NORVASC) 10 MG tablet Take 10 mg by mouth every morning.    Marland Kitchen atorvastatin (LIPITOR) 80 MG tablet Take 80 mg by mouth every evening.    .  benazepril (LOTENSIN) 40 MG tablet Take 40 mg by mouth every morning.    . DULoxetine (CYMBALTA) 60 MG capsule Take 60 mg by mouth every evening.    . ergocalciferol (VITAMIN D2) 50000 UNITS capsule Take 50,000 Units by mouth once a week.    . Ferrous Sulfate (IRON) 325 (65 FE) MG TABS Take 1 tablet by mouth 3 (three) times daily.     . fluticasone (FLONASE) 50 MCG/ACT nasal spray Place 2 sprays into both nostrils as needed for allergies. prn    . furosemide (LASIX) 20 MG tablet Take 1 tablet (20 mg total) by mouth daily. 30 tablet 3  . glimepiride (AMARYL) 4 MG tablet Take 4 mg by mouth 2 (two) times daily.     Marland Kitchen HYDROcodone-acetaminophen (NORCO/VICODIN) 5-325 MG per tablet Take 1 tablet by mouth every 6 (six) hours as needed for pain. For pain    . levothyroxine (SYNTHROID, LEVOTHROID) 100 MCG tablet Take 100 mcg by mouth daily before breakfast.    . metFORMIN (GLUCOPHAGE) 1000 MG tablet Take 1,000 mg by mouth 2 (two) times daily with a meal.    . pantoprazole (PROTONIX) 40 MG tablet Take 1 tablet (40 mg total) by mouth 2 (two) times daily. 60 tablet 3   No current facility-administered medications for this visit.    Allergies:   Sulfamethoxazole-trimethoprim and Latex    Social History:  The patient  reports that she quit smoking about 26 years ago. Her smoking use included Cigarettes. She started smoking about 51 years ago. She has a 16 pack-year smoking history. She has never used smokeless tobacco. She reports that she does not drink alcohol or use illicit drugs.   Family History:  The patient's family history includes Cancer in her father and mother; Colon cancer in her father; Deep vein thrombosis in her father; Diabetes in her father; Heart disease in her father and son; Varicose Veins in her son.    ROS:  Please see the history of present illness.    Patient denies fever, chills, headache, sweats, rash, change in vision, change in hearing, chest pain, cough, nausea or vomiting,  urinary symptoms. All other systems are reviewed and are negative.     PHYSICAL EXAM: VS:  BP 138/70 mmHg  Pulse 79  Ht 5\' 10"  (1.778 m)  Wt 248 lb (112.492 kg)  BMI 35.58 kg/m2  SpO2 91% , The patient is here with her daughter. She is oriented to person time and place. Affect is normal. Head is atraumatic. Sclerae and conjunctiva are normal. There is no jugular venous distention. Lungs are clear. Respiratory effort is not labored. Cardiac exam reveals S1 and S2. Abdomen is soft. There is  no edema in the left leg. She has a walking boot on the right foot after her recent surgery. There are no skin rashes. Neurologic is grossly intact.  EKG:   EKG is not done today. I have strips from the day of her surgery showing 2-1 AV block.   Recent Labs: No results found for requested labs within last 365 days.    Lipid Panel    Component Value Date/Time   CHOL 104 04/15/2013 0450   TRIG 95 04/15/2013 0450   HDL 36* 04/15/2013 0450   CHOLHDL 2.9 04/15/2013 0450   VLDL 19 04/15/2013 0450   LDLCALC 49 04/15/2013 0450      Wt Readings from Last 3 Encounters:  12/27/14 248 lb (112.492 kg)  09/12/14 247 lb (112.038 kg)  08/20/14 253 lb (114.76 kg)      Current medicines are reviewed  The patient understands her medications.     ASSESSMENT AND PLAN:

## 2014-12-27 NOTE — Patient Instructions (Signed)
Your physician recommends that you continue on your current medications as directed. Please refer to the Current Medication list given to you today.  Your physician has recommended that you wear a 48 hour holter monitor. Holter monitors are medical devices that record the heart's electrical activity. Doctors most often use these monitors to diagnose arrhythmias. Arrhythmias are problems with the speed or rhythm of the heartbeat. The monitor is a small, portable device. You can wear one while you do your normal daily activities. This is usually used to diagnose what is causing palpitations/syncope (passing out).  Your physician recommends that you schedule a follow-up appointment in: 4 weeks.

## 2014-12-27 NOTE — Assessment & Plan Note (Signed)
The patient has nonobstructive coronary disease by catheterization in 2014.

## 2015-01-21 ENCOUNTER — Ambulatory Visit (INDEPENDENT_AMBULATORY_CARE_PROVIDER_SITE_OTHER): Payer: PRIVATE HEALTH INSURANCE

## 2015-01-21 ENCOUNTER — Other Ambulatory Visit: Payer: Self-pay | Admitting: *Deleted

## 2015-01-21 ENCOUNTER — Telehealth: Payer: Self-pay | Admitting: *Deleted

## 2015-01-21 DIAGNOSIS — I441 Atrioventricular block, second degree: Secondary | ICD-10-CM

## 2015-01-21 NOTE — Telephone Encounter (Signed)
Patient informed of stable results per Ron Parker.

## 2015-01-23 ENCOUNTER — Ambulatory Visit: Payer: PRIVATE HEALTH INSURANCE | Admitting: Cardiology

## 2015-01-28 ENCOUNTER — Ambulatory Visit (INDEPENDENT_AMBULATORY_CARE_PROVIDER_SITE_OTHER): Payer: Medicare Other | Admitting: Cardiology

## 2015-01-28 ENCOUNTER — Ambulatory Visit: Payer: PRIVATE HEALTH INSURANCE | Admitting: Cardiology

## 2015-01-28 ENCOUNTER — Encounter: Payer: Self-pay | Admitting: Cardiology

## 2015-01-28 VITALS — BP 180/78 | HR 83 | Ht 68.0 in | Wt 247.0 lb

## 2015-01-28 DIAGNOSIS — I350 Nonrheumatic aortic (valve) stenosis: Secondary | ICD-10-CM | POA: Diagnosis not present

## 2015-01-28 DIAGNOSIS — I441 Atrioventricular block, second degree: Secondary | ICD-10-CM | POA: Diagnosis not present

## 2015-01-28 NOTE — Assessment & Plan Note (Signed)
The patient has a history of GI bleeding. She was transfused in 2014. She has ongoing AVMs. She was receiving Aggrenox in the past for TIAs, this was stopped in the past.

## 2015-01-28 NOTE — Assessment & Plan Note (Signed)
The patient's aortic stenosis is mild. When I see her back we will consider the possibility of a follow-up echo.

## 2015-01-28 NOTE — Assessment & Plan Note (Signed)
The patient has no symptoms from her intermittent AV block. This is to be followed.

## 2015-01-28 NOTE — Assessment & Plan Note (Signed)
There is a history of TIAs. The patient is worried because she had a slight numb sensation in her right lower lip recently. She is fine at this time. It is my understanding that she was not able to undergo MRI successfully in the past due to motion. She had been on Aggrenox. This was stopped when she had GI bleeding. The question now is whether or not she can receive aspirin or Plavix. I feel it cannot be used at this time. I've asked her to follow-up with both her primary physician and with gastroenterology to get further input concerning this question. From the cardiovascular viewpoint, and antiplatelet agent would be optimal if it is safe for her.  As part of today's evaluation I spent greater than 25 minutes with her total care. More than half of this time was spent with direct contact reviewing the complete history with the patient and talking about further evaluation.

## 2015-01-28 NOTE — Patient Instructions (Signed)
Your physician recommends that you schedule a follow-up appointment in: 3 months with Dr. Ron Parker  Your physician recommends that you continue on your current medications as directed. Please refer to the Current Medication list given to you today.  Thank you for choosing [facility]!!

## 2015-01-28 NOTE — Progress Notes (Signed)
Cardiology Office Note   Date:  01/28/2015   ID:  Saidee, Geremia Sep 02, 1946, MRN [id number]  PCP:  Sherrie Mustache, MD  Cardiologist:  Dola Argyle, MD   Chief Complaint  Patient presents with  . Appointment    Follow-up 2-1 AV block      History of Present Illness: AGAM TUOHY is a 69 y.o. 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 I saw her last December 27, 2014. 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 During most recent endoscopy she was noted to have 2-1 AV block. We are aware that she's had this in the past. Electrophysiology has felt that she can be followed as she has no symptoms from this. I arranged for a 48 hour Holter monitor. This study showed intermittent second-degree AV block both type I and type II. There was no change from the past. It was felt that her rhythm could be followed. I'm seeing her back in the office today and she's feeling well. Her overall cardiac status is stable.  The patient has a history of TIAs in the past. She was on Aggrenox and this was stopped. Most recently in February, 2016 she had follow-up endoscopy for her AVMs. It was felt that she should have continued follow-up from GI. The patient is concerned because she is on no antiplatelet medicine with her history of TIAs.  As part of this evaluation I have researched her older records to be sure that I had the exact history concerning medications for her TIAs. Since her Aggrenox was stopped, she has not been treated.    Past Medical History  Diagnosis Date  . Hypothyroid   . Type 2 diabetes mellitus   . Hypertension   . TIA (transient ischemic attack)     history of TIAs  . Neuropathy   . Ejection fraction   . Aortic stenosis   . Mitral regurgitation   . Latex allergy     Itching  . GI bleed 2014  . CAD (coronary artery disease)   . GERD (gastroesophageal reflux disease)   . Anemia   . Allergy   . Atrial fibrillation   . Stroke 6 yrs   ago    TIA  . Pneumonia 10-15 years ago    Past Surgical History  Procedure Laterality Date  . Tonsillectomy    . Cholecystectomy    . Ventral hernia repair    . Esophagogastroduodenoscopy N/A 04/15/2013    Procedure: ESOPHAGOGASTRODUODENOSCOPY (EGD);  Surgeon: Wonda Horner, MD;  Location: [facility] ENDOSCOPY;  Service: Endoscopy;  Laterality: N/A;  . Givens capsule study N/A 04/18/2013    Procedure: GIVENS CAPSULE STUDY;  Surgeon: Wonda Horner, MD;  Location: [facility] ENDOSCOPY;  Service: Endoscopy;  Laterality: N/A;  . Tonsillectomy    . Hernia repair    . Right finger surgery  1982    amputation of right ring finger  . Right toe surgery      right toe surgery-next to big toe amputated  . Colonoscopy with propofol N/A 12/22/2013    Procedure: COLONOSCOPY WITH PROPOFOL;  Surgeon: Winfield Cunas., MD;  Location: [facility] ENDOSCOPY;  Service: Endoscopy;  Laterality: N/A;  . Left heart catheterization with coronary angiogram N/A 04/17/2013    Procedure: LEFT HEART CATHETERIZATION WITH CORONARY ANGIOGRAM;  Surgeon: Peter M Martinique, MD;  Location: [facility] CATH LAB;  Service: Cardiovascular;  Laterality: N/A;  . Abdominal hysterectomy      partial  .  Esophagogastroduodenoscopy (egd) with propofol N/A 10/18/2014    Procedure: ESOPHAGOGASTRODUODENOSCOPY (EGD) WITH PROPOFOL;  Surgeon: Winfield Cunas., MD;  Location: [facility] ENDOSCOPY;  Service: Endoscopy;  Laterality: N/A;  . Hot hemostasis N/A 10/18/2014    Procedure: HOT HEMOSTASIS (ARGON PLASMA COAGULATION/BICAP);  Surgeon: Winfield Cunas., MD;  Location: [facility] ENDOSCOPY;  Service: Endoscopy;  Laterality: N/A;    Patient Active Problem List   Diagnosis Date Noted  . Edema 09/12/2014  . Exertional shortness of breath 08/20/2014  . Fatigue 08/20/2014  . Anemia 08/20/2014  . PAD (peripheral artery disease) 08/20/2014  . Pain in limb 05/01/2014  . RBBB (right bundle branch block) 08/24/2013  . Ejection fraction   . Aortic stenosis   . Mitral regurgitation     . Latex allergy   . TIA (transient ischemic attack)   . CAD (coronary artery disease)   . Anemia due to acute blood loss 04/18/2013  . GI bleed 04/18/2013  . Mobitz type 1 second degree AV block 04/18/2013  . HTN (hypertension) 04/18/2013  . Diabetes mellitus type 2 in obese 04/18/2013      Current Outpatient Prescriptions  Medication Sig Dispense Refill  . amLODipine (NORVASC) 10 MG tablet Take 10 mg by mouth every morning.    Marland Kitchen atorvastatin (LIPITOR) 80 MG tablet Take 80 mg by mouth every evening.    . benazepril (LOTENSIN) 40 MG tablet Take 40 mg by mouth every morning.    . DULoxetine (CYMBALTA) 60 MG capsule Take 60 mg by mouth every evening.    . ergocalciferol (VITAMIN D2) 50000 UNITS capsule Take 50,000 Units by mouth once a week.    . Ferrous Sulfate (IRON) 325 (65 FE) MG TABS Take 1 tablet by mouth 3 (three) times daily.     . fluticasone (FLONASE) 50 MCG/ACT nasal spray Place 2 sprays into both nostrils as needed for allergies. prn    . furosemide (LASIX) 20 MG tablet Take 1 tablet (20 mg total) by mouth daily. 30 tablet 3  . glimepiride (AMARYL) 4 MG tablet Take 4 mg by mouth 2 (two) times daily.     Marland Kitchen HYDROcodone-acetaminophen (NORCO/VICODIN) 5-325 MG per tablet Take 1 tablet by mouth every 6 (six) hours as needed for pain. For pain    . levothyroxine (SYNTHROID, LEVOTHROID) 100 MCG tablet Take 100 mcg by mouth daily before breakfast.    . metFORMIN (GLUCOPHAGE) 1000 MG tablet Take 1,000 mg by mouth 2 (two) times daily with a meal.    . pantoprazole (PROTONIX) 40 MG tablet Take 1 tablet (40 mg total) by mouth 2 (two) times daily. 60 tablet 3   No current facility-administered medications for this visit.    Allergies:   Sulfamethoxazole-trimethoprim and Latex    Social History:  The patient  reports that she quit smoking about 26 years ago. Her smoking use included Cigarettes. She started smoking about 51 years ago. She has a 16 pack-year smoking history. She has never  used smokeless tobacco. She reports that she does not drink alcohol or use illicit drugs.   Family History:  The patient's family history includes Cancer in her father and mother; Colon cancer in her father; Deep vein thrombosis in her father; Diabetes in her father; Heart disease in her father and son; Varicose Veins in her son.    ROS:  Please see the history of present illness.     Patient denies fever, chills, headache, sweats, rash, change in vision, change in hearing, chest pain, cough,  nausea or vomiting, urinary symptoms. All other systems are reviewed and are negative.   PHYSICAL EXAM: VS:  BP 180/78 mmHg  Pulse 83  Ht 5\' 8"  (1.727 m)  Wt 247 lb (112.038 kg)  BMI 37.56 kg/m2  SpO2 94% , patient is oriented to person time and place. Affect is normal. Head is atraumatic. There is no jugular venous distention. Lungs are clear. Respiratory effort is nonlabored. Cardiac exam reveals an S1 and S2. The abdomen is soft. There is no peripheral edema. There are no musculoskeletal deformities. There are no skin rashes. She does have a systolic murmur consistent with her aortic stenosis.  EKG:   EKG is not done today. Her 48 hour Holter revealed intermittent 2-1 AV block that was stable.   Recent Labs: No results found for requested labs within last 365 days.    Lipid Panel    Component Value Date/Time   CHOL 104 04/15/2013 0450   TRIG 95 04/15/2013 0450   HDL 36* 04/15/2013 0450   CHOLHDL 2.9 04/15/2013 0450   VLDL 19 04/15/2013 0450   LDLCALC 49 04/15/2013 0450      Wt Readings from Last 3 Encounters:  01/28/15 247 lb (112.038 kg)  12/27/14 248 lb (112.492 kg)  09/12/14 247 lb (112.038 kg)      Current medicines are reviewed  The patient understands her medications.   ASSESSMENT AND PLAN:

## 2015-01-31 ENCOUNTER — Telehealth: Payer: Self-pay | Admitting: *Deleted

## 2015-01-31 NOTE — Telephone Encounter (Signed)
Patient notified and verbalized understanding. 

## 2015-01-31 NOTE — Telephone Encounter (Signed)
Melissa Bjornstad, MD  Sent: Mon Jan 28, 2015 3:46 PM   To: Laurine Blazer, LPN     Message    Please call the patient. She wanted my advice about whether she should take a medicine such as aspirin or Plavix. Please tell her to make a follow-up appointment with Dr. Laurence Spates of GI. We need his advice concerning what medicines she could possibly take. He has done her recent endoscopy. From the cardiac viewpoint I would be in favor of her being on aspirin or Plavix because of her history of TIAs. However these medicines can only be used if Dr. Oletta Lamas and her primary physician Dr. Edrick Oh feel it is safe. They may feel that her bleeding risk is too high to take one of these medicines.

## 2015-05-08 ENCOUNTER — Ambulatory Visit (INDEPENDENT_AMBULATORY_CARE_PROVIDER_SITE_OTHER): Payer: Medicare Other | Admitting: Cardiology

## 2015-05-08 ENCOUNTER — Encounter: Payer: Self-pay | Admitting: Cardiology

## 2015-05-08 VITALS — BP 159/77 | HR 75 | Ht 68.0 in | Wt 246.4 lb

## 2015-05-08 DIAGNOSIS — R0989 Other specified symptoms and signs involving the circulatory and respiratory systems: Secondary | ICD-10-CM | POA: Diagnosis not present

## 2015-05-08 DIAGNOSIS — I350 Nonrheumatic aortic (valve) stenosis: Secondary | ICD-10-CM

## 2015-05-08 DIAGNOSIS — I441 Atrioventricular block, second degree: Secondary | ICD-10-CM | POA: Diagnosis not present

## 2015-05-08 NOTE — Assessment & Plan Note (Signed)
There is a history of mild aortic stenosis. Her murmur sure. Second heart sound is well heard. I've chosen not to repeat an echo at this time. It may be appropriate to proceed with an echo after she is seen in 6 months in follow-up.

## 2015-05-08 NOTE — Patient Instructions (Signed)
Your physician recommends that you continue on your current medications as directed. Please refer to the Current Medication list given to you today. Your physician has requested that you have a carotid duplex. This test is an ultrasound of the carotid arteries in your neck. It looks at blood flow through these arteries that supply the brain with blood. Allow one hour for this exam. There are no restrictions or special instructions. Your physician recommends that you schedule a follow-up appointment in: 6 months. You will receive a reminder letter in the mail in about 4 months reminding you to call and schedule your appointment. If you don't receive this letter, please contact our office. 

## 2015-05-08 NOTE — Progress Notes (Signed)
Cardiology Office Note   Date:  05/08/2015   ID:  [name], [name] 09-19-1945, MRN [id number]  PCP:  Sherrie Mustache, MD  Cardiologist:  Dola Argyle, MD   Chief Complaint  Patient presents with  . Appointment    Follow-up history of aortic stenosis      History of Present Illness: Melissa Becker is a 69 y.o. female who presents today to follow-up aortic stenosis. She has mild aortic stenosis by history. She's also had a history of some 2-1 AV block. This was noted in particular during an endoscopy earlier this year. This was actually assessed by electrophysiology who felt that she could be followed based on symptoms. She is not having any significant symptoms.  There is a history of mild aortic stenosis by echo 2 years ago.    Past Medical History  Diagnosis Date  . Hypothyroid   . Type 2 diabetes mellitus   . Hypertension   . TIA (transient ischemic attack)     history of TIAs  . Neuropathy   . Ejection fraction   . Aortic stenosis   . Mitral regurgitation   . Latex allergy     Itching  . GI bleed 2014  . CAD (coronary artery disease)   . GERD (gastroesophageal reflux disease)   . Anemia   . Allergy   . Atrial fibrillation   . Stroke 6 yrs  ago    TIA  . Pneumonia 10-15 years ago    Past Surgical History  Procedure Laterality Date  . Tonsillectomy    . Cholecystectomy    . Ventral hernia repair    . Esophagogastroduodenoscopy N/A 04/15/2013    Procedure: ESOPHAGOGASTRODUODENOSCOPY (EGD);  Surgeon: Wonda Horner, MD;  Location: [facility] ENDOSCOPY;  Service: Endoscopy;  Laterality: N/A;  . Givens capsule study N/A 04/18/2013    Procedure: GIVENS CAPSULE STUDY;  Surgeon: Wonda Horner, MD;  Location: [facility] ENDOSCOPY;  Service: Endoscopy;  Laterality: N/A;  . Tonsillectomy    . Hernia repair    . Right finger surgery  1982    amputation of right ring finger  . Right toe surgery      right toe surgery-next to big toe amputated  . Colonoscopy with propofol N/A  12/22/2013    Procedure: COLONOSCOPY WITH PROPOFOL;  Surgeon: Winfield Cunas., MD;  Location: [facility] ENDOSCOPY;  Service: Endoscopy;  Laterality: N/A;  . Left heart catheterization with coronary angiogram N/A 04/17/2013    Procedure: LEFT HEART CATHETERIZATION WITH CORONARY ANGIOGRAM;  Surgeon: Peter M Martinique, MD;  Location[facility] CATH LAB;  Service: Cardiovascular;  Laterality: N/A;  . Abdominal hysterectomy      partial  . Esophagogastroduodenoscopy (egd) with propofol N/A 10/18/2014    Procedure: ESOPHAGOGASTRODUODENOSCOPY (EGD) WITH PROPOFOL;  Surgeon: Winfield Cunas., MD;  Location: [facility] ENDOSCOPY;  Service: Endoscopy;  Laterality: N/A;  . Hot hemostasis N/A 10/18/2014    Procedure: HOT HEMOSTASIS (ARGON PLASMA COAGULATION/BICAP);  Surgeon: Winfield Cunas., MD;  Location: [facility] ENDOSCOPY;  Service: Endoscopy;  Laterality: N/A;    Patient Active Problem List   Diagnosis Date Noted  . Edema 09/12/2014  . Exertional shortness of breath 08/20/2014  . Fatigue 08/20/2014  . Anemia 08/20/2014  . PAD (peripheral artery disease) 08/20/2014  . Pain in limb 05/01/2014  . RBBB (right bundle branch block) 08/24/2013  . Ejection fraction   . Aortic stenosis   . Mitral regurgitation   . Latex allergy   . TIA (  transient ischemic attack)   . CAD (coronary artery disease)   . Anemia due to acute blood loss 04/18/2013  . GI bleed 04/18/2013  . Mobitz type 1 second degree AV block 04/18/2013  . HTN (hypertension) 04/18/2013  . Diabetes mellitus type 2 in obese 04/18/2013      Current Outpatient Prescriptions  Medication Sig Dispense Refill  . amLODipine (NORVASC) 10 MG tablet Take 10 mg by mouth every morning.    Marland Kitchen atorvastatin (LIPITOR) 80 MG tablet Take 80 mg by mouth every evening.    . DULoxetine (CYMBALTA) 60 MG capsule Take 60 mg by mouth every evening.    . Ferrous Sulfate (IRON) 325 (65 FE) MG TABS Take 1 tablet by mouth 3 (three) times daily.     . fluticasone (FLONASE) 50 MCG/ACT  nasal spray Place 2 sprays into both nostrils as needed for allergies. prn    . furosemide (LASIX) 20 MG tablet Take 1 tablet (20 mg total) by mouth daily. 30 tablet 3  . glimepiride (AMARYL) 4 MG tablet Take 4 mg by mouth 2 (two) times daily.     Marland Kitchen HYDROcodone-acetaminophen (NORCO/VICODIN) 5-325 MG per tablet Take 1 tablet by mouth every 6 (six) hours as needed for pain. For pain    . levothyroxine (SYNTHROID, LEVOTHROID) 100 MCG tablet Take 100 mcg by mouth daily before breakfast.    . losartan (COZAAR) 100 MG tablet Take 100 mg by mouth every morning.    . metFORMIN (GLUCOPHAGE) 1000 MG tablet Take 1,000 mg by mouth 2 (two) times daily with a meal.    . pantoprazole (PROTONIX) 40 MG tablet Take 1 tablet (40 mg total) by mouth 2 (two) times daily. 60 tablet 3   No current facility-administered medications for this visit.    Allergies:   Sulfamethoxazole-trimethoprim and Latex    Social History:  The patient  reports that she quit smoking about 26 years ago. Her smoking use included Cigarettes. She started smoking about 51 years ago. She has a 16 pack-year smoking history. She has never used smokeless tobacco. She reports that she does not drink alcohol or use illicit drugs.   Family History:  The patient's family history includes Cancer in her father and mother; Colon cancer in her father; Deep vein thrombosis in her father; Diabetes in her father; Heart disease in her father and son; Varicose Veins in her son.    ROS:  Please see the history of present illness.    Patient denies fever, chills, headache, sweats, rash, change in vision, change in hearing, chest pain, cough, nausea or vomiting, urinary symptoms. All other systems are reviewed and are negative.    PHYSICAL EXAM: VS:  BP 159/77 mmHg  Pulse 75  Ht 5\' 8"  (1.727 m)  Wt 246 lb 6.4 oz (111.766 kg)  BMI 37.47 kg/m2  SpO2 96% , Patient is oriented to person time and place. Affect is normal. Head is atraumatic. Sclera and  conjunctiva are normal. There is no jugular venous distention. She has bilateral carotid sounds that may represent radiation of her aortic valve murmur. However I cannot rule out the possibility that these or bruits. Lungs are clear. Respiratory effort is not labored. Cardiac exam reveals S1 and S2. There is a 2/6 crescendo decrescendo murmur. The murmur is short in origin. The second heart sound is heard well. Abdomen is soft. There is no peripheral edema. There are no musculoskeletal form in the 80s. There are no skin rashes.   EKG:  EKG is not done today.   Recent Labs: No results found for requested labs within last 365 days.    Lipid Panel    Component Value Date/Time   CHOL 104 04/15/2013 0450   TRIG 95 04/15/2013 0450   HDL 36* 04/15/2013 0450   CHOLHDL 2.9 04/15/2013 0450   VLDL 19 04/15/2013 0450   LDLCALC 49 04/15/2013 0450      Wt Readings from Last 3 Encounters:  05/08/15 246 lb 6.4 oz (111.766 kg)  01/28/15 247 lb (112.038 kg)  12/27/14 248 lb (112.492 kg)      Current medicines are reviewed       ASSESSMENT AND PLAN:

## 2015-05-08 NOTE — Assessment & Plan Note (Signed)
The patient does have a history of GI bleed requiring transfusion in 2014. 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

## 2015-05-08 NOTE — Assessment & Plan Note (Signed)
It is possible that the sounds heard in her neck are from her aortic stenosis. However carotid Dopplers will be done to be sure she does not have significant carotid disease.

## 2015-05-08 NOTE — Assessment & Plan Note (Signed)
The patient has some intermittent 2-1 AV block. This has been assessed. She has no symptoms. This is to be followed clinically.

## 2015-05-15 ENCOUNTER — Ambulatory Visit (INDEPENDENT_AMBULATORY_CARE_PROVIDER_SITE_OTHER): Payer: Medicare Other

## 2015-05-15 DIAGNOSIS — R0989 Other specified symptoms and signs involving the circulatory and respiratory systems: Secondary | ICD-10-CM

## 2015-05-22 ENCOUNTER — Encounter: Payer: Self-pay | Admitting: Cardiology

## 2015-05-22 ENCOUNTER — Telehealth: Payer: Self-pay | Admitting: *Deleted

## 2015-05-22 DIAGNOSIS — E041 Nontoxic single thyroid nodule: Secondary | ICD-10-CM | POA: Insufficient documentation

## 2015-05-22 NOTE — Telephone Encounter (Signed)
Patient informed and verbalized understanding of plan and to make sure this is follow up on by Dr. Edrick Oh.

## 2015-05-22 NOTE — Telephone Encounter (Signed)
-----   Message from Carlena Bjornstad, MD sent at 05/22/2015  9:00 AM EDT -----  Please notify the patient that I have reviewed the Doppler result. 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 I am not concerned about this. This can be followed with a follow-up study in 2 years. There is a nodule seen in her right thyroid. Please let her know that we will contact Dr. Murrell Redden office with this data so that he can arrange follow-up. He may want to arrange for a carotid ultrasound.    ----- Message -----    From: Wellington Hampshire, MD    Sent: 05/15/2015   7:15 PM      To: Carlena Bjornstad, MD

## 2015-05-22 NOTE — Telephone Encounter (Signed)
Nurse for Dr. Murrell Redden office informed and verbalized understanding to follow up with patient about this finding. Copy sent to [place]'s office.

## 2015-06-15 ENCOUNTER — Other Ambulatory Visit: Payer: Self-pay | Admitting: Cardiology

## 2015-08-11 ENCOUNTER — Inpatient Hospital Stay (HOSPITAL_COMMUNITY): Payer: Medicare Other

## 2015-08-11 ENCOUNTER — Inpatient Hospital Stay (HOSPITAL_COMMUNITY)
Admission: AD | Admit: 2015-08-11 | Discharge: 2015-09-08 | DRG: 870 | Disposition: E | Payer: Medicare Other | Source: Other Acute Inpatient Hospital | Attending: Pulmonary Disease | Admitting: Pulmonary Disease

## 2015-08-11 ENCOUNTER — Encounter (HOSPITAL_COMMUNITY): Payer: Self-pay | Admitting: Pulmonary Disease

## 2015-08-11 DIAGNOSIS — E1121 Type 2 diabetes mellitus with diabetic nephropathy: Secondary | ICD-10-CM | POA: Diagnosis present

## 2015-08-11 DIAGNOSIS — R740 Nonspecific elevation of levels of transaminase and lactic acid dehydrogenase [LDH]: Secondary | ICD-10-CM | POA: Diagnosis not present

## 2015-08-11 DIAGNOSIS — G9340 Encephalopathy, unspecified: Secondary | ICD-10-CM | POA: Diagnosis not present

## 2015-08-11 DIAGNOSIS — I6340 Cerebral infarction due to embolism of unspecified cerebral artery: Secondary | ICD-10-CM | POA: Diagnosis present

## 2015-08-11 DIAGNOSIS — I4901 Ventricular fibrillation: Secondary | ICD-10-CM | POA: Diagnosis present

## 2015-08-11 DIAGNOSIS — A419 Sepsis, unspecified organism: Secondary | ICD-10-CM | POA: Diagnosis not present

## 2015-08-11 DIAGNOSIS — E1165 Type 2 diabetes mellitus with hyperglycemia: Secondary | ICD-10-CM | POA: Diagnosis not present

## 2015-08-11 DIAGNOSIS — E1169 Type 2 diabetes mellitus with other specified complication: Secondary | ICD-10-CM | POA: Diagnosis present

## 2015-08-11 DIAGNOSIS — E039 Hypothyroidism, unspecified: Secondary | ICD-10-CM | POA: Diagnosis present

## 2015-08-11 DIAGNOSIS — E1142 Type 2 diabetes mellitus with diabetic polyneuropathy: Secondary | ICD-10-CM | POA: Diagnosis present

## 2015-08-11 DIAGNOSIS — Z6841 Body Mass Index (BMI) 40.0 and over, adult: Secondary | ICD-10-CM

## 2015-08-11 DIAGNOSIS — D62 Acute posthemorrhagic anemia: Secondary | ICD-10-CM | POA: Diagnosis not present

## 2015-08-11 DIAGNOSIS — Z888 Allergy status to other drugs, medicaments and biological substances status: Secondary | ICD-10-CM

## 2015-08-11 DIAGNOSIS — B3749 Other urogenital candidiasis: Secondary | ICD-10-CM | POA: Diagnosis present

## 2015-08-11 DIAGNOSIS — I469 Cardiac arrest, cause unspecified: Secondary | ICD-10-CM | POA: Diagnosis not present

## 2015-08-11 DIAGNOSIS — Z9289 Personal history of other medical treatment: Secondary | ICD-10-CM

## 2015-08-11 DIAGNOSIS — G0490 Encephalitis and encephalomyelitis, unspecified: Secondary | ICD-10-CM | POA: Diagnosis not present

## 2015-08-11 DIAGNOSIS — J9621 Acute and chronic respiratory failure with hypoxia: Secondary | ICD-10-CM | POA: Diagnosis not present

## 2015-08-11 DIAGNOSIS — G931 Anoxic brain damage, not elsewhere classified: Secondary | ICD-10-CM | POA: Diagnosis present

## 2015-08-11 DIAGNOSIS — I509 Heart failure, unspecified: Secondary | ICD-10-CM

## 2015-08-11 DIAGNOSIS — Z8673 Personal history of transient ischemic attack (TIA), and cerebral infarction without residual deficits: Secondary | ICD-10-CM

## 2015-08-11 DIAGNOSIS — Z91040 Latex allergy status: Secondary | ICD-10-CM

## 2015-08-11 DIAGNOSIS — I639 Cerebral infarction, unspecified: Secondary | ICD-10-CM | POA: Diagnosis not present

## 2015-08-11 DIAGNOSIS — E872 Acidosis: Secondary | ICD-10-CM | POA: Diagnosis present

## 2015-08-11 DIAGNOSIS — Z79899 Other long term (current) drug therapy: Secondary | ICD-10-CM

## 2015-08-11 DIAGNOSIS — R34 Anuria and oliguria: Secondary | ICD-10-CM | POA: Diagnosis present

## 2015-08-11 DIAGNOSIS — E876 Hypokalemia: Secondary | ICD-10-CM | POA: Diagnosis not present

## 2015-08-11 DIAGNOSIS — I480 Paroxysmal atrial fibrillation: Secondary | ICD-10-CM | POA: Insufficient documentation

## 2015-08-11 DIAGNOSIS — Z0189 Encounter for other specified special examinations: Secondary | ICD-10-CM

## 2015-08-11 DIAGNOSIS — I6330 Cerebral infarction due to thrombosis of unspecified cerebral artery: Secondary | ICD-10-CM | POA: Insufficient documentation

## 2015-08-11 DIAGNOSIS — I248 Other forms of acute ischemic heart disease: Secondary | ICD-10-CM | POA: Diagnosis present

## 2015-08-11 DIAGNOSIS — I2510 Atherosclerotic heart disease of native coronary artery without angina pectoris: Secondary | ICD-10-CM | POA: Diagnosis present

## 2015-08-11 DIAGNOSIS — I340 Nonrheumatic mitral (valve) insufficiency: Secondary | ICD-10-CM | POA: Diagnosis present

## 2015-08-11 DIAGNOSIS — Z89021 Acquired absence of right finger(s): Secondary | ICD-10-CM | POA: Diagnosis not present

## 2015-08-11 DIAGNOSIS — Z452 Encounter for adjustment and management of vascular access device: Secondary | ICD-10-CM

## 2015-08-11 DIAGNOSIS — G9349 Other encephalopathy: Secondary | ICD-10-CM | POA: Diagnosis present

## 2015-08-11 DIAGNOSIS — I4891 Unspecified atrial fibrillation: Secondary | ICD-10-CM | POA: Diagnosis not present

## 2015-08-11 DIAGNOSIS — Z91048 Other nonmedicinal substance allergy status: Secondary | ICD-10-CM

## 2015-08-11 DIAGNOSIS — J189 Pneumonia, unspecified organism: Secondary | ICD-10-CM | POA: Diagnosis present

## 2015-08-11 DIAGNOSIS — I350 Nonrheumatic aortic (valve) stenosis: Secondary | ICD-10-CM | POA: Diagnosis present

## 2015-08-11 DIAGNOSIS — Z9189 Other specified personal risk factors, not elsewhere classified: Secondary | ICD-10-CM

## 2015-08-11 DIAGNOSIS — G039 Meningitis, unspecified: Secondary | ICD-10-CM

## 2015-08-11 DIAGNOSIS — G40901 Epilepsy, unspecified, not intractable, with status epilepticus: Secondary | ICD-10-CM | POA: Diagnosis present

## 2015-08-11 DIAGNOSIS — K219 Gastro-esophageal reflux disease without esophagitis: Secondary | ICD-10-CM | POA: Diagnosis present

## 2015-08-11 DIAGNOSIS — Z7984 Long term (current) use of oral hypoglycemic drugs: Secondary | ICD-10-CM | POA: Diagnosis not present

## 2015-08-11 DIAGNOSIS — E785 Hyperlipidemia, unspecified: Secondary | ICD-10-CM | POA: Diagnosis present

## 2015-08-11 DIAGNOSIS — I10 Essential (primary) hypertension: Secondary | ICD-10-CM | POA: Diagnosis present

## 2015-08-11 DIAGNOSIS — I5032 Chronic diastolic (congestive) heart failure: Secondary | ICD-10-CM | POA: Diagnosis present

## 2015-08-11 DIAGNOSIS — N179 Acute kidney failure, unspecified: Secondary | ICD-10-CM | POA: Diagnosis not present

## 2015-08-11 DIAGNOSIS — N19 Unspecified kidney failure: Secondary | ICD-10-CM

## 2015-08-11 DIAGNOSIS — J9690 Respiratory failure, unspecified, unspecified whether with hypoxia or hypercapnia: Secondary | ICD-10-CM | POA: Insufficient documentation

## 2015-08-11 DIAGNOSIS — Z89411 Acquired absence of right great toe: Secondary | ICD-10-CM | POA: Diagnosis not present

## 2015-08-11 DIAGNOSIS — R6521 Severe sepsis with septic shock: Secondary | ICD-10-CM | POA: Diagnosis present

## 2015-08-11 DIAGNOSIS — J9601 Acute respiratory failure with hypoxia: Secondary | ICD-10-CM | POA: Diagnosis present

## 2015-08-11 DIAGNOSIS — N170 Acute kidney failure with tubular necrosis: Secondary | ICD-10-CM | POA: Diagnosis present

## 2015-08-11 DIAGNOSIS — I6349 Cerebral infarction due to embolism of other cerebral artery: Secondary | ICD-10-CM | POA: Diagnosis not present

## 2015-08-11 DIAGNOSIS — R51 Headache: Secondary | ICD-10-CM

## 2015-08-11 DIAGNOSIS — R569 Unspecified convulsions: Secondary | ICD-10-CM

## 2015-08-11 DIAGNOSIS — I462 Cardiac arrest due to underlying cardiac condition: Secondary | ICD-10-CM | POA: Diagnosis present

## 2015-08-11 DIAGNOSIS — D6489 Other specified anemias: Secondary | ICD-10-CM | POA: Diagnosis present

## 2015-08-11 DIAGNOSIS — R7401 Elevation of levels of liver transaminase levels: Secondary | ICD-10-CM

## 2015-08-11 DIAGNOSIS — M86671 Other chronic osteomyelitis, right ankle and foot: Secondary | ICD-10-CM | POA: Diagnosis present

## 2015-08-11 DIAGNOSIS — G9341 Metabolic encephalopathy: Secondary | ICD-10-CM | POA: Diagnosis not present

## 2015-08-11 DIAGNOSIS — J810 Acute pulmonary edema: Secondary | ICD-10-CM | POA: Diagnosis not present

## 2015-08-11 DIAGNOSIS — M25512 Pain in left shoulder: Secondary | ICD-10-CM

## 2015-08-11 DIAGNOSIS — Z66 Do not resuscitate: Secondary | ICD-10-CM | POA: Diagnosis present

## 2015-08-11 DIAGNOSIS — Z87891 Personal history of nicotine dependence: Secondary | ICD-10-CM

## 2015-08-11 DIAGNOSIS — Z515 Encounter for palliative care: Secondary | ICD-10-CM | POA: Diagnosis not present

## 2015-08-11 HISTORY — DX: Osteomyelitis, unspecified: M86.9

## 2015-08-11 LAB — COMPREHENSIVE METABOLIC PANEL
ALT: 78 U/L — ABNORMAL HIGH (ref 14–54)
ANION GAP: 13 (ref 5–15)
AST: 51 U/L — ABNORMAL HIGH (ref 15–41)
Albumin: 2.7 g/dL — ABNORMAL LOW (ref 3.5–5.0)
Alkaline Phosphatase: 60 U/L (ref 38–126)
BILIRUBIN TOTAL: 0.5 mg/dL (ref 0.3–1.2)
BUN: 45 mg/dL — ABNORMAL HIGH (ref 6–20)
CHLORIDE: 109 mmol/L (ref 101–111)
CO2: 20 mmol/L — ABNORMAL LOW (ref 22–32)
Calcium: 7.3 mg/dL — ABNORMAL LOW (ref 8.9–10.3)
Creatinine, Ser: 3.91 mg/dL — ABNORMAL HIGH (ref 0.44–1.00)
GFR calc Af Amer: 12 mL/min — ABNORMAL LOW (ref >60)
GFR, EST NON AFRICAN AMERICAN: 11 mL/min — AB (ref >60)
Glucose, Bld: 187 mg/dL — ABNORMAL HIGH (ref 65–99)
POTASSIUM: 4.3 mmol/L (ref 3.5–5.1)
Sodium: 142 mmol/L (ref 135–145)
TOTAL PROTEIN: 6.2 g/dL — AB (ref 6.5–8.1)

## 2015-08-11 LAB — TYPE AND SCREEN
ABO/RH(D): AB POS
ANTIBODY SCREEN: NEGATIVE

## 2015-08-11 LAB — LIPASE, BLOOD: LIPASE: 36 U/L (ref 11–51)

## 2015-08-11 LAB — PHENYTOIN LEVEL, TOTAL: Phenytoin Lvl: 4.7 ug/mL — ABNORMAL LOW (ref 10.0–20.0)

## 2015-08-11 LAB — PROCALCITONIN: Procalcitonin: 19.06 ng/mL

## 2015-08-11 LAB — AMYLASE: AMYLASE: 54 U/L (ref 28–100)

## 2015-08-11 LAB — LACTIC ACID, PLASMA
Lactic Acid, Venous: 1.1 mmol/L (ref 0.5–2.0)
Lactic Acid, Venous: 1.3 mmol/L (ref 0.5–2.0)

## 2015-08-11 LAB — PHOSPHORUS: PHOSPHORUS: 7.4 mg/dL — AB (ref 2.5–4.6)

## 2015-08-11 LAB — GLUCOSE, CAPILLARY: GLUCOSE-CAPILLARY: 149 mg/dL — AB (ref 65–99)

## 2015-08-11 LAB — PROTIME-INR
INR: 1.32 (ref 0.00–1.49)
PROTHROMBIN TIME: 16.5 s — AB (ref 11.6–15.2)

## 2015-08-11 LAB — APTT: aPTT: 28 seconds (ref 24–37)

## 2015-08-11 LAB — CORTISOL: Cortisol, Plasma: 64.8 ug/dL

## 2015-08-11 LAB — VANCOMYCIN, RANDOM: Vancomycin Rm: 13 ug/mL

## 2015-08-11 LAB — MRSA PCR SCREENING: MRSA BY PCR: NEGATIVE

## 2015-08-11 LAB — TROPONIN I: TROPONIN I: 4.62 ng/mL — AB (ref <0.031)

## 2015-08-11 LAB — BRAIN NATRIURETIC PEPTIDE: B Natriuretic Peptide: 1084.1 pg/mL — ABNORMAL HIGH (ref 0.0–100.0)

## 2015-08-11 LAB — MAGNESIUM: MAGNESIUM: 1.5 mg/dL — AB (ref 1.7–2.4)

## 2015-08-11 MED ORDER — ONDANSETRON HCL 4 MG/2ML IJ SOLN: 4.0000 mg | Freq: Four times a day (QID) | INTRAMUSCULAR | Status: DC | PRN

## 2015-08-11 MED ORDER — ANTISEPTIC ORAL RINSE SOLUTION (CORINZ)
7.0000 mL | OROMUCOSAL | Status: DC
  Administered 2015-08-11 – 2015-08-21 (×95): 7 mL via OROMUCOSAL

## 2015-08-11 MED ORDER — LORAZEPAM 2 MG/ML IJ SOLN
1.0000 mg | INTRAMUSCULAR | Status: DC | PRN
  Filled 2015-08-11: qty 1

## 2015-08-11 MED ORDER — SODIUM CHLORIDE 0.9 % IV SOLN: 2.0000 g | Freq: Two times a day (BID) | INTRAVENOUS | Status: DC

## 2015-08-11 MED ORDER — SODIUM CHLORIDE 0.9 % IV SOLN
1500.0000 mg | INTRAVENOUS | Status: DC
  Administered 2015-08-11 – 2015-08-13 (×2): 1500 mg via INTRAVENOUS
  Filled 2015-08-11 (×3): qty 1500

## 2015-08-11 MED ORDER — SODIUM CHLORIDE 0.9 % IV SOLN
2.0000 g | Freq: Two times a day (BID) | INTRAVENOUS | Status: DC
  Administered 2015-08-12 – 2015-08-14 (×5): 2 g via INTRAVENOUS
  Filled 2015-08-11 (×6): qty 2000

## 2015-08-11 MED ORDER — HEPARIN SODIUM (PORCINE) 5000 UNIT/ML IJ SOLN
5000.0000 [IU] | Freq: Three times a day (TID) | INTRAMUSCULAR | Status: DC
  Administered 2015-08-11 – 2015-08-12 (×3): 5000 [IU] via SUBCUTANEOUS
  Filled 2015-08-11 (×3): qty 1

## 2015-08-11 MED ORDER — DEXTROSE 5 % IV SOLN
2.0000 g | Freq: Once | INTRAVENOUS | Status: AC
  Administered 2015-08-11: 2 g via INTRAVENOUS
  Filled 2015-08-11: qty 2

## 2015-08-11 MED ORDER — INSULIN ASPART 100 UNIT/ML ~~LOC~~ SOLN
0.0000 [IU] | SUBCUTANEOUS | Status: DC
  Administered 2015-08-11 – 2015-08-12 (×2): 1 [IU] via SUBCUTANEOUS
  Administered 2015-08-12 (×2): 2 [IU] via SUBCUTANEOUS
  Administered 2015-08-12 (×2): 1 [IU] via SUBCUTANEOUS
  Administered 2015-08-13 (×2): 2 [IU] via SUBCUTANEOUS
  Administered 2015-08-13: 3 [IU] via SUBCUTANEOUS
  Administered 2015-08-13 – 2015-08-14 (×4): 2 [IU] via SUBCUTANEOUS
  Administered 2015-08-14: 3 [IU] via SUBCUTANEOUS
  Administered 2015-08-14: 1 [IU] via SUBCUTANEOUS
  Administered 2015-08-14 (×2): 3 [IU] via SUBCUTANEOUS
  Administered 2015-08-14: 2 [IU] via SUBCUTANEOUS
  Administered 2015-08-14 – 2015-08-15 (×3): 5 [IU] via SUBCUTANEOUS
  Administered 2015-08-15: 3 [IU] via SUBCUTANEOUS
  Administered 2015-08-15 (×3): 5 [IU] via SUBCUTANEOUS
  Administered 2015-08-16: 3 [IU] via SUBCUTANEOUS
  Administered 2015-08-16 (×2): 5 [IU] via SUBCUTANEOUS
  Administered 2015-08-16: 7 [IU] via SUBCUTANEOUS
  Administered 2015-08-16: 3 [IU] via SUBCUTANEOUS
  Administered 2015-08-17 (×2): 5 [IU] via SUBCUTANEOUS

## 2015-08-11 MED ORDER — SODIUM CHLORIDE 0.9 % IV SOLN: 250.0000 mL | INTRAVENOUS | Status: DC | PRN

## 2015-08-11 MED ORDER — PROPOFOL 1000 MG/100ML IV EMUL
5.0000 ug/kg/min | INTRAVENOUS | Status: DC
  Administered 2015-08-11: 10 ug/kg/min via INTRAVENOUS
  Administered 2015-08-12 (×3): 20 ug/kg/min via INTRAVENOUS
  Administered 2015-08-13: 15 ug/kg/min via INTRAVENOUS
  Filled 2015-08-11 (×5): qty 100

## 2015-08-11 MED ORDER — CHLORHEXIDINE GLUCONATE 0.12% ORAL RINSE (MEDLINE KIT)
15.0000 mL | Freq: Two times a day (BID) | OROMUCOSAL | Status: DC
  Administered 2015-08-11 – 2015-08-21 (×20): 15 mL via OROMUCOSAL

## 2015-08-11 MED ORDER — DEXTROSE 5 % IV SOLN
2.0000 g | INTRAVENOUS | Status: DC
  Administered 2015-08-12 – 2015-08-14 (×3): 2 g via INTRAVENOUS
  Filled 2015-08-11 (×5): qty 2

## 2015-08-11 MED ORDER — PANTOPRAZOLE SODIUM 40 MG IV SOLR
40.0000 mg | Freq: Every day | INTRAVENOUS | Status: DC
Start: 2015-08-11 — End: 2015-08-12
  Administered 2015-08-11: 40 mg via INTRAVENOUS
  Filled 2015-08-11: qty 40

## 2015-08-11 MED ORDER — SODIUM CHLORIDE 0.9 % IV SOLN
2.0000 g | Freq: Once | INTRAVENOUS | Status: AC
  Administered 2015-08-11: 2 g via INTRAVENOUS
  Filled 2015-08-11: qty 2000

## 2015-08-11 MED ORDER — FENTANYL CITRATE (PF) 100 MCG/2ML IJ SOLN: 25.0000 ug | INTRAMUSCULAR | Status: DC | PRN

## 2015-08-11 NOTE — Progress Notes (Signed)
Radiologist called regarding CT head impression. E-link notified of CT head/spine results. 12 Lead EKG completed. E-link notified of results. No new orders given.

## 2015-08-11 NOTE — H&P (Signed)
PULMONARY / CRITICAL CARE MEDICINE   Name: Melissa Becker MRN: [id number] DOB: 18-Nov-1945    ADMISSION DATE:  08/13/2015 CONSULTATION DATE:  08/18/2015  REFERRING MD:  Anselmo Pickler, D.O. [facility])  CHIEF COMPLAINT:  Sepsis  HISTORY OF PRESENT ILLNESS:   69 year old Caucasian female with known history of diabetes mellitus as well as coronary artery disease. Presented to outside hospital on 12/3 with a two-week history of a "headache" per her husband today. Patient is currently intubated and history obtained from the patient's family as well as the electronic medical record. Patient was taking Advil for her chronic and severe headache without relief. At approximately 1 AM on 12/3 she had an unwitnessed fall in their bathroom and was found conscious by her husband. He reports she did have a rightward gaze at the time with questionable left-sided weakness. EMS assisted her in returning to her bed. The patient attempted to get up again with the assistance of her husband and fell due to weakness. At that time she was transported outside hospital. The patient's husband reports she's had no sick contacts or consumed any raw food. At outside hospital where she underwent CT imaging of the brain and while returning to the emergency department had an apparent seizure followed by a second seizure and ultimately went into ventricular fibrillation with cardiac arrest requiring defibrillation 3 times. At that time patient was on dopamine for shock and was given 1 dose of vancomycin as well as Zosyn. The patient was ultimately intubated at outside hospital. Overnight the patient was weaned off of vasopressor infusion but had questionable episodes of seizure-like activity. Patient was given an IV load of Dilantin followed by a Dilantin drip. The patient was noted to progress to oliguric renal failure just prior to transfer. Patient did reportedly have a fever to 102F at outside hospital as well.  PAST  MEDICAL HISTORY :  Past Medical History  Diagnosis Date  . Hypothyroid   . Type 2 diabetes mellitus ([place])   . Hypertension   . TIA (transient ischemic attack)     history of TIAs  . Neuropathy ([place])   . Ejection fraction   . Aortic stenosis   . Mitral regurgitation   . Latex allergy     Itching  . GI bleed 2014  . CAD (coronary artery disease)   . GERD (gastroesophageal reflux disease)   . Anemia   . Allergy   . Atrial fibrillation ([place])   . Stroke [facility]) 6 yrs  ago    TIA  . Pneumonia 10-15 years ago  . Osteomyelitis ([place])     Right toe    PAST SURGICAL HISTORY: Past Surgical History  Procedure Laterality Date  . Tonsillectomy    . Cholecystectomy    . Ventral hernia repair    . Esophagogastroduodenoscopy N/A 04/15/2013    Procedure: ESOPHAGOGASTRODUODENOSCOPY (EGD);  Surgeon: Wonda Horner, MD;  Location: [facility] ENDOSCOPY;  Service: Endoscopy;  Laterality: N/A;  . Givens capsule study N/A 04/18/2013    Procedure: GIVENS CAPSULE STUDY;  Surgeon: Wonda Horner, MD;  Location: [facility] ENDOSCOPY;  Service: Endoscopy;  Laterality: N/A;  . Tonsillectomy    . Hernia repair    . Right finger surgery  1982    amputation of right ring finger  . Right toe surgery      right toe surgery-next to big toe amputated  . Colonoscopy with propofol N/A 12/22/2013    Procedure: COLONOSCOPY WITH PROPOFOL;  Surgeon: Winfield Cunas., MD;  Location: [facility] ENDOSCOPY;  Service: Endoscopy;  Laterality: N/A;  . Left heart catheterization with coronary angiogram N/A 04/17/2013    Procedure: LEFT HEART CATHETERIZATION WITH CORONARY ANGIOGRAM;  Surgeon: Peter M Martinique, MD;  Location: [facility] CATH LAB;  Service: Cardiovascular;  Laterality: N/A;  . Abdominal hysterectomy      partial  . Esophagogastroduodenoscopy (egd) with propofol N/A 10/18/2014    Procedure: ESOPHAGOGASTRODUODENOSCOPY (EGD) WITH PROPOFOL;  Surgeon: Winfield Cunas., MD;  Location: [facility] ENDOSCOPY;  Service: Endoscopy;  Laterality: N/A;  . Hot  hemostasis N/A 10/18/2014    Procedure: HOT HEMOSTASIS (ARGON PLASMA COAGULATION/BICAP);  Surgeon: Winfield Cunas., MD;  Location: [facility] ENDOSCOPY;  Service: Endoscopy;  Laterality: N/A;     Allergies  Allergen Reactions  . Sulfamethoxazole-Trimethoprim Other (See Comments)    Headaches  . Latex Itching    whelps  . Tape     No current facility-administered medications on file prior to encounter.   Current Outpatient Prescriptions on File Prior to Encounter  Medication Sig  . amLODipine (NORVASC) 10 MG tablet Take 10 mg by mouth every morning.  Marland Kitchen atorvastatin (LIPITOR) 80 MG tablet Take 80 mg by mouth every evening.  . DULoxetine (CYMBALTA) 60 MG capsule Take 60 mg by mouth every evening.  . Ferrous Sulfate (IRON) 325 (65 FE) MG TABS Take 1 tablet by mouth 3 (three) times daily.   . fluticasone (FLONASE) 50 MCG/ACT nasal spray Place 2 sprays into both nostrils as needed for allergies. prn  . furosemide (LASIX) 20 MG tablet TAKE 1 TABLET (20 MG TOTAL) BY MOUTH DAILY.  Marland Kitchen glimepiride (AMARYL) 4 MG tablet Take 4 mg by mouth 2 (two) times daily.   Marland Kitchen HYDROcodone-acetaminophen (NORCO/VICODIN) 5-325 MG per tablet Take 1 tablet by mouth every 6 (six) hours as needed for pain. For pain  . levothyroxine (SYNTHROID, LEVOTHROID) 100 MCG tablet Take 100 mcg by mouth daily before breakfast.  . losartan (COZAAR) 100 MG tablet Take 100 mg by mouth every morning.  . metFORMIN (GLUCOPHAGE) 1000 MG tablet Take 1,000 mg by mouth 2 (two) times daily with a meal.  . pantoprazole (PROTONIX) 40 MG tablet Take 1 tablet (40 mg total) by mouth 2 (two) times daily.    FAMILY HISTORY:  No family history available in the electronic medical record.  SOCIAL HISTORY: Social History  Substance Use Topics  . Smoking status: Former Smoker -- 2.00 packs/day for 8 years    Types: Cigarettes    Start date: 07/12/1963    Quit date: 10/27/1992  . Smokeless tobacco: Never Used  . Alcohol Use: No    REVIEW OF  SYSTEMS:  Unable to obtain secondary to intubation and sedation.  SUBJECTIVE:   VITAL SIGNS: BP 144/52 mmHg  Pulse 48  Temp(Src) 98.6 F (37 C) (Axillary)  Resp 19  Ht 5\' 6"  (1.676 m)  Wt 250 lb 3.6 oz (113.5 kg)  BMI 40.41 kg/m2  HEMODYNAMICS:    VENTILATOR SETTINGS: Vent Mode:  [-] PRVC FiO2 (%):  [40 %] 40 % Set Rate:  [16 bmp] 16 bmp Vt Set:  [550 mL] 550 mL PEEP:  [5 cmH20] 5 cmH20 Plateau Pressure:  [15 cmH20] 15 cmH20  INTAKE / OUTPUT:    PHYSICAL EXAMINATION: General:  Sedate. No acute distress. Family at bedside. Obese female. Integument:  Warm & dry. No rash on exposed skin. No bruising. Mild erythema over right 3rd toe. Lymphatics:  No appreciated cervical or supraclavicular lymphadenoapthy. HEENT:  No scleral injection or  icterus. Endotracheal tube in place. PERRL. Cardiovascular:  Regular rate. No edema. No appreciable JVD.  Pulmonary:  Good aeration & clear to auscultation bilaterally. Symmetric chest wall rise on ventilator. Abdomen: Soft. Normal bowel sounds. Nondistended.  Musculoskeletal:  Right 1st & 2nd toe amputations. Finger amputation noted. No joint effusion appreciated. Neurological:  Pupils reactive to light. Withdrawal to pain in bilateral upper extremities but not lowers. Sedated. Psychiatric:  Unable to assess due to sedation.   OSH LABS: 08/29/2015 ABG 7.34 / 40 / 162 / Sat 100% CBC: 18.6/9.5/31.4/219 BMP: 140/4.0/103/20/37/2.56/179/7.4 LFT: 3.0/6.2/0.3/72/65/82 Amylase: 34 Lipase: 60 Magnesium: 1.3 CK: 36 CK-MB: 1.8 TSH: 0.99 Troponin I: 0.03 BMP: 111 ProBNP: 34,134  08/10/15 ABG:  7.12/36/377/Sat 99% Hemoglobin A1c: 6.8 U/A: The 1.025/6.0/protein >300/Glucose >1000/Ketone 40/WBC 5-10/RBC 0-5/Bacteria large/yeast moderate BMP: 137/4.0/X/12/17/0.75 Alkaline phosphatase: 93 Troponin I: 0.02 Lactic acid: 2.0  STUDIES:  TTE 04/15/13:  LV normal in size. EF 55-60%. Grade 1 diastolic dysfunction. Mild aortic stenosis. Mild mitral  regurg. RV mildly dilated. CT HEAD/C-SPINE 12/3 (OSH):  Limited by motion artifact. Generalized atrophy. Normal gray-white matter. No mass hemorrhage, or edema. Sinuses are clear. Soft tissues unremarkable. No obvious fracture or dislocation. Port CXR 12/4 (personally reviewed by me):  ETT in good position. Right mid lung opacity. OGT goes below diaphragm.  CULTURES: Blood 12/3 OSH>>> Urine 12/3 OSH>>>  ANTIBIOTICS: Vancomcyin 12/3>>> Zosyn 12/3>>>  SIGNIFICANT EVENTS: 12/3 - Admit to OSH w/ Seizure & Fall 12/4 - Transfer to [facility]  LINES/TUBES: PIV Foley 12/3 OSH>>> OETT 7.5 12/3 OSH>>>  ASSESSMENT / PLAN:  PULMONARY A: Acute Hypoxic Respiratory Failure Aspiration Pneumonia vs. Pneumonitis  P:   Continuing full vent support with 6cc/kg IBW ABG now Port CXR now Wean FiO2 for Sat >94% Daily SBT as mental status & clinical stability allows  CARDIOVASCULAR A:  V Fib Cardiac Arrest - S/P Shock x3 & Epi Septic vs Cardiogenic Shock - Weaned off Dopamine at OSH. H/O Atrial Fibrillation - Not on anticoagulation. H/O HTN  P:  Monitor on Telemetry Checking TTE Checking EKG now Holding on further Amiodarone for now  RENAL A:   Acute Renal Failure - Baseline Creatinine 0.7-0.8 04/2013. Hypomagnesemia Metabolic Acidosis  P:   Continuing Foley Catheter Trending UOP Repeat CMP, Magnesium & Phosphorus Abdominal U/S for possible hydro Repeat Lactic Acid q3hr  GASTROINTESTINAL A:   Transaminitis Possible Pancreatitis  P:   Checking LFTs now NPO Protonix IV daily Abodminal U/S Lipase & Amylase  HEMATOLOGIC A:   Anemia - Prior Hgb 8.6-8.9 in August 2014. 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 UTI - Yeast on U/A.  P:   Treated with Zosyn & Vancomycin at OSH. Vancomycin & Tressie Ellis per pharmacy dosing Ampicillin 2gm IV q12hr  for possible Meningitis Repeat Blood, Urine, & Tracheal Aspirate Cultures Urine Streptococcal & Legionella Antigens Respiratory Viral Panel PCR Consider LP if CT Head Normal along with Coags Droplet Isolation Procalcitonin Algorithm  ENDOCRINE A:   H/O DM - Hgb A1c 6.8 on admission. H/O Hypothryoidism - TSH normal on admission.  P:   Accu-Checks q4hr Low dose SSI per Algorithm Checking serum Cortisol Holding home Synthroid  NEUROLOGIC A:   New Onset Seizure - Started on Dilantin at OSH. 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  P:   RASS goal: 0 to -1 Stat CT Head &  C-Spine W/O EEG Stat Neurology Consult Checking Dilantin & Free Dilantin Level Propofol gtt Fentanyl IV prn Seizure Precautions Ativan q22min IV prn Seizure   FAMILY  - Updates: Husband and other family members at [place] family meet or Palliative Care meeting due by:  12/11  TODAY'S SUMMARY:  69 year old female admitted to outside hospital with recurrent fall and questionable CVA symptoms. Patient did have new onset seizure activity as well as ventricular fibrillation cardiac arrest requiring defibrillation. Patient subsequently weaned off of vasopressor support. Continuing on broad-spectrum antibiotic coverage for possible meningoencephalitis while awaiting further serum & imaging studies. Plan for lumbar puncture as results return. Family updated at bedside regarding critical status. Full code per my discussion with her husband at bedside.  I have spent a total of 60 minutes of critical care time updating family at bedside, caring for the patient, and reviewing outside records and imaging studies.  Sonia Baller Ashok Cordia, M.D. [facility] Pager:  [phone] After 3pm or if no response, call [phone]  08/12/2015, 7:55 PM

## 2015-08-11 NOTE — Progress Notes (Signed)
Pt transported to/from CT, then to/from ultrasound.

## 2015-08-11 NOTE — Progress Notes (Signed)
ANTIBIOTIC CONSULT NOTE - INITIAL  Pharmacy Consult for Vancomycin, Ceftazidime and Ampicillin Indication: coverage for meningitis, sepsis, pneumonia and osteo  Allergies  Allergen Reactions  . Sulfamethoxazole-Trimethoprim Other (See Comments)    Headaches  . Latex Itching    whelps  . Tape     Patient Measurements: Height: 5\' 6"  (167.6 cm) Weight: 250 lb 3.6 oz (113.5 kg) IBW/kg (Calculated) : 59.3  Vital Signs: Temp: 98.6 F (37 C) (12/04 1947) Temp Source: Axillary (12/04 1947) BP: 124/65 mmHg (12/04 2200) Pulse Rate: 82 (12/04 2200)   Intake/Output from this shift: Total I/O In: 114.5 [I.V.:14.5; IV Piggyback:100] Out: 7 [Urine:7]  Labs:  Recent Labs  09/03/2015 2010  CREATININE 3.91*   Estimated Creatinine Clearance: 17.1 mL/min (by C-G formula based on Cr of 3.91).  Recent Labs  08/31/2015 2010  VANCORANDOM 50     Microbiology: Recent Results (from the past 720 hour(s))  MRSA PCR Screening     Status: None   Collection Time: 08/21/2015  6:20 PM  Result Value Ref Range Status   MRSA by PCR NEGATIVE NEGATIVE Final    Comment:        The GeneXpert MRSA Assay (FDA approved for NASAL specimens only), is one component of a comprehensive MRSA colonization surveillance program. It is not intended to diagnose MRSA infection nor to guide or monitor treatment for MRSA infections.     Medical History: Past Medical History  Diagnosis Date  . Hypothyroid   . Type 2 diabetes mellitus ([place])   . Hypertension   . TIA (transient ischemic attack)     history of TIAs  . Neuropathy ([place])   . Ejection fraction   . Aortic stenosis   . Mitral regurgitation   . Latex allergy     Itching  . GI bleed 2014  . CAD (coronary artery disease)   . GERD (gastroesophageal reflux disease)   . Anemia   . Allergy   . Atrial fibrillation ([place])   . Stroke [facility]) 6 yrs  ago    TIA  . Pneumonia 10-15 years ago  . Osteomyelitis ([facility])     Right toe   Assessment:   69 yr  old female transferred to [facility] from outside hospital.  Prior pharmacist had spoken with Dr. Ashok Cordia. ARF. Had been on Vanc and Zosyn; now to continue Vanc and change Zosyn to [place] and Ampicillin for broad coverage for meningitis, sepsis, pneumonia and osteo.  Vanc had been given at prior hospital on 12/3. Random Vanc level = 13 mcg/ml, so OK for re-dosing.  One-time doses of Ampicillin 2gm IV and Fortaz 2gm IV given while labs were in process.   Creatinine 3.91, crcl ~15-20 ml/min. Mimimal UOP.  Goal of Therapy:  Vancomycin trough level 15-20 mcg/ml appropriate Ceftazidime and Ampicillin doses for renal function and infection  Plan:   Vancomycin 1500 mg IV q48hrs.  Ceftazidime 2 grams IV q24hrs.  Ampicillin 2gm IV q12hrs.  Will follow up renal function for any reason to modify regimens.  Follow culture data, clinical status.  Arty Baumgartner, [place] Pager: [phone] 08/08/2015,10:28 PM

## 2015-08-11 NOTE — Progress Notes (Signed)
CRITICAL VALUE ALERT  Critical value received: Troponin 4.62  Date of notification: 09/01/2015  Time of notification:  2120  Critical value read back:Yes.    Nurse who received alert:  Velvet Bathe   MD notified (1st page):  E-link  Time of first page:  2120  MD notified (2nd page):  Time of second page:  Responding MD: Warren Lacy  Time MD responded:  2120

## 2015-08-11 NOTE — Consult Note (Addendum)
Referring Physician: Dr Ashok Cordia    Chief Complaint: seizures. LP  HPI:                                                                                                                                         Melissa Becker is an 69 y.o. 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 Patient is sedated with propofol, intubated on the vent, and thus all clinical information was obtained from the chart " Presented to outside hospital on 12/3 with a two-week history of a "headache" per her husband today. Patient is currently intubated and history obtained from the patient's family as well as the electronic medical record. Patient was taking Advil for her chronic and severe headache without relief. At approximately 1 AM on 12/3 she had an unwitnessed fall in their bathroom and was found conscious by her husband. He reports she did have a rightward gaze at the time with questionable left-sided weakness. EMS assisted her in returning to her bed. The patient attempted to get up again with the assistance of her husband and fell due to weakness. At that time she was transported outside hospital. The patient's husband reports she's had no sick contacts or consumed any raw food. At outside hospital where she underwent CT imaging of the brain and while returning to the emergency department had an apparent seizure followed by a second seizure and ultimately went into ventricular fibrillation with cardiac arrest requiring defibrillation 3 times. At that time patient was on dopamine for shock and was given 1 dose of vancomycin as well as Zosyn. The patient was ultimately intubated at outside hospital. Overnight the patient was weaned off of vasopressor infusion but had questionable episodes of seizure-like activity. Patient was given an IV load of Dilantin followed by a Dilantin  drip. The patient was noted to progress to oliguric renal failure just prior to transfer. Patient did reportedly have a fever to 102F at outside hospital as well". Has been afebrile since admission to [facility]. CT brain showed acute moderate RIGHT parietal occipital infarct (watershed versus angular branch of the MCA). Small acute RIGHT cerebellar infarct.  Date last known well: unable to determine Time last known well: unable to determine tPA Given: no, late presentation   Past Medical History  Diagnosis Date  . Hypothyroid   . Type 2 diabetes mellitus ([place])   . Hypertension   . TIA (transient ischemic attack)     history of TIAs  . Neuropathy ([place])   . Ejection fraction   . Aortic stenosis   . Mitral regurgitation   . Latex allergy     Itching  . GI bleed 2014  . CAD (coronary artery disease)   . GERD (gastroesophageal reflux disease)   . Anemia   . Allergy   .  Atrial fibrillation ([place])   . Stroke [facility]) 6 yrs  ago    TIA  . Pneumonia 10-15 years ago  . Osteomyelitis ([place])     Right toe    Past Surgical History  Procedure Laterality Date  . Tonsillectomy    . Cholecystectomy    . Ventral hernia repair    . Esophagogastroduodenoscopy N/A 04/15/2013    Procedure: ESOPHAGOGASTRODUODENOSCOPY (EGD);  Surgeon: Wonda Horner, MD;  Location: [facility] ENDOSCOPY;  Service: Endoscopy;  Laterality: N/A;  . Givens capsule study N/A 04/18/2013    Procedure: GIVENS CAPSULE STUDY;  Surgeon: Wonda Horner, MD;  Location: [facility] ENDOSCOPY;  Service: Endoscopy;  Laterality: N/A;  . Tonsillectomy    . Hernia repair    . Right finger surgery  1982    amputation of right ring finger  . Right toe surgery      right toe surgery-next to big toe amputated  . Colonoscopy with propofol N/A 12/22/2013    Procedure: COLONOSCOPY WITH PROPOFOL;  Surgeon: Winfield Cunas., MD;  Location: [facility] ENDOSCOPY;  Service: Endoscopy;  Laterality: N/A;  . Left heart catheterization with coronary angiogram N/A 04/17/2013     Procedure: LEFT HEART CATHETERIZATION WITH CORONARY ANGIOGRAM;  Surgeon: Peter M Martinique, MD;  Location: [facility] CATH LAB;  Service: Cardiovascular;  Laterality: N/A;  . Abdominal hysterectomy      partial  . Esophagogastroduodenoscopy (egd) with propofol N/A 10/18/2014    Procedure: ESOPHAGOGASTRODUODENOSCOPY (EGD) WITH PROPOFOL;  Surgeon: Winfield Cunas., MD;  Location: [facility] ENDOSCOPY;  Service: Endoscopy;  Laterality: N/A;  . Hot hemostasis N/A 10/18/2014    Procedure: HOT HEMOSTASIS (ARGON PLASMA COAGULATION/BICAP);  Surgeon: Winfield Cunas., MD;  Location: [facility] ENDOSCOPY;  Service: Endoscopy;  Laterality: N/A;    Family History  Problem Relation Age of Onset  . Colon cancer Father   . Cancer Father   . Deep vein thrombosis Father   . Diabetes Father   . Heart disease Father   . Pancreatic cancer Mother   . Heart disease Son   . Varicose Veins Son    Social History:  reports that she quit smoking about 22 years ago. Her smoking use included Cigarettes. She started smoking about 52 years ago. She has a 16 pack-year smoking history. She has never used smokeless tobacco. She reports that she does not drink alcohol or use illicit drugs.  Family history: unable to obtain  Allergies:  Allergies  Allergen Reactions  . Sulfamethoxazole-Trimethoprim Other (See Comments)    Headaches  . Latex Itching    whelps  . Tape     Medications:                                                                                                                           Scheduled: . ampicillin (OMNIPEN) IV  2 g Intravenous Q12H  . antiseptic oral rinse  7 mL Mouth Rinse 10 times per day  . cefTAZidime (  FORTAZ)  IV  2 g Intravenous Q24H  . chlorhexidine gluconate  15 mL Mouth Rinse BID  . heparin  5,000 Units Subcutaneous 3 times per day  . insulin aspart  0-9 Units Subcutaneous 6 times per day  . pantoprazole (PROTONIX) IV  40 mg Intravenous QHS  . vancomycin  1,500 mg Intravenous Q48H    ROS:  unable to obtain due to mental status                                                                                                                                      History obtained from chart review    Physical exam:  Constitutional: critically ill female, intubated on the vent, on low dose propofol.Blood pressure 148/55, pulse 44, temperature 98.6 F (37 C), temperature source Axillary, resp. rate 18, height 5' 6"  (1.676 m), weight 113.5 kg (250 lb 3.6 oz), SpO2 98 %.  Eyes: no jaundice or exophthalmos.  Head: normocephalic. Neck: supple, no bruits, no JVD. Cardiac: no murmurs. Lungs: clear. Abdomen: soft, no tender, no mass. Extremities: no edema, clubbing, or cyanosis.Right 1st & 2nd toe amputations. Finger amputation noted  Skin: no rash  Neurologic Examination:                                                                                                      General: NAD Mental Status: Sedated with propofol, intubated on the vent. Cranial Nerves: Pupils 4 mm bilaterally, reactive. EOM preserved on Doll's maneuver. Corneal reflex present bilaterally. Gag present. Motor: No spontaneous or pain induced motor movements upper extremities. Minimal movements legs Tone and bulk:normal tone throughout; no atrophy noted Sensory:  Moves legs upon painful stimulation Deep Tendon Reflexes:  1 all over Plantars: Right: s/p amputation left big toe   Left: upgoing Coordination and gait: Unable to test due to mental status    Results for orders placed or performed during the hospital encounter of 08/14/2015 (from the past 48 hour(s))  MRSA PCR Screening     Status: None   Collection Time: 08/14/2015  6:20 PM  Result Value Ref Range   MRSA by PCR NEGATIVE NEGATIVE    Comment:        The GeneXpert MRSA Assay (FDA approved for NASAL specimens only), is one component of a comprehensive MRSA colonization surveillance program. It is not intended to diagnose MRSA infection nor to  guide or monitor treatment for MRSA infections.   Comprehensive metabolic panel     Status: Abnormal  Collection Time: 08/28/2015  8:10 PM  Result Value Ref Range   Sodium 142 135 - 145 mmol/L   Potassium 4.3 3.5 - 5.1 mmol/L   Chloride 109 101 - 111 mmol/L   CO2 20 (L) 22 - 32 mmol/L   Glucose, Bld 187 (H) 65 - 99 mg/dL   BUN 45 (H) 6 - 20 mg/dL   Creatinine, Ser 3.91 (H) 0.44 - 1.00 mg/dL   Calcium 7.3 (L) 8.9 - 10.3 mg/dL   Total Protein 6.2 (L) 6.5 - 8.1 g/dL   Albumin 2.7 (L) 3.5 - 5.0 g/dL   AST 51 (H) 15 - 41 U/L   ALT 78 (H) 14 - 54 U/L   Alkaline Phosphatase 60 38 - 126 U/L   Total Bilirubin 0.5 0.3 - 1.2 mg/dL   GFR calc non Af Amer 11 (L) >60 mL/min   GFR calc Af Amer 12 (L) >60 mL/min    Comment: (NOTE) The eGFR has been calculated using the CKD EPI equation. This calculation has not been validated in all clinical situations. eGFR's persistently <60 mL/min signify possible Chronic Kidney Disease.    Anion gap 13 5 - 15  Magnesium     Status: Abnormal   Collection Time: 08/28/2015  8:10 PM  Result Value Ref Range   Magnesium 1.5 (L) 1.7 - 2.4 mg/dL  Phosphorus     Status: Abnormal   Collection Time: 08/15/2015  8:10 PM  Result Value Ref Range   Phosphorus 7.4 (H) 2.5 - 4.6 mg/dL  Amylase     Status: None   Collection Time: 08/31/2015  8:10 PM  Result Value Ref Range   Amylase 54 28 - 100 U/L  Lipase, blood     Status: None   Collection Time: 08/25/2015  8:10 PM  Result Value Ref Range   Lipase 36 11 - 51 U/L  Troponin I     Status: Abnormal   Collection Time: 08/27/2015  8:10 PM  Result Value Ref Range   Troponin I 4.62 (HH) <0.031 ng/mL    Comment:        POSSIBLE MYOCARDIAL ISCHEMIA. SERIAL TESTING RECOMMENDED. CRITICAL RESULT CALLED TO, READ BACK BY AND VERIFIED WITH: HUTTON,T RN 09/02/2015 2115 [address]   Lactic acid, plasma     Status: None   Collection Time: 08/20/2015  8:10 PM  Result Value Ref Range   Lactic Acid, Venous 1.1 0.5 - 2.0 mmol/L  Brain  natriuretic peptide     Status: Abnormal   Collection Time: 09/01/2015  8:10 PM  Result Value Ref Range   B Natriuretic Peptide 1084.1 (H) 0.0 - 100.0 pg/mL  Cortisol     Status: None   Collection Time: 08/26/2015  8:10 PM  Result Value Ref Range   Cortisol, Plasma 64.8 ug/dL    Comment: RESULTS CONFIRMED BY MANUAL DILUTION (NOTE) AM    6.7 - 22.6 ug/dL PM   <10.0       ug/dL   APTT     Status: None   Collection Time: 08/19/2015  8:10 PM  Result Value Ref Range   aPTT 28 24 - 37 seconds  Protime-INR     Status: Abnormal   Collection Time: 08/13/2015  8:10 PM  Result Value Ref Range   Prothrombin Time 16.5 (H) 11.6 - 15.2 seconds   INR 1.32 0.00 - 1.49  Type and screen If need to transfuse blood products please use the blood administration order set     Status: None  Collection Time: 09/01/2015  8:10 PM  Result Value Ref Range   ABO/RH(D) AB POS    Antibody Screen NEG    Sample Expiration 08/14/2015   Procalcitonin - Baseline     Status: None   Collection Time: 08/13/2015  8:10 PM  Result Value Ref Range   Procalcitonin 19.06 ng/mL    Comment:        Interpretation: PCT >= 10 ng/mL: Important systemic inflammatory response, almost exclusively due to severe bacterial sepsis or septic shock. (NOTE)         ICU PCT Algorithm               Non ICU PCT Algorithm    ----------------------------     ------------------------------         PCT < 0.25 ng/mL                 PCT < 0.1 ng/mL     Stopping of antibiotics            Stopping of antibiotics       strongly encouraged.               strongly encouraged.    ----------------------------     ------------------------------       PCT level decrease by               PCT < 0.25 ng/mL       >= 80% from peak PCT       OR PCT 0.25 - 0.5 ng/mL          Stopping of antibiotics                                             encouraged.     Stopping of antibiotics           encouraged.    ----------------------------      ------------------------------       PCT level decrease by              PCT >= 0.25 ng/mL       < 80% from peak PCT        AND PCT >= 0.5 ng/mL             Continuing antibiotics                                              encouraged.       Continuing antibiotics            encouraged.    ----------------------------     ------------------------------     PCT level increase compared          PCT > 0.5 ng/mL         with peak PCT AND          PCT >= 0.5 ng/mL             Escalation of antibiotics                                          strongly encouraged.      Escalation of antibiotics  strongly encouraged.   Phenytoin level, total     Status: Abnormal   Collection Time: 08/09/2015  8:10 PM  Result Value Ref Range   Phenytoin Lvl 4.7 (L) 10.0 - 20.0 ug/mL  Vancomycin, random     Status: None   Collection Time: 08/24/2015  8:10 PM  Result Value Ref Range   Vancomycin Rm 13 ug/mL    Comment:        Random Vancomycin therapeutic range is dependent on dosage and time of specimen collection. A peak range is 20.0-40.0 ug/mL A trough range is 5.0-15.0 ug/mL          ABO/Rh     Status: None (Preliminary result)   Collection Time: 09/02/2015  8:10 PM  Result Value Ref Range   ABO/RH(D) AB POS   Lactic acid, plasma     Status: None   Collection Time: 08/18/2015  9:50 PM  Result Value Ref Range   Lactic Acid, Venous 1.3 0.5 - 2.0 mmol/L  Glucose, capillary     Status: Abnormal   Collection Time: 09/02/2015 10:37 PM  Result Value Ref Range   Glucose-Capillary 149 (H) 65 - 99 mg/dL   Ct Head Wo Contrast  08/30/2015  CLINICAL DATA:  Unwitnessed fall in bathroom 4 days ago, found down by husband. LEFT-sided weakness. Severe headache. Subsequent seizures. History of diabetes, hypertension. EXAM: CT HEAD WITHOUT CONTRAST CT CERVICAL SPINE WITHOUT CONTRAST TECHNIQUE: Multidetector CT imaging of the head and cervical spine was performed following the standard protocol without intravenous  contrast. Multiplanar CT image reconstructions of the cervical spine were also generated. COMPARISON:  CT head August 10, 2015 FINDINGS: CT HEAD FINDINGS Small wedge-like hypodensity RIGHT cerebellum, not clearly present on prior motion degraded examination. Wedge-like area of RIGHT parietal occipital lobe hypodensity with sulcal effacement, extending to the postcentral gyrus. No midline shift, mass lesions. No intraparenchymal hemorrhage. Small area RIGHT frontal encephalomalacia. Ventricles and sulci are overall normal for patient's age. No abnormal extra-axial fluid collections. Basal cisterns are patent. Mild calcific atherosclerosis of the carotid siphons. Ocular globes and orbital contents are unremarkable. Life support lines in place. Paranasal sinuses and mastoid air cells are well aerated. CT CERVICAL SPINE FINDINGS Cervical vertebral bodies and posterior elements are intact and aligned with maintenance of the cervical lordosis. Mild C5-6 disc height loss, minimal uncovertebral hypertrophy compatible with degenerative discs. Moderate to severe LEFT facet arthropathy. No destructive bony lesions. C1-2 articulation maintained. Included prevertebral and paraspinal soft tissues are nonsuspicious; mild calcific atherosclerosis of the carotid bulbs. IMPRESSION: CT HEAD: Acute moderate RIGHT parietal occipital infarct (watershed versus angular branch of the MCA). Small acute RIGHT cerebellar infarct. Small area RIGHT frontal encephalomalacia may be posttraumatic or ischemic. CT CERVICAL SPINE: No acute cervical spine fracture or malalignment. Acute findings discussed with and reconfirmed by Christen Bame, RN Neuro-ICU on 09/05/2015 at 9:38 pm. Electronically Signed   By: Elon Alas M.D.   On: 09/02/2015 21:39   Ct Cervical Spine Wo Contrast  08/28/2015  CLINICAL DATA:  Unwitnessed fall in bathroom 4 days ago, found down by husband. LEFT-sided weakness. Severe headache. Subsequent seizures. History of diabetes,  hypertension. EXAM: CT HEAD WITHOUT CONTRAST CT CERVICAL SPINE WITHOUT CONTRAST TECHNIQUE: Multidetector CT imaging of the head and cervical spine was performed following the standard protocol without intravenous contrast. Multiplanar CT image reconstructions of the cervical spine were also generated. COMPARISON:  CT head August 10, 2015 FINDINGS: CT HEAD FINDINGS Small wedge-like hypodensity RIGHT cerebellum, not clearly present on prior motion  degraded examination. Wedge-like area of RIGHT parietal occipital lobe hypodensity with sulcal effacement, extending to the postcentral gyrus. No midline shift, mass lesions. No intraparenchymal hemorrhage. Small area RIGHT frontal encephalomalacia. Ventricles and sulci are overall normal for patient's age. No abnormal extra-axial fluid collections. Basal cisterns are patent. Mild calcific atherosclerosis of the carotid siphons. Ocular globes and orbital contents are unremarkable. Life support lines in place. Paranasal sinuses and mastoid air cells are well aerated. CT CERVICAL SPINE FINDINGS Cervical vertebral bodies and posterior elements are intact and aligned with maintenance of the cervical lordosis. Mild C5-6 disc height loss, minimal uncovertebral hypertrophy compatible with degenerative discs. Moderate to severe LEFT facet arthropathy. No destructive bony lesions. C1-2 articulation maintained. Included prevertebral and paraspinal soft tissues are nonsuspicious; mild calcific atherosclerosis of the carotid bulbs. IMPRESSION: CT HEAD: Acute moderate RIGHT parietal occipital infarct (watershed versus angular branch of the MCA). Small acute RIGHT cerebellar infarct. Small area RIGHT frontal encephalomalacia may be posttraumatic or ischemic. CT CERVICAL SPINE: No acute cervical spine fracture or malalignment. Acute findings discussed with and reconfirmed by Christen Bame, RN Neuro-ICU on 09/03/2015 at 9:38 pm. Electronically Signed   By: Elon Alas M.D.   On: 08/18/2015  21:39   US Abdomen Complete  08/26/2015  CLINICAL DATA:  Acute onset of renal failure. Elevated transaminases. Initial encounter. EXAM: ULTRASOUND ABDOMEN COMPLETE COMPARISON:  CT of the abdomen and pelvis performed 11/21/2013 FINDINGS: Gallbladder: Status post cholecystectomy.  No retained stones seen. Common bile duct: Diameter: 0.5 cm, within normal limits in caliber. Liver: No focal lesion identified. Within normal limits in parenchymal echogenicity, though mildly coarsened echotexture is noted. IVC: No abnormality visualized. Pancreas: Visualized portion unremarkable. Spleen: Size and appearance within normal limits. Right Kidney: Length: 15.3 cm. Echogenicity within normal limits. No mass or hydronephrosis visualized. Left Kidney: Length: 14.3 cm. Echogenicity within normal limits. No mass or hydronephrosis visualized. Abdominal aorta: No aneurysm visualized. Scattered calcific atherosclerotic disease is noted along the abdominal aorta. Other findings: None. IMPRESSION: 1. Status post cholecystectomy. No acute abnormality seen within the abdomen. 2. Kidneys unremarkable in appearance. 3. Scattered calcific atherosclerotic disease along the abdominal aorta. Electronically Signed   By: Garald Balding M.D.   On: 08/28/2015 21:43   Dg Chest Port 1 View  09/06/2015  CLINICAL DATA:  Acute onset of respiratory failure. Initial encounter. EXAM: PORTABLE CHEST 1 VIEW COMPARISON:  Chest radiograph performed earlier today at 8:59 a.m. FINDINGS: The patient's endotracheal tube is seen ending 3 cm above the carina. An enteric tube is noted extending below the diaphragm. A small left pleural effusion is noted, with left basilar airspace opacification. Underlying vascular congestion is noted. This may reflect pneumonia or asymmetric interstitial edema. No pneumothorax is seen. The cardiomediastinal silhouette is borderline enlarged. No acute osseous abnormalities are identified. IMPRESSION: 1. Endotracheal tube seen  ending 3 cm above the carina. 2. Small left pleural effusion, with left basilar airspace opacification. Underlying vascular congestion and borderline cardiomegaly. This may reflect pneumonia or asymmetric interstitial edema. Electronically Signed   By: Garald Balding M.D.   On: 08/23/2015 19:51    Assessment: 69 y.o. 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 New onset seizure could be symptomatic from acute cortical infarct, or CNS infection.  Recommend: continue IV dilantin 100 mg TID. Dilantin management by pharmacy. MRI/MRA brain when clinically stable.  EEG ruled out NCSE. LP under fluoro (attemped at the bedside but unsuccessful due to body habitus)  Stroke team will follow up tomorrow.    Stroke Risk Factors - age,  DM, HTN, CAD, atrial fibrillation, TIA   Dorian Pod, MD [facility] [phone]  08/25/2015, 11:59 PM

## 2015-08-12 ENCOUNTER — Inpatient Hospital Stay (HOSPITAL_COMMUNITY): Payer: Medicare Other

## 2015-08-12 ENCOUNTER — Ambulatory Visit (HOSPITAL_COMMUNITY): Payer: Medicare Other

## 2015-08-12 ENCOUNTER — Other Ambulatory Visit (HOSPITAL_COMMUNITY): Payer: PRIVATE HEALTH INSURANCE

## 2015-08-12 DIAGNOSIS — G039 Meningitis, unspecified: Secondary | ICD-10-CM | POA: Insufficient documentation

## 2015-08-12 DIAGNOSIS — I6340 Cerebral infarction due to embolism of unspecified cerebral artery: Secondary | ICD-10-CM

## 2015-08-12 DIAGNOSIS — I6330 Cerebral infarction due to thrombosis of unspecified cerebral artery: Secondary | ICD-10-CM

## 2015-08-12 DIAGNOSIS — J9621 Acute and chronic respiratory failure with hypoxia: Secondary | ICD-10-CM

## 2015-08-12 DIAGNOSIS — J9690 Respiratory failure, unspecified, unspecified whether with hypoxia or hypercapnia: Secondary | ICD-10-CM | POA: Insufficient documentation

## 2015-08-12 DIAGNOSIS — I639 Cerebral infarction, unspecified: Secondary | ICD-10-CM | POA: Insufficient documentation

## 2015-08-12 DIAGNOSIS — I4891 Unspecified atrial fibrillation: Secondary | ICD-10-CM

## 2015-08-12 DIAGNOSIS — E785 Hyperlipidemia, unspecified: Secondary | ICD-10-CM

## 2015-08-12 LAB — POCT I-STAT 3, ART BLOOD GAS (G3+)
Acid-base deficit: 6 mmol/L — ABNORMAL HIGH (ref 0.0–2.0)
Bicarbonate: 19.5 mEq/L — ABNORMAL LOW (ref 20.0–24.0)
O2 Saturation: 97 %
PCO2 ART: 37.7 mmHg (ref 35.0–45.0)
PH ART: 7.32 — AB (ref 7.350–7.450)
Patient temperature: 98
TCO2: 21 mmol/L (ref 0–100)
pO2, Arterial: 94 mmHg (ref 80.0–100.0)

## 2015-08-12 LAB — GLUCOSE, CAPILLARY
GLUCOSE-CAPILLARY: 140 mg/dL — AB (ref 65–99)
GLUCOSE-CAPILLARY: 147 mg/dL — AB (ref 65–99)
GLUCOSE-CAPILLARY: 152 mg/dL — AB (ref 65–99)
GLUCOSE-CAPILLARY: 157 mg/dL — AB (ref 65–99)
Glucose-Capillary: 143 mg/dL — ABNORMAL HIGH (ref 65–99)

## 2015-08-12 LAB — TROPONIN I
Troponin I: 3.89 ng/mL (ref <0.031)
Troponin I: 2.48 ng/mL (ref <0.031)

## 2015-08-12 LAB — BASIC METABOLIC PANEL
ANION GAP: 15 (ref 5–15)
BUN: 49 mg/dL — ABNORMAL HIGH (ref 6–20)
CALCIUM: 7 mg/dL — AB (ref 8.9–10.3)
CO2: 16 mmol/L — AB (ref 22–32)
Chloride: 111 mmol/L (ref 101–111)
Creatinine, Ser: 4.26 mg/dL — ABNORMAL HIGH (ref 0.44–1.00)
GFR calc non Af Amer: 10 mL/min — ABNORMAL LOW (ref >60)
GFR, EST AFRICAN AMERICAN: 11 mL/min — AB (ref >60)
Glucose, Bld: 171 mg/dL — ABNORMAL HIGH (ref 65–99)
POTASSIUM: 4.3 mmol/L (ref 3.5–5.1)
Sodium: 142 mmol/L (ref 135–145)

## 2015-08-12 LAB — CBC
HEMATOCRIT: 30.1 % — AB (ref 36.0–46.0)
HEMOGLOBIN: 9.1 g/dL — AB (ref 12.0–15.0)
MCH: 27.5 pg (ref 26.0–34.0)
MCHC: 30.2 g/dL (ref 30.0–36.0)
MCV: 90.9 fL (ref 78.0–100.0)
Platelets: 183 10*3/uL (ref 150–400)
RBC: 3.31 MIL/uL — AB (ref 3.87–5.11)
RDW: 15.5 % (ref 11.5–15.5)
WBC: 13.5 10*3/uL — AB (ref 4.0–10.5)

## 2015-08-12 LAB — PROCALCITONIN: PROCALCITONIN: 16.71 ng/mL

## 2015-08-12 LAB — RAPID URINE DRUG SCREEN, HOSP PERFORMED
Amphetamines: NOT DETECTED
BARBITURATES: NOT DETECTED
BENZODIAZEPINES: POSITIVE — AB
Cocaine: NOT DETECTED
Opiates: POSITIVE — AB
Tetrahydrocannabinol: NOT DETECTED

## 2015-08-12 LAB — ABO/RH: ABO/RH(D): AB POS

## 2015-08-12 LAB — STREP PNEUMONIAE URINARY ANTIGEN: STREP PNEUMO URINARY ANTIGEN: NEGATIVE

## 2015-08-12 LAB — SODIUM, URINE, RANDOM: SODIUM UR: 60 mmol/L

## 2015-08-12 LAB — CREATININE, URINE, RANDOM: CREATININE, URINE: 149.04 mg/dL

## 2015-08-12 LAB — PHENYTOIN LEVEL, TOTAL: PHENYTOIN LVL: 5.3 ug/mL — AB (ref 10.0–20.0)

## 2015-08-12 LAB — MAGNESIUM: Magnesium: 1.4 mg/dL — ABNORMAL LOW (ref 1.7–2.4)

## 2015-08-12 LAB — TRIGLYCERIDES: TRIGLYCERIDES: 146 mg/dL (ref <150)

## 2015-08-12 LAB — PLATELET COUNT: PLATELETS: 207 10*3/uL (ref 150–400)

## 2015-08-12 LAB — PHOSPHORUS: PHOSPHORUS: 7.1 mg/dL — AB (ref 2.5–4.6)

## 2015-08-12 MED ORDER — SODIUM CHLORIDE 0.9 % IV BOLUS (SEPSIS)
500.0000 mL | Freq: Once | INTRAVENOUS | Status: AC
  Administered 2015-08-12: 500 mL via INTRAVENOUS

## 2015-08-12 MED ORDER — PANTOPRAZOLE SODIUM 40 MG PO PACK
40.0000 mg | PACK | Freq: Every day | ORAL | Status: DC
  Administered 2015-08-12 – 2015-08-21 (×10): 40 mg
  Filled 2015-08-12 (×9): qty 20

## 2015-08-12 MED ORDER — VITAL HIGH PROTEIN PO LIQD
1000.0000 mL | ORAL | Status: DC
  Administered 2015-08-13 (×3)
  Administered 2015-08-13: 1000 mL
  Administered 2015-08-13: 12:00:00
  Administered 2015-08-14 – 2015-08-15 (×2): 1000 mL
  Filled 2015-08-12 (×8): qty 1000

## 2015-08-12 MED ORDER — DEXTROSE 5 % IV SOLN
INTRAVENOUS | Status: DC
  Administered 2015-08-12: 12:00:00 via INTRAVENOUS
  Filled 2015-08-12 (×3): qty 150

## 2015-08-12 MED ORDER — ASPIRIN 325 MG PO TABS
325.0000 mg | ORAL_TABLET | Freq: Every day | ORAL | Status: DC
  Administered 2015-08-12: 325 mg via ORAL
  Filled 2015-08-12 (×2): qty 1

## 2015-08-12 MED ORDER — PHENYTOIN SODIUM 50 MG/ML IJ SOLN
100.0000 mg | Freq: Three times a day (TID) | INTRAMUSCULAR | Status: DC
  Administered 2015-08-12 – 2015-08-13 (×4): 100 mg via INTRAVENOUS
  Filled 2015-08-12 (×4): qty 2

## 2015-08-12 MED ORDER — LEVOTHYROXINE SODIUM 100 MCG PO TABS
100.0000 ug | ORAL_TABLET | Freq: Every day | ORAL | Status: DC
  Administered 2015-08-13 – 2015-08-21 (×9): 100 ug
  Filled 2015-08-12 (×10): qty 1

## 2015-08-12 MED ORDER — VITAL HIGH PROTEIN PO LIQD
1000.0000 mL | ORAL | Status: DC
  Filled 2015-08-12 (×2): qty 1000

## 2015-08-12 MED ORDER — PRO-STAT SUGAR FREE PO LIQD
30.0000 mL | Freq: Two times a day (BID) | ORAL | Status: DC
  Administered 2015-08-12 – 2015-08-21 (×18): 30 mL
  Filled 2015-08-12 (×19): qty 30

## 2015-08-12 MED ORDER — PHENYTOIN SODIUM 50 MG/ML IJ SOLN
100.0000 mg | Freq: Once | INTRAMUSCULAR | Status: AC
  Administered 2015-08-12: 100 mg via INTRAVENOUS
  Filled 2015-08-12: qty 2

## 2015-08-12 MED FILL — Fentanyl Citrate Preservative Free (PF) Inj 100 MCG/2ML: INTRAMUSCULAR | Qty: 2 | Status: AC

## 2015-08-12 NOTE — Progress Notes (Signed)
RT note: Pt. Returned to 3M08 from MRI, no complications, RT to monitor.

## 2015-08-12 NOTE — Progress Notes (Addendum)
Noted pt L shoulder appeared abnormal. Large bruise to L arm. Pt urine output low, around 50 mL for my shift. Orders given for 500 cc bolus. Will administer and continue to monitor. Orders given for L shoulder xray.

## 2015-08-12 NOTE — Progress Notes (Signed)
STROKE TEAM PROGRESS NOTE   HISTORY Melissa Becker is an 69 y.o. 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  Patient is sedated with propofol, intubated on the vent, and thus all clinical information was obtained from the chart " Presented to outside hospital on 12/3 with a two-week history of a "headache" per her husband today. Patient is currently intubated and history obtained from the patient's family as well as the electronic medical record. Patient was taking Advil for her chronic and severe headache without relief. At approximately 1 AM on 12/3 she had an unwitnessed fall in their bathroom and was found conscious by her husband. He reports she did have a rightward gaze at the time with questionable left-sided weakness. EMS assisted her in returning to her bed. The patient attempted to get up again with the assistance of her husband and fell due to weakness. At that time she was transported outside hospital. The patient's husband reports she's had no sick contacts or consumed any raw food. At outside hospital where she underwent CT imaging of the brain and while returning to the emergency department had an apparent seizure followed by a second seizure and ultimately went into ventricular fibrillation with cardiac arrest requiring defibrillation 3 times. At that time patient was on dopamine for shock and was given 1 dose of vancomycin as well as Zosyn. The patient was ultimately intubated at outside hospital. Overnight the patient was weaned off of vasopressor infusion but had questionable episodes of seizure-like activity. Patient was given an IV load of Dilantin followed by a Dilantin drip. The patient was noted to progress to oliguric renal failure just prior to transfer. Patient did reportedly have a fever to 102F at outside hospital as well". Has  been afebrile since admission to [facility]. CT brain showed acute moderate RIGHT parietal occipital infarct (watershed versus angular branch of the MCA). Small acute RIGHT cerebellar infarct. Her last known well is unclear. Patient was not administered TPA secondary to late presentation.    SUBJECTIVE (INTERVAL HISTORY) Her RN is at the bedside. No family present. She is still intubated and sedated. Able to move LEs on pain stimulation but not UEs. MRI brain embolic stroke pattern, not consistent with encephalitis. LP under fluro failed to get CSF.     OBJECTIVE Temp:  [98 F (36.7 C)-98.7 F (37.1 C)] 98.6 F (37 C) (12/05 0733) Pulse Rate:  [43-86] 86 (12/05 0800) Cardiac Rhythm:  [-] Heart block (12/05 0400) Resp:  [18-20] 19 (12/05 0800) BP: (124-157)/(50-73) 157/73 mmHg (12/05 0800) SpO2:  [97 %-100 %] 100 % (12/05 0800) FiO2 (%):  [40 %] 40 % (12/05 0800) Weight:  [113.5 kg (250 lb 3.6 oz)-114.6 kg (252 lb 10.4 oz)] 114.6 kg (252 lb 10.4 oz) (12/05 0500)  CBC:  Recent Labs Lab 08/12/15 0015 08/12/15 0635  WBC  --  13.5*  HGB  --  9.1*  HCT  --  30.1*  MCV  --  90.9  PLT 207 [id number]    Basic Metabolic Panel:  Recent Labs Lab 08/21/2015 2010 08/12/15 0635  NA 142 142  K 4.3 4.3  CL 109 111  CO2 20* 16*  GLUCOSE 187* 171*  BUN 45* 49*  CREATININE 3.91* 4.26*  CALCIUM 7.3* 7.0*  MG 1.5* 1.4*  PHOS 7.4* 7.1*    Lipid Panel:    Component Value Date/Time  CHOL 104 04/15/2013 0450   TRIG 146 08/12/2015 0055   HDL 36* 04/15/2013 0450   CHOLHDL 2.9 04/15/2013 0450   VLDL 19 04/15/2013 0450   LDLCALC 49 04/15/2013 0450   HgbA1c: No results found for: HGBA1C Urine Drug Screen: No results found for: LABOPIA, COCAINSCRNUR, LABBENZ, AMPHETMU, THCU, LABBARB    IMAGING I have personally reviewed the radiological images below and agree with the radiology interpretations.  CT HEAD 09/01/2015   Acute moderate RIGHT parietal occipital infarct (watershed versus angular branch  of the MCA). Small acute RIGHT cerebellar infarct. Small area RIGHT frontal encephalomalacia may be posttraumatic or ischemic.   CT CERVICAL SPINE 08/21/2015  No acute cervical spine fracture or malalignment.   US Abdomen Complete 08/28/2015  1. Status post cholecystectomy. No acute abnormality seen within the abdomen. 2. Kidneys unremarkable in appearance. 3. Scattered calcific atherosclerotic disease along the abdominal aorta.   Dg Chest Port 1 View 09/03/2015   1. Endotracheal tube seen ending 3 cm above the carina. 2. Small left pleural effusion, with left basilar airspace opacification. Underlying vascular congestion and borderline cardiomegaly. This may reflect pneumonia or asymmetric interstitial edema.   Dg Shoulder Left Port 08/12/2015  No fracture or dislocation.    MRI HEAD 08/12/2015  Exam is motion degraded. Numerous bilateral acute/subacute nonhemorrhagic infarcts. Most confluent infarcts have an appearance of acute/subacute infarct involves portions of the right frontal -parietal - occipital and posterior right temporal lobe. Swelling of gyri without significant local mass effect. Smaller acute nonhemorrhagic infarcts scattered throughout the hemispheres bilaterally (frontal lobes, parietal lobes, occipital lobes), right temporal lobe/subinsular region and cerebellum bilaterally. The involvement of multiple vascular distributions raises possibility of embolic disease. Remote small right cerebellar infarct.   MRA HEAD Exam is slightly motion degraded. Mild irregularity and slight narrowing cavernous segment right internal carotid artery. Small bulge may be related to atherosclerotic type changes rather than aneurysm. No significant stenosis of either carotid terminus or M1 segment of either middle cerebral artery. Middle cerebral artery branch vessel irregularity and narrowing bilaterally. Mild narrowing and irregularity A1 segment right anterior cerebral artery. Bulge of the distal A1  segment of the right anterior cerebral artery appears to be origin of a vessel rather than saccular aneurysm. Mild to moderate narrowing A2 segment right anterior cerebral artery. Left vertebral artery is dominant. Mild to moderate narrowing distal right vertebral artery. Mild to moderate narrowing portions of the posterior inferior cerebellar artery bilaterally. Mild to moderate narrowing portions of the anterior inferior cerebellar artery bilaterally. Narrowing of the superior cerebellar artery more notable on the left. Mild to moderate narrowing portions of the proximal, mid and distal aspect of the posterior cerebral artery bilaterally. Tiny bulge superior margin P1 segments left posterior cerebral artery. Question tiny aneurysm (less than 2 mm). A vessel may rise from this region.   CUS - pending  2D echo - pending   PHYSICAL EXAM Physical exam  Temp:  [98 F (36.7 C)-98.7 F (37.1 C)] 98.6 F (37 C) (12/05 0733) Pulse Rate:  [31-86] 31 (12/05 1200) Resp:  [16-20] 18 (12/05 1200) BP: (124-157)/(50-95) 125/90 mmHg (12/05 1200) SpO2:  [95 %-100 %] 95 % (12/05 1200) FiO2 (%):  [40 %] 40 % (12/05 1212) Weight:  [250 lb 3.6 oz (113.5 kg)-252 lb 10.4 oz (114.6 kg)] 252 lb 10.4 oz (114.6 kg) (12/05 0500)  General - Well nourished, well developed, intubated and sedated.  Ophthalmologic - Fundi not visualized due to agitation.  Cardiovascular - Regular rhythm, tachycardia.  Neuro - intubated and sedated, not following commands. PERRL, doll's eyes present, b/l corneal weak reflex but R stronger blinking than left, gag present, breathing over the vent, BLEs withdraw on pain stimulation, BLE 0/5 on pain stimulation, DTR 1+ throughout, no babinski bilaterally.   ASSESSMENT/PLAN Melissa Becker is a 69 y.o. female with history of DM, HTN, CAD, atrial fibrillation, TIA, GI bleed presenting with complaints of a HA who fell at home due to weakness who developed seizures followed by v fib/cardiac  arrest at an outlying hospital. She was intubated and transferred to done where she was found to have a R parietal occipital infarct and small R cerebellar infarct. She did not receive IV t-PA due to unknown time last known well.   Stroke:  bilateral anterior and posterior, infra and supratentorial infarcts, embolic secondary to known atrial fibrillation not on AC  Resultant  AMS, seizure and BUE plegia  MRI  bilateral anterior and posterior, infra and supratentorial infarcts, largest at right temporal lobe at MCA distribution  MRA diffuse intracranial atherosclerosis, ? Tiny L PCA aneurysm  Carotid Doppler  pending  2D Echo  pending  LDL pending  HgbA1c pending  Heparin 5000 units sq tid for VTE prophylaxis  Diet NPO time specified  No antithrombotic prior to admission, now on ASA 325mg  for stroke prevention. No AC at this time due to risk of hemorrhagic transformation in the setting of large infarcts at right temporal lobe. Will consider AC in 5-7 days.   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  procalcitonin 19.06 -> 16.71  Reported fever 102  So far afebrile  WBC 13.5  On ampicillin, ceftazidime, and vanco for empirical coverage  Not acyclovir candidate due to AKI  MRI lesion within vascular distribution, not consistent with HSV encephalitis   Failed LP under fluro  Vent dependent respiratory failure  Intubated  CCM managing  Atrial Fibrillation  Home anticoagulation:  No antithrombotics at home  CHA2DS2-VASc Score = 6, ?2 oral anticoagulation recommended  Age in Years:  23-74   +1   Sex:  Female   +1    Hypertension History: yes   +1     Diabetes Mellitus:  yes   +1   Congestive Heart Failure History:  0  Vascular Disease History:  0      Stroke/TIA/Thromboembolism History: yes   +2  On ASA for now. Hold off AC for 5-7 days to avoid hemorrhagic transformation.    Hypertension  Stable Permissive hypertension (OK if < 220/120) but gradually normalize in 5-7 days  Hyperlipidemia  Home meds:  lipitor 80 resumed in hospital  LDL pending, goal < 70  Continue statin at discharge  Diabetes type II  HgbA1c pending, goal < 7.0  Controlled  Other Stroke Risk Factors  Advanced age  Former Cigarette smoker, quit smoking 22 years ago   Morbid Obesity, Body mass index is 40.8 kg/(m^2).   Hx stroke/TIA  Coronary artery disease  Aortic stenosis  Other Active Problems  Hypothyroid  Osteomyelitis R foot  Hospital day # 1  This patient is critically ill due to multifocal emboli stroke, seizure, respiratory failure, sepsis and at significant risk of neurological worsening, death form recurrent stroke, cerebral edema, brain herniation, heart failure, septic shock. This patient's care requires constant monitoring of vital signs,  hemodynamics, respiratory and cardiac monitoring, review of multiple databases, neurological assessment, discussion with family, other specialists and medical decision making of high complexity. I spent 55 minutes of neurocritical care time in the care of this patient.  Rosalin Hawking, MD PhD Stroke Neurology 08/12/2015 2:50 PM    To contact Stroke Continuity provider, please refer to [email or web address] After hours, contact General Neurology

## 2015-08-12 NOTE — Progress Notes (Signed)
EEG completed, results pending. 

## 2015-08-12 NOTE — Progress Notes (Signed)
RT Note: Pt transported to MRI & back to 53M with no complications. Pt tolerated well. RT will continue to monitor.

## 2015-08-12 NOTE — Care Management Note (Signed)
Case Management Note  Patient Details  Name: Melissa Becker MRN: [id number] Date of Birth: June 12, 1946  Subjective/Objective:       Pt admitted on 09/04/2015 with stroke, sepsis, and UTI.  PTA, pt resided at home with spouse.               Action/Plan: Pt currently sedated and on ventilator.  Will follow for discharge planning as pt progresses.    Expected Discharge Date:                  Expected Discharge Plan:  IP Rehab Facility  In-House Referral:  Clinical Social Work  Discharge planning Services  CM Consult  Post Acute Care Choice:    Choice offered to:     DME Arranged:    DME Agency:     HH Arranged:    [place] Agency:     Status of Service:  In process, will continue to follow  Medicare Important Message Given:    Date Medicare IM Given:    Medicare IM give by:    Date Additional Medicare IM Given:    Additional Medicare Important Message give by:     If discussed at [place] of Stay Meetings, dates discussed:    Additional Comments:  Reinaldo Raddle, RN, BSN  Trauma/Neuro ICU Case Manager [phone]

## 2015-08-12 NOTE — Progress Notes (Signed)
Notified E-link that pt had 8 beat run [facility]. No new orders given at this time. Will continue to monitor.

## 2015-08-12 NOTE — Progress Notes (Signed)
CONSULT NOTE - INITIAL  Pharmacy Consult for Dilantin Indication: new-onset seizure  Allergies  Allergen Reactions  . Sulfamethoxazole-Trimethoprim Other (See Comments)    Headaches  . Latex Itching    whelps  . Tape     Patient Measurements: Height: 5\' 6"  (167.6 cm) Weight: 250 lb 3.6 oz (113.5 kg) IBW/kg (Calculated) : 59.3  Vital Signs: Temp: 98 F (36.7 C) (12/05 0000) Temp Source: Axillary (12/05 0000) BP: 157/51 mmHg (12/05 0200) Pulse Rate: 43 (12/05 0200)  Labs:  Recent Labs  09/04/2015 2010 08/12/15 0015  PLT  --  207  CREATININE 3.91*  --    Estimated Creatinine Clearance: 17.1 mL/min (by C-G formula based on Cr of 3.91).  Medical History: Past Medical History  Diagnosis Date  . Hypothyroid   . Type 2 diabetes mellitus ([place])   . Hypertension   . TIA (transient ischemic attack)     history of TIAs  . Neuropathy ([place])   . Ejection fraction   . Aortic stenosis   . Mitral regurgitation   . Latex allergy     Itching  . GI bleed 2014  . CAD (coronary artery disease)   . GERD (gastroesophageal reflux disease)   . Anemia   . Allergy   . Atrial fibrillation ([place])   . Stroke [facility]) 6 yrs  ago    TIA  . Pneumonia 10-15 years ago  . Osteomyelitis ([place])     Right toe     Assessment: 69yo female was admitted to OSH 12/3 for CVA, had sz in OSH ED followed by Vfib arrest, now tx'd to [facility] neuro ICU for further mgmt, to continue Dilantin started at OSH; sz possibly 2/2 acute cortical infarct vs CNS infection (on ABX).  Goal of Therapy:  Dilantin level 10-20  Plan:  Rec'd Dilantin LD of 1g on 12/4 at 0900 followed by 100mg  IV Q8H; will continue for now and check level with am labs tomorrow (pt missed most recent dose 2/2 transfer so will wait for more accurate results).  Wynona Neat, PharmD, BCPS  08/12/2015,2:23 AM

## 2015-08-12 NOTE — Progress Notes (Signed)
[place] Progress Note Patient Name: Melissa Becker DOB: 02/12/1946 MRN: [id number]   Date of Service  08/12/2015  HPI/Events of Note  Rn notes left shoulder appears abnormal. Large bruise, concern for dislocation oliguria  eICU Interventions  Shoulder X-ray Bolus 500cc saline     Intervention Category Intermediate Interventions: Oliguria - evaluation and management;Pain - evaluation and management  Simonne Maffucci 08/12/2015, 12:27 AM

## 2015-08-12 NOTE — Progress Notes (Signed)
  Echocardiogram 2D Echocardiogram has been performed.  Joelene Millin 08/12/2015, 2:22 PM

## 2015-08-12 NOTE — Progress Notes (Signed)
PULMONARY / CRITICAL CARE MEDICINE   Name: Melissa Becker MRN: [id number] DOB: 1946/08/21    ADMISSION DATE:  08/19/2015  REFERRING MD:  Anselmo Pickler, D.O. [facility])  CHIEF COMPLAINT:  Sepsis  SUBJECTIVE:  Febrile overnight.  VITAL SIGNS: BP 157/73 mmHg  Pulse 86  Temp(Src) 98.6 F (37 C) (Axillary)  Resp 19  Ht 5\' 6"  (1.676 m)  Wt 252 lb 10.4 oz (114.6 kg)  BMI 40.80 kg/m2  SpO2 100%  HEMODYNAMICS:    VENTILATOR SETTINGS: Vent Mode:  [-] PRVC FiO2 (%):  [40 %] 40 % Set Rate:  [16 bmp] 16 bmp Vt Set:  [550 mL] 550 mL PEEP:  [5 cmH20] 5 cmH20 Plateau Pressure:  [15 cmH20-18 cmH20] 18 cmH20  INTAKE / OUTPUT: I/O last 3 completed shifts: In: 1185.6 [I.V.:85.6; IV Piggyback:1100] Out: 180 [Urine:180]  PHYSICAL EXAMINATION: General: RASS -2 Neuro: pupils reactive, ETT in place Cardiac: irregular, no murmur Chest: b/l crackles, paradoxical breathing >> resolved with change to pressure control Abd: soft, non tender Ext: no edema Skin: no rahse  LABS; CBC Recent Labs     08/12/15  0015  08/12/15  0635  WBC   --   13.5*  HGB   --   9.1*  HCT   --   30.1*  PLT  207  183    Coag's Recent Labs     08/16/2015  2010  APTT  28  INR  1.32    BMET Recent Labs     08/30/2015  2010  08/12/15  0635  NA  142  142  K  4.3  4.3  CL  109  111  CO2  20*  16*  BUN  45*  49*  CREATININE  3.91*  4.26*  GLUCOSE  187*  171*    Electrolytes Recent Labs     08/20/2015  2010  08/12/15  0635  CALCIUM  7.3*  7.0*  MG  1.5*  1.4*  PHOS  7.4*  7.1*    Sepsis Markers Recent Labs     08/10/2015  2010  08/12/15  0635  PROCALCITON  19.06  16.71    ABG Recent Labs     08/12/15  0055  PHART  7.320*  PCO2ART  37.7  PO2ART  94.0    Liver Enzymes Recent Labs     08/25/2015  2010  AST  51*  ALT  78*  ALKPHOS  60  BILITOT  0.5  ALBUMIN  2.7*    Cardiac Enzymes Recent Labs     08/16/2015  2010  08/12/15  0055  08/12/15  0635  TROPONINI   4.62*  3.89*  2.48*    Glucose Recent Labs     08/16/2015  2237  08/12/15  0010  08/12/15  0317  08/12/15  0731  GLUCAP  149*  157*  152*  143*    Imaging Ct Head Wo Contrast  08/16/2015  CLINICAL DATA:  Unwitnessed fall in bathroom 4 days ago, found down by husband. LEFT-sided weakness. Severe headache. Subsequent seizures. History of diabetes, hypertension. EXAM: CT HEAD WITHOUT CONTRAST CT CERVICAL SPINE WITHOUT CONTRAST TECHNIQUE: Multidetector CT imaging of the head and cervical spine was performed following the standard protocol without intravenous contrast. Multiplanar CT image reconstructions of the cervical spine were also generated. COMPARISON:  CT head August 10, 2015 FINDINGS: CT HEAD FINDINGS Small wedge-like hypodensity RIGHT cerebellum, not clearly present on prior motion degraded examination. Wedge-like area of RIGHT parietal occipital lobe hypodensity  with sulcal effacement, extending to the postcentral gyrus. No midline shift, mass lesions. No intraparenchymal hemorrhage. Small area RIGHT frontal encephalomalacia. Ventricles and sulci are overall normal for patient's age. No abnormal extra-axial fluid collections. Basal cisterns are patent. Mild calcific atherosclerosis of the carotid siphons. Ocular globes and orbital contents are unremarkable. Life support lines in place. Paranasal sinuses and mastoid air cells are well aerated. CT CERVICAL SPINE FINDINGS Cervical vertebral bodies and posterior elements are intact and aligned with maintenance of the cervical lordosis. Mild C5-6 disc height loss, minimal uncovertebral hypertrophy compatible with degenerative discs. Moderate to severe LEFT facet arthropathy. No destructive bony lesions. C1-2 articulation maintained. Included prevertebral and paraspinal soft tissues are nonsuspicious; mild calcific atherosclerosis of the carotid bulbs. IMPRESSION: CT HEAD: Acute moderate RIGHT parietal occipital infarct (watershed versus angular branch  of the MCA). Small acute RIGHT cerebellar infarct. Small area RIGHT frontal encephalomalacia may be posttraumatic or ischemic. CT CERVICAL SPINE: No acute cervical spine fracture or malalignment. Acute findings discussed with and reconfirmed by Christen Bame, RN Neuro-ICU on 08/23/2015 at 9:38 pm. Electronically Signed   By: Elon Alas M.D.   On: 08/29/2015 21:39   Ct Cervical Spine Wo Contrast  08/21/2015  CLINICAL DATA:  Unwitnessed fall in bathroom 4 days ago, found down by husband. LEFT-sided weakness. Severe headache. Subsequent seizures. History of diabetes, hypertension. EXAM: CT HEAD WITHOUT CONTRAST CT CERVICAL SPINE WITHOUT CONTRAST TECHNIQUE: Multidetector CT imaging of the head and cervical spine was performed following the standard protocol without intravenous contrast. Multiplanar CT image reconstructions of the cervical spine were also generated. COMPARISON:  CT head August 10, 2015 FINDINGS: CT HEAD FINDINGS Small wedge-like hypodensity RIGHT cerebellum, not clearly present on prior motion degraded examination. Wedge-like area of RIGHT parietal occipital lobe hypodensity with sulcal effacement, extending to the postcentral gyrus. No midline shift, mass lesions. No intraparenchymal hemorrhage. Small area RIGHT frontal encephalomalacia. Ventricles and sulci are overall normal for patient's age. No abnormal extra-axial fluid collections. Basal cisterns are patent. Mild calcific atherosclerosis of the carotid siphons. Ocular globes and orbital contents are unremarkable. Life support lines in place. Paranasal sinuses and mastoid air cells are well aerated. CT CERVICAL SPINE FINDINGS Cervical vertebral bodies and posterior elements are intact and aligned with maintenance of the cervical lordosis. Mild C5-6 disc height loss, minimal uncovertebral hypertrophy compatible with degenerative discs. Moderate to severe LEFT facet arthropathy. No destructive bony lesions. C1-2 articulation maintained. Included  prevertebral and paraspinal soft tissues are nonsuspicious; mild calcific atherosclerosis of the carotid bulbs. IMPRESSION: CT HEAD: Acute moderate RIGHT parietal occipital infarct (watershed versus angular branch of the MCA). Small acute RIGHT cerebellar infarct. Small area RIGHT frontal encephalomalacia may be posttraumatic or ischemic. CT CERVICAL SPINE: No acute cervical spine fracture or malalignment. Acute findings discussed with and reconfirmed by Christen Bame, RN Neuro-ICU on 08/09/2015 at 9:38 pm. Electronically Signed   By: Elon Alas M.D.   On: 09/04/2015 21:39   Mr Virgel Paling Wo Contrast  08/12/2015  CLINICAL DATA:  69 year old diabetic hypertensive female with coronary artery disease presenting with 2 week history of headache. Unwitnessed fall 5 days ago. Left-sided weakness. Subsequent encounter. EXAM: MRI HEAD WITHOUT CONTRAST MRA HEAD WITHOUT CONTRAST TECHNIQUE: Multiplanar, multiecho pulse sequences of the brain and surrounding structures were obtained without intravenous contrast. Angiographic images of the head were obtained using MRA technique without contrast. COMPARISON:  08/26/2015 head CT.  02/13/2010 brain MR. FINDINGS: MRI HEAD FINDINGS Exam is motion degraded. Numerous bilateral acute/subacute nonhemorrhagic infarcts. Most confluent  infarcts have an appearance of acute/subacute infarct involves portions of the right frontal -parietal - occipital and posterior right temporal lobe. Swelling of gyri without significant local mass effect. Smaller acute nonhemorrhagic infarcts scattered throughout the hemispheres bilaterally (frontal lobes, parietal lobes, occipital lobes), right temporal lobe/subinsular region and cerebellum bilaterally. The involvement of multiple vascular distributions raises possibility of embolic disease. Remote small right cerebellar infarct. No intracranial hemorrhage. Mild global atrophy without hydrocephalus. No intracranial mass lesion noted on this unenhanced exam.  Mild exophthalmos. Partially empty expanded sella without flattening of the posterior globes as may be seen with pseudotumor. Cervical medullary junction and pineal region unremarkable. MRA HEAD FINDINGS Exam is slightly motion degraded. Mild irregularity and slight narrowing cavernous segment right internal carotid artery where there is a minimal bulge which has an appearance most suggestive of result of atherosclerotic type changes rather than aneurysm. Fetal type contribution to the posterior cerebral arteries bilaterally. No significant stenosis of either carotid terminus or M1 segment of either middle cerebral artery. Middle cerebral artery branch vessel irregularity and narrowing bilaterally. Mild narrowing and irregularity A1 segment right anterior cerebral artery. Bulge of the distal A1 segment of the right anterior cerebral artery appears to be origin of a vessel rather than saccular aneurysm. Mild to moderate narrowing A2 segment right anterior cerebral artery. Left vertebral artery is dominant. Mild moderate narrowing distal right vertebral artery. Mild to moderate narrowing portions of the posterior inferior cerebellar artery bilaterally. Mild irregularity and minimal narrowing basilar artery without high-grade stenosis. Mild to moderate narrowing portions of the anterior inferior cerebellar artery bilaterally. Narrowing of the superior cerebellar artery more notable on the left. Mild to moderate narrowing portions of the proximal, mid and distal aspect of the posterior cerebral artery bilaterally. Tiny bulge superior margin P1 segments left posterior cerebral artery. Question tiny aneurysm (less than 2 mm). A vessel may rise from this region. IMPRESSION: MRI HEAD Exam is motion degraded. Numerous bilateral acute/subacute nonhemorrhagic infarcts. Most confluent infarcts have an appearance of acute/subacute infarct involves portions of the right frontal -parietal - occipital and posterior right temporal  lobe. Swelling of gyri without significant local mass effect. Smaller acute nonhemorrhagic infarcts scattered throughout the hemispheres bilaterally (frontal lobes, parietal lobes, occipital lobes), right temporal lobe/subinsular region and cerebellum bilaterally. The involvement of multiple vascular distributions raises possibility of embolic disease. Remote small right cerebellar infarct. MRA HEAD Exam is slightly motion degraded. Mild irregularity and slight narrowing cavernous segment right internal carotid artery. Small bulge may be related to atherosclerotic type changes rather than aneurysm. No significant stenosis of either carotid terminus or M1 segment of either middle cerebral artery. Middle cerebral artery branch vessel irregularity and narrowing bilaterally. Mild narrowing and irregularity A1 segment right anterior cerebral artery. Bulge of the distal A1 segment of the right anterior cerebral artery appears to be origin of a vessel rather than saccular aneurysm. Mild to moderate narrowing A2 segment right anterior cerebral artery. Left vertebral artery is dominant. Mild to moderate narrowing distal right vertebral artery. Mild to moderate narrowing portions of the posterior inferior cerebellar artery bilaterally. Mild to moderate narrowing portions of the anterior inferior cerebellar artery bilaterally. Narrowing of the superior cerebellar artery more notable on the left. Mild to moderate narrowing portions of the proximal, mid and distal aspect of the posterior cerebral artery bilaterally. Tiny bulge superior margin P1 segments left posterior cerebral artery. Question tiny aneurysm (less than 2 mm). A vessel may rise from this region. Electronically Signed   By: Remo Lipps  Jeannine Kitten M.D.   On: 08/12/2015 10:13   Mr Brain Wo Contrast  08/12/2015  CLINICAL DATA:  69 year old diabetic hypertensive female with coronary artery disease presenting with 2 week history of headache. Unwitnessed fall 5 days ago.  Left-sided weakness. Subsequent encounter. EXAM: MRI HEAD WITHOUT CONTRAST MRA HEAD WITHOUT CONTRAST TECHNIQUE: Multiplanar, multiecho pulse sequences of the brain and surrounding structures were obtained without intravenous contrast. Angiographic images of the head were obtained using MRA technique without contrast. COMPARISON:  08/23/2015 head CT.  02/13/2010 brain MR. FINDINGS: MRI HEAD FINDINGS Exam is motion degraded. Numerous bilateral acute/subacute nonhemorrhagic infarcts. Most confluent infarcts have an appearance of acute/subacute infarct involves portions of the right frontal -parietal - occipital and posterior right temporal lobe. Swelling of gyri without significant local mass effect. Smaller acute nonhemorrhagic infarcts scattered throughout the hemispheres bilaterally (frontal lobes, parietal lobes, occipital lobes), right temporal lobe/subinsular region and cerebellum bilaterally. The involvement of multiple vascular distributions raises possibility of embolic disease. Remote small right cerebellar infarct. No intracranial hemorrhage. Mild global atrophy without hydrocephalus. No intracranial mass lesion noted on this unenhanced exam. Mild exophthalmos. Partially empty expanded sella without flattening of the posterior globes as may be seen with pseudotumor. Cervical medullary junction and pineal region unremarkable. MRA HEAD FINDINGS Exam is slightly motion degraded. Mild irregularity and slight narrowing cavernous segment right internal carotid artery where there is a minimal bulge which has an appearance most suggestive of result of atherosclerotic type changes rather than aneurysm. Fetal type contribution to the posterior cerebral arteries bilaterally. No significant stenosis of either carotid terminus or M1 segment of either middle cerebral artery. Middle cerebral artery branch vessel irregularity and narrowing bilaterally. Mild narrowing and irregularity A1 segment right anterior cerebral  artery. Bulge of the distal A1 segment of the right anterior cerebral artery appears to be origin of a vessel rather than saccular aneurysm. Mild to moderate narrowing A2 segment right anterior cerebral artery. Left vertebral artery is dominant. Mild moderate narrowing distal right vertebral artery. Mild to moderate narrowing portions of the posterior inferior cerebellar artery bilaterally. Mild irregularity and minimal narrowing basilar artery without high-grade stenosis. Mild to moderate narrowing portions of the anterior inferior cerebellar artery bilaterally. Narrowing of the superior cerebellar artery more notable on the left. Mild to moderate narrowing portions of the proximal, mid and distal aspect of the posterior cerebral artery bilaterally. Tiny bulge superior margin P1 segments left posterior cerebral artery. Question tiny aneurysm (less than 2 mm). A vessel may rise from this region. IMPRESSION: MRI HEAD Exam is motion degraded. Numerous bilateral acute/subacute nonhemorrhagic infarcts. Most confluent infarcts have an appearance of acute/subacute infarct involves portions of the right frontal -parietal - occipital and posterior right temporal lobe. Swelling of gyri without significant local mass effect. Smaller acute nonhemorrhagic infarcts scattered throughout the hemispheres bilaterally (frontal lobes, parietal lobes, occipital lobes), right temporal lobe/subinsular region and cerebellum bilaterally. The involvement of multiple vascular distributions raises possibility of embolic disease. Remote small right cerebellar infarct. MRA HEAD Exam is slightly motion degraded. Mild irregularity and slight narrowing cavernous segment right internal carotid artery. Small bulge may be related to atherosclerotic type changes rather than aneurysm. No significant stenosis of either carotid terminus or M1 segment of either middle cerebral artery. Middle cerebral artery branch vessel irregularity and narrowing  bilaterally. Mild narrowing and irregularity A1 segment right anterior cerebral artery. Bulge of the distal A1 segment of the right anterior cerebral artery appears to be origin of a vessel rather than saccular aneurysm. Mild  to moderate narrowing A2 segment right anterior cerebral artery. Left vertebral artery is dominant. Mild to moderate narrowing distal right vertebral artery. Mild to moderate narrowing portions of the posterior inferior cerebellar artery bilaterally. Mild to moderate narrowing portions of the anterior inferior cerebellar artery bilaterally. Narrowing of the superior cerebellar artery more notable on the left. Mild to moderate narrowing portions of the proximal, mid and distal aspect of the posterior cerebral artery bilaterally. Tiny bulge superior margin P1 segments left posterior cerebral artery. Question tiny aneurysm (less than 2 mm). A vessel may rise from this region. Electronically Signed   By: Genia Del M.D.   On: 08/12/2015 10:13   US Abdomen Complete  09/03/2015  CLINICAL DATA:  Acute onset of renal failure. Elevated transaminases. Initial encounter. EXAM: ULTRASOUND ABDOMEN COMPLETE COMPARISON:  CT of the abdomen and pelvis performed 11/21/2013 FINDINGS: Gallbladder: Status post cholecystectomy.  No retained stones seen. Common bile duct: Diameter: 0.5 cm, within normal limits in caliber. Liver: No focal lesion identified. Within normal limits in parenchymal echogenicity, though mildly coarsened echotexture is noted. IVC: No abnormality visualized. Pancreas: Visualized portion unremarkable. Spleen: Size and appearance within normal limits. Right Kidney: Length: 15.3 cm. Echogenicity within normal limits. No mass or hydronephrosis visualized. Left Kidney: Length: 14.3 cm. Echogenicity within normal limits. No mass or hydronephrosis visualized. Abdominal aorta: No aneurysm visualized. Scattered calcific atherosclerotic disease is noted along the abdominal aorta. Other findings:  None. IMPRESSION: 1. Status post cholecystectomy. No acute abnormality seen within the abdomen. 2. Kidneys unremarkable in appearance. 3. Scattered calcific atherosclerotic disease along the abdominal aorta. Electronically Signed   By: Garald Balding M.D.   On: 08/14/2015 21:43   Dg Chest Port 1 View  08/19/2015  CLINICAL DATA:  Acute onset of respiratory failure. Initial encounter. EXAM: PORTABLE CHEST 1 VIEW COMPARISON:  Chest radiograph performed earlier today at 8:59 a.m. FINDINGS: The patient's endotracheal tube is seen ending 3 cm above the carina. An enteric tube is noted extending below the diaphragm. A small left pleural effusion is noted, with left basilar airspace opacification. Underlying vascular congestion is noted. This may reflect pneumonia or asymmetric interstitial edema. No pneumothorax is seen. The cardiomediastinal silhouette is borderline enlarged. No acute osseous abnormalities are identified. IMPRESSION: 1. Endotracheal tube seen ending 3 cm above the carina. 2. Small left pleural effusion, with left basilar airspace opacification. Underlying vascular congestion and borderline cardiomegaly. This may reflect pneumonia or asymmetric interstitial edema. Electronically Signed   By: Garald Balding M.D.   On: 08/21/2015 19:51   Dg Shoulder Left Port  08/12/2015  CLINICAL DATA:  69 year old female with fall and left shoulder bruising. EXAM: LEFT SHOULDER - 1 VIEW COMPARISON:  None. FINDINGS: No fracture or dislocation. There is diffuse soft tissue swelling of the left shoulder. IMPRESSION: No fracture or dislocation. Electronically Signed   By: Anner Crete M.D.   On: 08/12/2015 01:04       STUDIES:  12/03 CT head [facility]) >> atrophy 12/04 CT head >> acute Rt parietal occipital infarct, Rt cerebellar infarct 12/04 U/S Abd >> unremarkable appearance of kidneys 12/05 MRI/MRA brain >> numerous b/l infarcts concerning for embolic infarcts  CULTURES: 12/03 Blood ([place])  >> 12/03 Urine ([place]) >> 12/04 Blood >> 12/04 Respiratory viral panel >> 12/04 Legionella Ag >> 12/04 Pneumococcal Ag >>  ANTIBIOTICS: 12/03 Vancomycin >> 12/03 Zosyn >>  SIGNIFICANT EVENTS: 12/03 Admit to OSH w/ Seizure & Fall 12/04 Transfer to [facility]  LINES/TUBES: 12/03 ETT [facility]) >>  DISCUSSION:  69 yo female presented to [facility] with 2 weeks of HA, and had developed fever up to 102F.  She had fall at home with Rtward gaze and Lt sided weakness.  She is reported to have seizure at [facility].  There is also report of ventricular fibrillation at [facility] >> ?if this was ECG artifact in setting of seizure.  She has hx of DM, HTN, TIA, Hypothyroidism, Neuropathy, Aortic stenosis, CAD, GERD, A fib, Rt foot osteomyelitis.  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 Hx of DM neuropathy. P:   RASS goal -1 AED's per neurology F/u LP 12/05 F/u EEG 12/05 Hold outpt cymbalta  PULMONARY A: Acute hypoxic respiratory failure and concern for pneumonia >> might have had viral prodrome. P:   Change to pressure support 12/05  F/u CXR, ABG  CARDIOVASCULAR A:  Hx of A fib >> not on anticoagulation as outpt. Hx of HTN, CAD, HLD, mild aortic stenosis. Septic shock >> weaned off pressors 12/04. ? Of VF at outside hospital >> might have been ECG artifact in setting of seizure. Elevated troponin >> demand ischemia. P:  F/u Echo Monitor hemodynamics Defer cardiology assessment for now Hold outpt norvasc, lipitor, lasix, cozaar  RENAL A:   AKI with oliguria >> baseline creatinine 0.8 from August 2014. Non gap metabolic acidosis. Hypomagnesemia. P:   HCO3 in IV fluid F/u BMET, ABG, electrolytes Check FeNa Might need nephrology assessment if renal fx gets worse  GASTROINTESTINAL A:   Elevated LFT's in setting of hypotension Nutrition. P:   Tube feeds Protonix for SUP F/u  LFTs  HEMATOLOGIC A:   Anemia of critical illness and chronic disease. P:  F/u CBC SCD's  INFECTIOUS A:   Septic shock >> likely from pneumonia with possible viral prodrome.  Less concern for meningitis/encephalitis given CT head and MRI brain findings. Chronic Rt foot osteomyelitis. P:   Day 2 ampicillin, fortaz, vancomycin >> if LP negative, then narrow Abx F/u procalcitonin  ENDOCRINE A:   Hx of DM, hypothyroidism. P:   SSI Hold outpt glimepiride, metformin Continue synthroid  D/w Dr. Erlinda Hong.  CC time 38 minutes.  Chesley Mires, MD [facility] Pulmonary/Critical Care 08/12/2015, 10:45 AM Pager:  [phone] After 3pm call: [phone]

## 2015-08-12 NOTE — Progress Notes (Signed)
RT note: Pt. transported to MRI from 3M08 for repeat, RT to monitor.

## 2015-08-12 NOTE — Progress Notes (Signed)
VASCULAR LAB PRELIMINARY  PRELIMINARY  PRELIMINARY  PRELIMINARY  Carotid duplex completed.    Preliminary report:  Bilateral:  1-39% ICA stenosis.  Vertebral artery flow is antegrade.     Desera Graffeo, RVS 08/12/2015, 3:01 PM  WOOD,TAMI, RVT 08/12/2015, 3:01 PM

## 2015-08-12 NOTE — Progress Notes (Signed)
Initial Nutrition Assessment  DOCUMENTATION CODES:   Obesity unspecified  INTERVENTION:   Initiate Vital High Protein @ 25 ml/hr via OG tube and increase by 10 ml every 4 hours to goal rate of 45 ml/hr.   30 ml Prostat BID.    Tube feeding regimen provides 1280 kcal, 124 grams of protein, and 902 ml of H2O.  TF regimen and propofol at current rate providing 1636 total kcal/day   NUTRITION DIAGNOSIS:   Inadequate oral intake related to inability to eat as evidenced by NPO status.  GOAL:   Provide needs based on ASPEN/SCCM guidelines  MONITOR:   Vent status, Labs, Weight trends, TF tolerance  REASON FOR ASSESSMENT:   Consult Enteral/tube feeding initiation and management  ASSESSMENT:   Pt with hx of right foot osteomyelitis with toe amputation. Admitted after 2 weeks of HA with fever. Pt with acute encephalopathy with status epilepticus likely from acute embolic infarcts.   Patient is currently intubated on ventilator support MV: 8.9 L/min Temp (24hrs), Avg:98.4 F (36.9 C), Min:98 F (36.7 C), Max:98.7 F (37.1 C)  Propofol: 13.6 ml/hr provides: 359 kcal per day from lipid Labs reviewed: Phosphorus elevated 7.1, Magnesium low 1.4 Pt discussed during ICU rounds and with RN.  Nutrition-Focused physical exam completed. Findings are no fat depletion, no muscle depletion, and no edema.    Diet Order:  Diet NPO time specified  Skin:  Reviewed, no issues  Last BM:  12/4  Height:   Ht Readings from Last 1 Encounters:  08/25/2015 5\' 6"  (1.676 m)   Weight:   Wt Readings from Last 1 Encounters:  08/12/15 252 lb 10.4 oz (114.6 kg)   Ideal Body Weight:  59 kg  BMI:  Body mass index is 40.8 kg/(m^2).  Estimated Nutritional Needs:   Kcal:  [id number]  Protein:  >/= 118 grams  Fluid:  > 1.5 L/day  EDUCATION NEEDS:   No education needs identified at this time  [place], [place], [place] Pager [phone] After Hours Pager

## 2015-08-12 NOTE — Procedures (Signed)
ELECTROENCEPHALOGRAM REPORT  Patient: Melissa Becker       Room #: [id number] EEG No. ID: [id number] Age: 69 y.o.        Sex: female Referring Physician: Calton Dach Report Date:  08/12/2015        Interpreting Physician: Anthony Sar  History: Melissa Becker is an 69 y.o. female with a history of diabetes mellitus, hypertension, heart disease, atrial fibrillation and TIAs presenting with sepsis, seizures and multiple bilateral small cerebral acute and subacute infarctions.  Indications for study:  Assess severity of encephalopathy; rule out seizure activity.  Technique: This is an 18 channel routine scalp EEG performed at the bedside with bipolar and monopolar montages arranged in accordance to the international 10/20 system of electrode placement.   Description: Patient was intubated and on mechanical ventilation, as well as sedated at the time of this EEG recording. She did not react to noxious stimuli. Predominant background activity consisted of low to moderate amplitude continuous generalized slowing of cerebral activity with irregular delta and theta activity recorded, predominantly slower delta activity, however. Photic stimulation was not performed. No epileptiform discharges were recorded.  Interpretation: This EEG is abnormal with severe generalized continuous nonspecific slowing cerebral activity. This pattern of slowing can be seen with metabolic and toxic encephalopathies, as well as with severe degenerative central nervous system disorders. No evidence of epileptiform activity was recorded.   Rush Farmer M.D. [facility] [phone]

## 2015-08-12 NOTE — Procedures (Signed)
Final Report  CLINICAL DATA: Acute and subacute stroke. Possible meningitis. Failed lumbar puncture on the floor.  EXAM: DIAGNOSTIC LUMBAR PUNCTURE UNDER FLUOROSCOPIC GUIDANCE  FLUOROSCOPY TIME: Radiation Exposure Index (as provided by the fluoroscopic device): 332 microGy*m^2  PROCEDURE: I discussed the risks (including hemorrhage, infection, headache, and nerve damage, among others), benefits, and alternatives to fluoroscopically guided lumbar puncture with the patient's husband by telephone consent. The patient is intubated and not able to provide consent herself. We specifically discussed the technical likelihood of success of the procedure. The patient's husband understood and elected for the patient to undergo the procedure.  Standard time-out was employed. Respiratory therapy and assist personnel were present. The patient was turned into the prone position with careful attention to maintaining the endotracheal tube.  Following sterile skin prep and local anesthetic administration consisting of 1 percent lidocaine, a 22 gauge 5 inch spinal needle was advanced into the thecal sac at the at the L3-4 level. Frankly bloody fluid was returned. Opening pressure was not obtained due to the difficulty in turning the patient.  3 cc of bloody CSF was collected. This did not clear. There was observed clotting. Because of plugging of the needle and tubing, I removed the needle and advanced a second needle at L3-4 into the spinal canal. Once again I felt the loss of resistance of the epidural space and pop of presumed dural penetration. Bloody fluid flowed under low pressure reminiscent of CSF pressure. I obtained another 4 cc of bloody fluid.  The needle was subsequently removed and the skin cleansed and bandaged. No immediate complications were observed.  IMPRESSION: 1. With needle positioning in the spinal canal, bloody fluid was returned and at least the first tube seemed to  clot, and the bloody appearance did not clear. This would be atypical for CSF blood from subarachnoid hemorrhage, which will typically not clot. I speculate that there may be dilated epidural venous plexus which was sampled rather than actual CSF (unsuccessful lumbar puncture). Attempt at new positioning with a second needle (also at the L3-4 level) yielded the same result. Needle positioning was straightforward, well visualized, and relatively atraumatic with both advancements. Given the reduced ability to assess for neurologic signs of epidural hematoma due to the ventilation and clinical condition, a low threshold for lumbar MRI should be considered. Lumbar MRI would also allow [place] to assess the best location for lumbar puncture, for example if the patient has prominent epidural adipose tissue.  I discussed these impressions by telephone with Dr. Erlinda Hong at 12:55 PM on 06/12/2015.

## 2015-08-13 ENCOUNTER — Inpatient Hospital Stay (HOSPITAL_COMMUNITY): Payer: Medicare Other

## 2015-08-13 DIAGNOSIS — I639 Cerebral infarction, unspecified: Secondary | ICD-10-CM | POA: Insufficient documentation

## 2015-08-13 DIAGNOSIS — J9601 Acute respiratory failure with hypoxia: Secondary | ICD-10-CM

## 2015-08-13 DIAGNOSIS — I6349 Cerebral infarction due to embolism of other cerebral artery: Secondary | ICD-10-CM

## 2015-08-13 DIAGNOSIS — N179 Acute kidney failure, unspecified: Secondary | ICD-10-CM | POA: Insufficient documentation

## 2015-08-13 DIAGNOSIS — I480 Paroxysmal atrial fibrillation: Secondary | ICD-10-CM | POA: Insufficient documentation

## 2015-08-13 LAB — BLOOD GAS, ARTERIAL
Acid-base deficit: 3.7 mmol/L — ABNORMAL HIGH (ref 0.0–2.0)
BICARBONATE: 20.8 meq/L (ref 20.0–24.0)
DRAWN BY: 100061
FIO2: 0.4
MECHVT: 550 mL
O2 Saturation: 97.7 %
PATIENT TEMPERATURE: 98.6
PCO2 ART: 37.1 mmHg (ref 35.0–45.0)
PEEP: 5 cmH2O
PO2 ART: 111 mmHg — AB (ref 80.0–100.0)
RATE: 16 resp/min
TCO2: 21.9 mmol/L (ref 0–100)
pH, Arterial: 7.367 (ref 7.350–7.450)

## 2015-08-13 LAB — GLUCOSE, CAPILLARY
GLUCOSE-CAPILLARY: 158 mg/dL — AB (ref 65–99)
GLUCOSE-CAPILLARY: 167 mg/dL — AB (ref 65–99)
GLUCOSE-CAPILLARY: 207 mg/dL — AB (ref 65–99)
GLUCOSE-CAPILLARY: 181 mg/dL — AB (ref 65–99)
Glucose-Capillary: 127 mg/dL — ABNORMAL HIGH (ref 65–99)
Glucose-Capillary: 168 mg/dL — ABNORMAL HIGH (ref 65–99)
Glucose-Capillary: 189 mg/dL — ABNORMAL HIGH (ref 65–99)

## 2015-08-13 LAB — URINALYSIS, ROUTINE W REFLEX MICROSCOPIC
Bilirubin Urine: NEGATIVE
GLUCOSE, UA: NEGATIVE mg/dL
Ketones, ur: NEGATIVE mg/dL
Nitrite: NEGATIVE
PH: 5 (ref 5.0–8.0)
Protein, ur: 100 mg/dL — AB
SPECIFIC GRAVITY, URINE: 1.013 (ref 1.005–1.030)

## 2015-08-13 LAB — RESPIRATORY VIRUS PANEL
Adenovirus: NEGATIVE
Influenza A: NEGATIVE
Influenza B: NEGATIVE
Metapneumovirus: NEGATIVE
Parainfluenza 1: NEGATIVE
Parainfluenza 2: NEGATIVE
Parainfluenza 3: NEGATIVE
Respiratory Syncytial Virus A: NEGATIVE
Respiratory Syncytial Virus B: NEGATIVE
Rhinovirus: NEGATIVE

## 2015-08-13 LAB — COMPREHENSIVE METABOLIC PANEL
ALBUMIN: 2.4 g/dL — AB (ref 3.5–5.0)
ALK PHOS: 54 U/L (ref 38–126)
ALT: 52 U/L (ref 14–54)
AST: 26 U/L (ref 15–41)
Anion gap: 13 (ref 5–15)
BILIRUBIN TOTAL: 0.5 mg/dL (ref 0.3–1.2)
BUN: 60 mg/dL — ABNORMAL HIGH (ref 6–20)
CALCIUM: 7.1 mg/dL — AB (ref 8.9–10.3)
CO2: 22 mmol/L (ref 22–32)
CREATININE: 5.44 mg/dL — AB (ref 0.44–1.00)
Chloride: 108 mmol/L (ref 101–111)
GFR calc Af Amer: 8 mL/min — ABNORMAL LOW (ref >60)
GFR calc non Af Amer: 7 mL/min — ABNORMAL LOW (ref >60)
Glucose, Bld: 185 mg/dL — ABNORMAL HIGH (ref 65–99)
POTASSIUM: 4 mmol/L (ref 3.5–5.1)
Sodium: 143 mmol/L (ref 135–145)
Total Protein: 5.8 g/dL — ABNORMAL LOW (ref 6.5–8.1)

## 2015-08-13 LAB — URINE MICROSCOPIC-ADD ON

## 2015-08-13 LAB — CBC
HEMATOCRIT: 30.4 % — AB (ref 36.0–46.0)
Hemoglobin: 9.1 g/dL — ABNORMAL LOW (ref 12.0–15.0)
MCH: 26.9 pg (ref 26.0–34.0)
MCHC: 29.9 g/dL — AB (ref 30.0–36.0)
MCV: 89.9 fL (ref 78.0–100.0)
Platelets: 163 10*3/uL (ref 150–400)
RBC: 3.38 MIL/uL — ABNORMAL LOW (ref 3.87–5.11)
RDW: 15.4 % (ref 11.5–15.5)
WBC: 9.8 10*3/uL (ref 4.0–10.5)

## 2015-08-13 LAB — LIPID PANEL
CHOLESTEROL: 127 mg/dL (ref 0–200)
HDL: 28 mg/dL — ABNORMAL LOW (ref >40)
LDL CALC: 60 mg/dL (ref 0–99)
TRIGLYCERIDES: 195 mg/dL — AB (ref <150)
Total CHOL/HDL Ratio: 4.5 RATIO
VLDL: 39 mg/dL (ref 0–40)

## 2015-08-13 LAB — PHENYTOIN LEVEL, FREE AND TOTAL
Phenytoin, Free: NOT DETECTED ug/mL (ref 1.0–2.0)
Phenytoin, Total: 3.2 ug/mL — ABNORMAL LOW (ref 10.0–20.0)

## 2015-08-13 LAB — URINE CULTURE: Special Requests: NORMAL

## 2015-08-13 LAB — HEMOGLOBIN A1C
HEMOGLOBIN A1C: 7.4 % — AB (ref 4.8–5.6)
MEAN PLASMA GLUCOSE: 166 mg/dL

## 2015-08-13 LAB — CK: Total CK: 134 U/L (ref 38–234)

## 2015-08-13 LAB — PROCALCITONIN: Procalcitonin: 12.6 ng/mL

## 2015-08-13 LAB — MAGNESIUM: MAGNESIUM: 1.5 mg/dL — AB (ref 1.7–2.4)

## 2015-08-13 LAB — PROTEIN / CREATININE RATIO, URINE
Creatinine, Urine: 68.55 mg/dL
PROTEIN CREATININE RATIO: 1.66 mg/mg{creat} — AB (ref 0.00–0.15)
Total Protein, Urine: 114 mg/dL

## 2015-08-13 LAB — PHENYTOIN LEVEL, TOTAL: PHENYTOIN LVL: 3.9 ug/mL — AB (ref 10.0–20.0)

## 2015-08-13 LAB — TRIGLYCERIDES: TRIGLYCERIDES: 191 mg/dL — AB (ref <150)

## 2015-08-13 LAB — PHOSPHORUS: PHOSPHORUS: 7.6 mg/dL — AB (ref 2.5–4.6)

## 2015-08-13 MED ORDER — ATORVASTATIN CALCIUM 80 MG PO TABS
80.0000 mg | ORAL_TABLET | Freq: Every evening | ORAL | Status: DC
  Administered 2015-08-13 – 2015-08-20 (×8): 80 mg
  Filled 2015-08-13 (×9): qty 1

## 2015-08-13 MED ORDER — FLUCONAZOLE IN SODIUM CHLORIDE 100-0.9 MG/50ML-% IV SOLN: 100.0000 mg | INTRAVENOUS | Status: DC

## 2015-08-13 MED ORDER — SODIUM CHLORIDE 0.9 % IV SOLN
250.0000 mL | INTRAVENOUS | Status: DC
  Administered 2015-08-13 (×2): 1000 mL via INTRAVENOUS

## 2015-08-13 MED ORDER — AMLODIPINE BESYLATE 10 MG PO TABS
10.0000 mg | ORAL_TABLET | Freq: Every morning | ORAL | Status: DC
  Administered 2015-08-13 – 2015-08-21 (×9): 10 mg
  Filled 2015-08-13 (×10): qty 1

## 2015-08-13 MED ORDER — FENTANYL CITRATE (PF) 100 MCG/2ML IJ SOLN
50.0000 ug | INTRAMUSCULAR | Status: DC | PRN
  Administered 2015-08-15 – 2015-08-18 (×7): 50 ug via INTRAVENOUS
  Filled 2015-08-13 (×8): qty 2

## 2015-08-13 MED ORDER — FUROSEMIDE 10 MG/ML IJ SOLN
160.0000 mg | Freq: Four times a day (QID) | INTRAVENOUS | Status: AC
  Administered 2015-08-13 (×2): 160 mg via INTRAVENOUS
  Filled 2015-08-13 (×2): qty 16

## 2015-08-13 MED ORDER — MAGNESIUM SULFATE IN D5W 10-5 MG/ML-% IV SOLN
1.0000 g | Freq: Once | INTRAVENOUS | Status: AC
  Administered 2015-08-13: 1 g via INTRAVENOUS
  Filled 2015-08-13: qty 100

## 2015-08-13 MED ORDER — FLUCONAZOLE IN SODIUM CHLORIDE 200-0.9 MG/100ML-% IV SOLN
200.0000 mg | Freq: Once | INTRAVENOUS | Status: DC
  Filled 2015-08-13: qty 100

## 2015-08-13 MED ORDER — BISACODYL 10 MG RE SUPP: 10.0000 mg | Freq: Every day | RECTAL | Status: DC | PRN

## 2015-08-13 MED ORDER — ASPIRIN 325 MG PO TABS
325.0000 mg | ORAL_TABLET | Freq: Every day | ORAL | Status: DC
  Administered 2015-08-13 – 2015-08-21 (×9): 325 mg
  Filled 2015-08-13 (×8): qty 1

## 2015-08-13 MED ORDER — SODIUM CHLORIDE 0.9 % IV SOLN
10.0000 mg/kg | Freq: Once | INTRAVENOUS | Status: AC
  Administered 2015-08-13: 820 mg via INTRAVENOUS
  Filled 2015-08-13: qty 16.4

## 2015-08-13 MED ORDER — PROPOFOL 1000 MG/100ML IV EMUL
0.0000 ug/kg/min | INTRAVENOUS | Status: DC
  Administered 2015-08-13 – 2015-08-14 (×2): 20 ug/kg/min via INTRAVENOUS
  Administered 2015-08-14: 10 ug/kg/min via INTRAVENOUS
  Filled 2015-08-13 (×3): qty 100

## 2015-08-13 MED ORDER — FLUCONAZOLE IN SODIUM CHLORIDE 100-0.9 MG/50ML-% IV SOLN
50.0000 mg | INTRAVENOUS | Status: DC
  Administered 2015-08-14: 50 mg via INTRAVENOUS
  Filled 2015-08-13 (×3): qty 25

## 2015-08-13 MED ORDER — FLUCONAZOLE IN SODIUM CHLORIDE 100-0.9 MG/50ML-% IV SOLN
100.0000 mg | Freq: Once | INTRAVENOUS | Status: AC
  Administered 2015-08-13: 100 mg via INTRAVENOUS
  Filled 2015-08-13: qty 50

## 2015-08-13 MED ORDER — SODIUM CHLORIDE 0.9 % IV SOLN
100.0000 mg | Freq: Three times a day (TID) | INTRAVENOUS | Status: DC
  Administered 2015-08-13: 100 mg via INTRAVENOUS
  Filled 2015-08-13 (×5): qty 2

## 2015-08-13 MED ORDER — SENNOSIDES 8.8 MG/5ML PO SYRP
5.0000 mL | ORAL_SOLUTION | Freq: Two times a day (BID) | ORAL | Status: DC | PRN
  Filled 2015-08-13: qty 5

## 2015-08-13 MED ORDER — AMLODIPINE BESYLATE 10 MG PO TABS: 10.0000 mg | ORAL_TABLET | Freq: Every morning | ORAL | Status: DC

## 2015-08-13 MED ORDER — LABETALOL HCL 5 MG/ML IV SOLN
20.0000 mg | INTRAVENOUS | Status: DC | PRN
  Administered 2015-08-14: 20 mg via INTRAVENOUS
  Filled 2015-08-13 (×4): qty 4

## 2015-08-13 MED ORDER — HEPARIN SODIUM (PORCINE) 5000 UNIT/ML IJ SOLN
5000.0000 [IU] | Freq: Three times a day (TID) | INTRAMUSCULAR | Status: DC
  Administered 2015-08-13 – 2015-08-21 (×24): 5000 [IU] via SUBCUTANEOUS
  Filled 2015-08-13 (×30): qty 1

## 2015-08-13 MED ORDER — ATORVASTATIN CALCIUM 80 MG PO TABS: 80.0000 mg | ORAL_TABLET | Freq: Every evening | ORAL | Status: DC

## 2015-08-13 NOTE — Progress Notes (Signed)
ANTIBIOTIC CONSULT NOTE - INITIAL  Pharmacy Consult for fluconazole Indication: UTI  Allergies  Allergen Reactions  . Sulfamethoxazole-Trimethoprim Other (See Comments)    Headaches  . Latex Itching    whelps  . Tape   Patient Measurements: Height: 5\' 6"  (167.6 cm) Weight: 255 lb 11.7 oz (116 kg) IBW/kg (Calculated) : 59.3 Vital Signs: Temp: 98.6 F (37 C) (12/06 1126) Temp Source: Axillary (12/06 1126) BP: 171/67 mmHg (12/06 1400) Pulse Rate: 87 (12/06 1400) Intake/Output from previous day: 12/05 0701 - 12/06 0700 In: 1835.3 [I.V.:1685.3; IV Piggyback:150] Out: 450 [Urine:300; Emesis/NG output:150] Intake/Output from this shift: Total I/O In: 980.5 [I.V.:481.4; NG/GT:299.2; IV Piggyback:200] Out: 200 [Urine:200] Labs:  Recent Labs  08/19/2015 2010 08/12/15 0015 08/12/15 0635 08/12/15 1438 08/13/15 0238  WBC  --   --  13.5*  --  9.8  HGB  --   --  9.1*  --  9.1*  PLT  --  207 183  --  163  LABCREA  --   --   --  149.04  --   CREATININE 3.91*  --  4.26*  --  5.44*   Estimated Creatinine Clearance: 12.5 mL/min (by C-G formula based on Cr of 5.44).  Recent Labs  08/21/2015 2010  VANCORANDOM 75     Microbiology: Recent Results (from the past 720 hour(s))  MRSA PCR Screening     Status: None   Collection Time: 08/20/2015  6:20 PM  Result Value Ref Range Status   MRSA by PCR NEGATIVE NEGATIVE Final    Comment:        The GeneXpert MRSA Assay (FDA approved for NASAL specimens only), is one component of a comprehensive MRSA colonization surveillance program. It is not intended to diagnose MRSA infection nor to guide or monitor treatment for MRSA infections.   Culture, blood (routine x 2)     Status: None (Preliminary result)   Collection Time: 08/31/2015  7:51 PM  Result Value Ref Range Status   Specimen Description BLOOD BLOOD RIGHT FOREARM  Final   Special Requests BOTTLES DRAWN AEROBIC AND ANAEROBIC 5CC  Final   Culture NO GROWTH 2 DAYS  Final   Report  Status PENDING  Incomplete  Culture, blood (routine x 2)     Status: None (Preliminary result)   Collection Time: 08/28/2015  8:02 PM  Result Value Ref Range Status   Specimen Description BLOOD RIGHT HAND  Final   Special Requests BOTTLES DRAWN AEROBIC ONLY [place]  Final   Culture NO GROWTH 2 DAYS  Final   Report Status PENDING  Incomplete  Culture, Urine     Status: None   Collection Time: 08/31/2015  8:05 PM  Result Value Ref Range Status   Specimen Description URINE, CATHETERIZED  Final   Special Requests Normal  Final   Culture >=100,000 COLONIES/mL YEAST  Final   Report Status 08/13/2015 FINAL  Final  Anaerobic culture     Status: None (Preliminary result)   Collection Time: 08/12/15 12:10 PM  Result Value Ref Range Status   Specimen Description CSF  Final   Special Requests NO2 2CC CSF SPECIMEN IS CLOTTED  Final   Gram Stain   Final    FEW WBC PRESENT, PREDOMINANTLY PMN NO ORGANISMS SEEN    Culture   Final    NO ANAEROBES ISOLATED; CULTURE IN PROGRESS FOR 5 DAYS   Report Status PENDING  Incomplete  CSF culture     Status: None (Preliminary result)   Collection Time: 08/12/15 12:10  PM  Result Value Ref Range Status   Specimen Description CSF  Final   Special Requests NO2 2CC CSF SPECIMEN CLOTTED  Final   Gram Stain   Final    FEW WBC PRESENT, PREDOMINANTLY PMN NO ORGANISMS SEEN    Culture NO GROWTH < 24 HOURS  Final   Report Status PENDING  Incomplete  Fungus Culture with Smear     Status: None (Preliminary result)   Collection Time: 08/12/15 12:10 PM  Result Value Ref Range Status   Specimen Description CSF  Final   Special Requests NONE  Final   Fungal Smear   Final    NO YEAST OR FUNGAL ELEMENTS SEEN Performed at [facility]    Culture   Final    CULTURE IN PROGRESS FOR FOUR WEEKS Performed at [facility]    Report Status PENDING  Incomplete   Assessment: 69 year old female with acute renal failure, sepsis, and yeast in urine to start  fluconazole per Dr. Halford Chessman. No other cultures positive at this time. Patient is currently oliguric. Urethral cath in place since 12/3 - placed at [facility].   Goal of Therapy:  Clinical resolution of infection  Plan:  Fluconazole 100mg  IV x1 then 50mg  IV daily.  Follow-up DOT Follow-up clinical status and ability to narrow.   Sloan Leiter, PharmD, BCPS Clinical Pharmacist [phone] 08/13/2015,3:00 PM

## 2015-08-13 NOTE — Progress Notes (Signed)
CONSULT NOTE - INITIAL  Pharmacy Consult for fosphenytoin Indication: new-onset seizure  Allergies  Allergen Reactions  . Sulfamethoxazole-Trimethoprim Other (See Comments)    Headaches  . Latex Itching    whelps  . Tape     Patient Measurements: Height: 5\' 6"  (167.6 cm) Weight: 255 lb 11.7 oz (116 kg) IBW/kg (Calculated) : 59.3  Vital Signs: Temp: 98.6 F (37 C) (12/06 1126) Temp Source: Axillary (12/06 1126) BP: 162/66 mmHg (12/06 1200) Pulse Rate: 81 (12/06 1200)  Labs:  Recent Labs  08/16/2015 2010 08/12/15 0015 08/12/15 0635 08/12/15 1438 08/13/15 0238  WBC  --   --  13.5*  --  9.8  HGB  --   --  9.1*  --  9.1*  PLT  --  207 183  --  163  LABCREA  --   --   --  149.04  --   CREATININE 3.91*  --  4.26*  --  5.44*   Estimated Creatinine Clearance: 12.5 mL/min (by C-G formula based on Cr of 5.44).  Medical History: Past Medical History  Diagnosis Date  . Hypothyroid   . Type 2 diabetes mellitus ([place])   . Hypertension   . TIA (transient ischemic attack)     history of TIAs  . Neuropathy ([place])   . Ejection fraction   . Aortic stenosis   . Mitral regurgitation   . Latex allergy     Itching  . GI bleed 2014  . CAD (coronary artery disease)   . GERD (gastroesophageal reflux disease)   . Anemia   . Allergy   . Atrial fibrillation ([place])   . Stroke [facility]) 6 yrs  ago    TIA  . Pneumonia 10-15 years ago  . Osteomyelitis ([place])     Right toe     Assessment: 69 yo female was admitted to OSH 12/3 for CVA, had seizure in OSH ED followed by Vfib arrest, was transferred to [facility] neuro ICU for further mgmt. Seizure possibly 2/2 acute cortical infarct vs CNS infection (on ABX). Dilantin load was given at OSH on 12/4. Corrected phenytoin level for hypoalbuminemia is SUBtherapeutic at 6.7. Discussed with MD, will change phenytoin to fosphenytoin and will re-load patient.   Goal of Therapy:  Dilantin level 10-20 mcg/mL   Plan -Fosphenytoin load 820 mg (10 mg/kg  adjusted body weight) x1 then 100 mg IV tid  -Monitor phenytoin level at Css, recommend checking level at the end of the week      Hughes Better, PharmD, BCPS Clinical Pharmacist Pager: [phone] 08/13/2015 12:22 PM

## 2015-08-13 NOTE — Progress Notes (Addendum)
STROKE TEAM PROGRESS NOTE   SUBJECTIVE (INTERVAL HISTORY) Her RN and daughter are at the bedside. She is much more awake than yesterday. Eyes open on voice, but not tracking or following commands. Still intubated. Afebrile and WBC within normal range. MRI lumbar spine ruled out lumbar SDH. Still not moving BUEs but withdraw BLEs on pain.    OBJECTIVE Temp:  [98.4 F (36.9 C)-98.8 F (37.1 C)] 98.6 F (37 C) (12/06 1126) Pulse Rate:  [40-90] 69 (12/06 1500) Cardiac Rhythm:  [-] Normal sinus rhythm (12/06 1200) Resp:  [15-26] 17 (12/06 1500) BP: (119-177)/(43-159) 121/43 mmHg (12/06 1500) SpO2:  [92 %-100 %] 96 % (12/06 1500) FiO2 (%):  [30 %] 30 % (12/06 1525) Weight:  [255 lb 11.7 oz (116 kg)] 255 lb 11.7 oz (116 kg) (12/06 0500)  CBC:   Recent Labs Lab 08/12/15 0635 08/13/15 0238  WBC 13.5* 9.8  HGB 9.1* 9.1*  HCT 30.1* 30.4*  MCV 90.9 89.9  PLT 183 [id number]    Basic Metabolic Panel:   Recent Labs Lab 08/12/15 0635 08/13/15 0238  NA 142 143  K 4.3 4.0  CL 111 108  CO2 16* 22  GLUCOSE 171* 185*  BUN 49* 60*  CREATININE 4.26* 5.44*  CALCIUM 7.0* 7.1*  MG 1.4* 1.5*  PHOS 7.1* 7.6*    Lipid Panel:     Component Value Date/Time   CHOL 127 08/13/2015 0238   TRIG 191* 08/13/2015 0238   TRIG 195* 08/13/2015 0238   HDL 28* 08/13/2015 0238   CHOLHDL 4.5 08/13/2015 0238   VLDL 39 08/13/2015 0238   LDLCALC 60 08/13/2015 0238   HgbA1c:  Lab Results  Component Value Date   HGBA1C 7.4* 08/12/2015   Urine Drug Screen:     Component Value Date/Time   LABOPIA POSITIVE* 08/12/2015 1430   COCAINSCRNUR NONE DETECTED 08/12/2015 1430   LABBENZ POSITIVE* 08/12/2015 1430   AMPHETMU NONE DETECTED 08/12/2015 1430   THCU NONE DETECTED 08/12/2015 1430   LABBARB NONE DETECTED 08/12/2015 1430      IMAGING I have personally reviewed the radiological images below and agree with the radiology interpretations.  CT HEAD 09/07/2015   Acute moderate RIGHT parietal occipital  infarct (watershed versus angular branch of the MCA). Small acute RIGHT cerebellar infarct. Small area RIGHT frontal encephalomalacia may be posttraumatic or ischemic.   CT CERVICAL SPINE 08/17/2015  No acute cervical spine fracture or malalignment.   US Abdomen Complete 08/25/2015  1. Status post cholecystectomy. No acute abnormality seen within the abdomen. 2. Kidneys unremarkable in appearance. 3. Scattered calcific atherosclerotic disease along the abdominal aorta.    Dg Shoulder Left Port 08/12/2015  No fracture or dislocation.    MRI HEAD 08/12/2015  Exam is motion degraded. Numerous bilateral acute/subacute nonhemorrhagic infarcts. Most confluent infarcts have an appearance of acute/subacute infarct involves portions of the right frontal -parietal - occipital and posterior right temporal lobe. Swelling of gyri without significant local mass effect. Smaller acute nonhemorrhagic infarcts scattered throughout the hemispheres bilaterally (frontal lobes, parietal lobes, occipital lobes), right temporal lobe/subinsular region and cerebellum bilaterally. The involvement of multiple vascular distributions raises possibility of embolic disease. Remote small right cerebellar infarct.  MRA HEAD Exam is slightly motion degraded. Mild irregularity and slight narrowing cavernous segment right internal carotid artery. Small bulge may be related to atherosclerotic type changes rather than aneurysm. No significant stenosis of either carotid terminus or M1 segment of either middle cerebral artery. Middle cerebral artery branch vessel irregularity and narrowing bilaterally.  Mild narrowing and irregularity A1 segment right anterior cerebral artery. Bulge of the distal A1 segment of the right anterior cerebral artery appears to be origin of a vessel rather than saccular aneurysm. Mild to moderate narrowing A2 segment right anterior cerebral artery. Left vertebral artery is dominant. Mild to moderate narrowing distal  right vertebral artery. Mild to moderate narrowing portions of the posterior inferior cerebellar artery bilaterally. Mild to moderate narrowing portions of the anterior inferior cerebellar artery bilaterally. Narrowing of the superior cerebellar artery more notable on the left. Mild to moderate narrowing portions of the proximal, mid and distal aspect of the posterior cerebral artery bilaterally. Tiny bulge superior margin P1 segments left posterior cerebral artery. Question tiny aneurysm (less than 2 mm). A vessel may rise from this region.   CUS - Bilateral: 1-39% ICA stenosis. Vertebral artery flow is antegrade.  2D echo - Normal LV size with EF 50%, diffuse hypokinesis. Mildly dilated RV with normal systolic function. Biatrial enlargement. Moderate aortic stenosis. Dilated IVC suggests elevated RV filling pressure. Mild pulmonary hypertension.  Mr Lumbar Spine Wo Contrast  08/12/2015  IMPRESSION: No complications seen following unsuccessful lumbar puncture. Mild multifactorial spinal stenosis at L2-3 and L3-4 could have contributed to the difficulty at at lumbar puncture. Degenerative facet arthropathy at L3-4 and L4-5. Should a lumbar puncture be repeated, greater likelihood of success might be obtained at the L4-5 or L5-S1 level.  Dg Chest Port 1 View  08/13/2015  IMPRESSION: 1. Persistent left lower lobe atelectasis and small left pleural effusion. Stable mild low-grade CHF. The endotracheal tube is in reasonable position. 2. The nasogastric tube tip and proximal port lie in the region of the gastric body below the expected location of the GE junction.   EEG - This EEG is abnormal with severe generalized continuous nonspecific slowing cerebral activity. This pattern of slowing can be seen with metabolic and toxic encephalopathies, as well as with severe degenerative central nervous system disorders. No evidence of epileptiform activity was recorded.  Physical exam  Temp:  [98.4 F  (36.9 C)-98.8 F (37.1 C)] 98.6 F (37 C) (12/06 1126) Pulse Rate:  [40-90] 69 (12/06 1500) Resp:  [15-26] 17 (12/06 1500) BP: (119-177)/(43-159) 121/43 mmHg (12/06 1500) SpO2:  [92 %-100 %] 96 % (12/06 1500) FiO2 (%):  [30 %] 30 % (12/06 1525) Weight:  [255 lb 11.7 oz (116 kg)] 255 lb 11.7 oz (116 kg) (12/06 0500)  General - Well nourished, well developed, intubated and sedated.  Ophthalmologic - Fundi not visualized due to agitation.  Cardiovascular - Regular rhythm, tachycardia.  Neuro - intubated but eyes open but not tracking, not following commands. PERRL, doll's eyes present, b/l corneal weak reflex but R stronger blinking than left, gag present, breathing over the vent, BLEs withdraw on pain stimulation, BLE 0/5 on pain stimulation, DTR 1+ throughout, no babinski bilaterally.   ASSESSMENT/PLAN Ms. Melissa Becker is a 69 y.o. female with history of DM, HTN, CAD, atrial fibrillation, TIA, GI bleed presenting with complaints of a HA who fell at home due to weakness who developed seizures followed by v fib/cardiac arrest at an outlying hospital. She was intubated and transferred to done where she was found to have a R parietal occipital infarct and small R cerebellar infarct. She did not receive IV t-PA due to unknown time last known well.   Stroke:  bilateral anterior and posterior, infra and supratentorial infarcts, embolic secondary to known atrial fibrillation not on AC  Resultant  AMS, seizure  and BUE plegia  MRI  bilateral anterior and posterior, infra and supratentorial infarcts, largest at right temporal lobe at MCA distribution  MRA diffuse intracranial atherosclerosis, ? Tiny L PCA aneurysm  Carotid Doppler  Unremarkable   2D Echo  EF 50%  LDL 60  HgbA1c 7.4  Heparin 5000 units sq tid for VTE prophylaxis Diet NPO time specified  No antithrombotic prior to admission, now on ASA 325mg  for stroke prevention. No AC at this time due to risk of hemorrhagic  transformation in the setting of large infarcts at right temporal lobe. Will consider AC in 5-7 days.   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  On dilantin but level low  Dilantin load x1  EEG no seizure  AKI  CK 134  Cre 3.91 -> 4.26 -> 5.44  Strict I/O  improved urine outpt   Sepsis, concern for meningoencephalitis  procalcitonin 19.06 -> 16.71->12.60  afebrile  WBC 13.5 -> 9.8  On ampicillin, ceftazidime, and vanco for empirical coverage  Not acyclovir candidate due to AKI  MRI lesion within vascular distribution, not consistent with HSV encephalitis   Pt afebrile, WBC normolize, clinical better, will hold off LP for now.  Vent dependent respiratory failure  Intubated  CCM managing  Atrial Fibrillation  Home anticoagulation:  No antithrombotics at home  CHA2DS2-VASc Score = 6, ?2 oral anticoagulation recommended  Age in Years:  72-74   +1   Sex:  Female   +1    Hypertension History: yes   +1     Diabetes Mellitus:  yes   +1   Congestive Heart Failure History:  0  Vascular Disease History:  0     Stroke/TIA/Thromboembolism History: yes   +2  On ASA for now. Hold off AC for 5-7 days to avoid hemorrhagic transformation.    Hypertension  Stable Permissive hypertension (OK if < 220/120) but gradually normalize in 5-7 days  Hyperlipidemia  Home meds:  lipitor 80 resumed in hospital  LDL 60, goal < 70  Continue statin at discharge  Diabetes type II  HgbA1c 7.4, goal < 7.0  Not controlled  SSI  Other Stroke Risk Factors  Advanced age  Former Cigarette smoker, quit smoking 22 years ago   Morbid Obesity, Body mass index is 41.3 kg/(m^2).   Hx stroke/TIA  Coronary artery disease  Aortic stenosis  Other Active Problems  Hypothyroid  Osteomyelitis R foot  Hospital day # 2  This patient is critically ill due to  multifocal emboli stroke, seizure, respiratory failure, sepsis and at significant risk of neurological worsening, death form recurrent stroke, cerebral edema, brain herniation, heart failure, septic shock. This patient's care requires constant monitoring of vital signs, hemodynamics, respiratory and cardiac monitoring, review of multiple databases, neurological assessment, discussion with family, other specialists and medical decision making of high complexity. I spent 45 minutes of neurocritical care time in the care of this patient.  Rosalin Hawking, MD PhD Stroke Neurology 08/13/2015 3:38 PM    To contact Stroke Continuity provider, please refer to [email or web address] After hours, contact General Neurology

## 2015-08-13 NOTE — Progress Notes (Signed)
Pt having frequent loose stools. MD notified. Orders given for rectal tube.

## 2015-08-13 NOTE — Progress Notes (Signed)
Pts BP has been greater than 160 since 0300. Heart rate 86 at this time but made MD McQuaid aware that pts heart rate occasionally decreases to mid 40's and 50's. Orders received for Labetalol PRN for BP <170

## 2015-08-13 NOTE — Consult Note (Addendum)
Requesting Physician:  Dr. Halford Chessman Reason for Consult:  Oliguric AKI HPI: The patient is a 69 y.o. year-old 69 yo female with background hx of DM, HTN, TIA, hypothyroidism, neuropathy, aortic stenosis, CAD, GERD, A fib, Rt foot osteomyelitis.  She was admitted to [facility] after presenting with HA, recent fevers to 102, fall at home, possible sz, acute  encephalopathy with findings of acute bilateral embolic infarcts on imaging studies. Had VF arrest requiring defib X 3. Developed septic shock (felt 2/2 PNA vs viral syndrome) requiring pressors, VDRF. She was transferred here for further management.     We are asked to see b/o AKI. Creatinine on admission to [facility] was 0.75 on 08/10/15, up to 2.7 on 12/4 day of transfer->3.9 on arrival here 12/4 and has continued to rise - up to 5.44 today with minimal UOP. She was on benazepril prior to admission. Received  CT scans at [facility] on 12/3  but I don't believe any contrast was administered. UA there showed >300 protein, large blood.  US shows large kidneys (c/w DM)   Creatinine summary for reference: CREATININE, SER  Date/Time Value Ref Range Status  08/13/2015 02:38 AM 5.44* 0.44 - 1.00 mg/dL Final  08/12/2015 06:35 AM 4.26* 0.44 - 1.00 mg/dL Final  08/23/2015 08:10 PM 3.91* 0.44 - 1.00 mg/dL Final  08/15/2015  2.7    [place]    08/10/15 0.75  [place]    04/17/2013 05:21 AM 0.83 0.50 - 1.10 mg/dL Final  04/16/2013 04:00 AM 0.76 0.50 - 1.10 mg/dL Final    Past Medical History  Diagnosis Date  . Hypothyroid   . Type 2 diabetes mellitus ([place])   . Hypertension   . TIA (transient ischemic attack)     history of TIAs  . Neuropathy ([place])   . Ejection fraction   . Aortic stenosis   . Mitral regurgitation   . Latex allergy     Itching  . GI bleed 2014  . CAD (coronary artery disease)   . GERD (gastroesophageal reflux disease)   . Anemia   . Allergy   . Atrial fibrillation ([place])   . Stroke [facility]) 6 yrs  ago    TIA  . Pneumonia  10-15 years ago  . Osteomyelitis ([place])     Right toe     Past Surgical History  Procedure Laterality Date  . Tonsillectomy    . Cholecystectomy    . Ventral hernia repair    . Esophagogastroduodenoscopy N/A 04/15/2013    Procedure: ESOPHAGOGASTRODUODENOSCOPY (EGD);  Surgeon: Wonda Horner, MD;  Location: [facility] ENDOSCOPY;  Service: Endoscopy;  Laterality: N/A;  . Givens capsule study N/A 04/18/2013    Procedure: GIVENS CAPSULE STUDY;  Surgeon: Wonda Horner, MD;  Location: [facility] ENDOSCOPY;  Service: Endoscopy;  Laterality: N/A;  . Tonsillectomy    . Hernia repair    . Right finger surgery  1982    amputation of right ring finger  . Right toe surgery      right toe surgery-next to big toe amputated  . Colonoscopy with propofol N/A 12/22/2013    Procedure: COLONOSCOPY WITH PROPOFOL;  Surgeon: Winfield Cunas., MD;  Location: [facility] ENDOSCOPY;  Service: Endoscopy;  Laterality: N/A;  . Left heart catheterization with coronary angiogram N/A 04/17/2013    Procedure: LEFT HEART CATHETERIZATION WITH CORONARY ANGIOGRAM;  Surgeon: Peter M Martinique, MD;  Location: [facility] CATH LAB;  Service: Cardiovascular;  Laterality: N/A;  . Abdominal hysterectomy      partial  .  Esophagogastroduodenoscopy (egd) with propofol N/A 10/18/2014    Procedure: ESOPHAGOGASTRODUODENOSCOPY (EGD) WITH PROPOFOL;  Surgeon: Winfield Cunas., MD;  Location: [facility] ENDOSCOPY;  Service: Endoscopy;  Laterality: N/A;  . Hot hemostasis N/A 10/18/2014    Procedure: HOT HEMOSTASIS (ARGON PLASMA COAGULATION/BICAP);  Surgeon: Winfield Cunas., MD;  Location: [facility] ENDOSCOPY;  Service: Endoscopy;  Laterality: N/A;     Family History  Problem Relation Age of Onset  . Colon cancer Father   . Cancer Father   . Deep vein thrombosis Father   . Diabetes Father   . Heart disease Father   . Pancreatic cancer Mother   . Heart disease Son   . Varicose Veins Son    Social History:  reports that she quit smoking about 22 years ago. Her smoking use  included Cigarettes. She started smoking about 52 years ago. She has a 16 pack-year smoking history. She has never used smokeless tobacco. She reports that she does not drink alcohol or use illicit drugs.   Allergies  Allergen Reactions  . Sulfamethoxazole-Trimethoprim Other (See Comments)    Headaches  . Latex Itching    whelps  . Tape     Home medications: Prior to Admission medications   Medication Sig Start Date End Date Taking? Authorizing Provider  amLODipine (NORVASC) 10 MG tablet Take 10 mg by mouth every morning.   Yes Historical Provider, MD  atorvastatin (LIPITOR) 80 MG tablet Take 80 mg by mouth every evening.   Yes Historical Provider, MD  doxycycline (VIBRA-TABS) 100 MG tablet Take 100 mg by mouth 2 (two) times daily.   Yes Historical Provider, MD  DULoxetine (CYMBALTA) 60 MG capsule Take 60 mg by mouth every evening.   Yes Historical Provider, MD  furosemide (LASIX) 20 MG tablet TAKE 1 TABLET (20 MG TOTAL) BY MOUTH DAILY. 06/18/15  Yes Carlena Bjornstad, MD  glimepiride (AMARYL) 4 MG tablet Take 4 mg by mouth 2 (two) times daily.    Yes Historical Provider, MD  levothyroxine (SYNTHROID, LEVOTHROID) 100 MCG tablet Take 100 mcg by mouth daily before breakfast.   Yes Historical Provider, MD  losartan (COZAAR) 100 MG tablet Take 100 mg by mouth every morning.   Yes Historical Provider, MD  metFORMIN (GLUCOPHAGE) 1000 MG tablet Take 1,000 mg by mouth 2 (two) times daily with a meal.   Yes Historical Provider, MD  omeprazole (PRILOSEC) 20 MG capsule Take 20 mg by mouth daily.   Yes Historical Provider, MD  Ferrous Sulfate (IRON) 325 (65 FE) MG TABS Take 1 tablet by mouth 3 (three) times daily.  08/30/14   Historical Provider, MD  fluticasone (FLONASE) 50 MCG/ACT nasal spray Place 2 sprays into both nostrils as needed for allergies. prn 04/11/13   Historical Provider, MD  HYDROcodone-acetaminophen (NORCO/VICODIN) 5-325 MG per tablet Take 1 tablet by mouth every 6 (six) hours as needed  for pain. For pain    Historical Provider, MD  pantoprazole (PROTONIX) 40 MG tablet Take 1 tablet (40 mg total) by mouth 2 (two) times daily. Patient not taking: Reported on 08/13/2015 09/19/13   Carlena Bjornstad, MD    Inpatient medications: . amLODipine  10 mg Per Tube q morning - 10a  . ampicillin (OMNIPEN) IV  2 g Intravenous Q12H  . antiseptic oral rinse  7 mL Mouth Rinse 10 times per day  . aspirin  325 mg Per Tube Daily  . atorvastatin  80 mg Per Tube QPM  . cefTAZidime (FORTAZ)  IV  2 g Intravenous Q24H  . chlorhexidine gluconate  15 mL Mouth Rinse BID  . feeding supplement (PRO-STAT SUGAR FREE 64)  30 mL Per Tube BID  . feeding supplement (VITAL HIGH PROTEIN)  1,000 mL Per Tube Q24H  . fosPHENYtoin (CEREBYX) IV  100 mg PE Intravenous TID  . heparin subcutaneous  5,000 Units Subcutaneous 3 times per day  . insulin aspart  0-9 Units Subcutaneous 6 times per day  . levothyroxine  100 mcg Per Tube QAC breakfast  . pantoprazole sodium  40 mg Per Tube Daily  . vancomycin  1,500 mg Intravenous Q48H    Review of Systems Unobtainable (pt intubated and sedated)  Physical Exam:  BP 171/67 mmHg  Pulse 87  Temp(Src) 98.6 F (37 C) (Axillary)  Resp 23  Ht 5\' 6"  (1.676 m)  Wt 116 kg (255 lb 11.7 oz)  BMI 41.30 kg/m2  SpO2 97%  Gen: Intubated, pale WF Sedated Skin: Bruising noted, ecchymoses arms  Neck: Cannot see neck veins Chest: Crackles bases/ant fairly clear Heart: Irreg S1S2 No S3 1/7 murmur USB No diastolic murmur Abdomen: soft, obese, no masses felt + BS Ext: Pitting edema of both arms,  Heme/Lymph: no bruising or LAN   Labs:   Recent Labs Lab 08/10/2015 2010 08/12/15 0635 08/13/15 0238  NA 142 142 143  K 4.3 4.3 4.0  CL 109 111 108  CO2 20* 16* 22  GLUCOSE 187* 171* 185*  BUN 45* 49* 60*  CREATININE 3.91* 4.26* 5.44*  CALCIUM 7.3* 7.0* 7.1*  PHOS 7.4* 7.1* 7.6*     Recent Labs Lab 08/30/2015 2010 08/13/15 0238  AST 51* 26  ALT 78* 52  ALKPHOS 60 54   BILITOT 0.5 0.5  PROT 6.2* 5.8*  ALBUMIN 2.7* 2.4*    Recent Labs Lab 09/02/2015 2010  LIPASE 36  AMYLASE 54   CBC:  Recent Labs Lab 08/12/15 0015 08/12/15 0635 08/13/15 0238  WBC  --  13.5* 9.8  HGB  --  9.1* 9.1*  HCT  --  30.1* 30.4*  MCV  --  90.9 89.9  PLT 207 183 163     Recent Labs Lab 09/07/2015 2010 08/12/15 0055 08/12/15 0635 08/13/15 0238  CKTOTAL  --   --   --  134  TROPONINI 4.62* 3.89* 2.48*  --    :  Recent Labs Lab 08/12/15 1951 08/12/15 2333 08/13/15 0346 08/13/15 0814 08/13/15 1126  GLUCAP 140* 168* 158* 167* 207*   Results for ZELIA, TANNAHILL (MRN [id number]) as of 08/13/2015 14:15  Ref. Range 08/13/2015 20:10 08/12/2015 06:35 08/13/2015 02:38  Procalcitonin Latest Units: ng/mL 19.06 16.71 12.60    Xrays/Other Studies: Ct Head Wo Contrast  08/29/2015  CLINICAL DATA:  Unwitnessed fall in bathroom 4 days ago, found down by husband. LEFT-sided weakness. Severe headache. Subsequent seizures. History of diabetes, hypertension. EXAM: CT HEAD WITHOUT CONTRAST CT CERVICAL SPINE WITHOUT CONTRAST TECHNIQUE: Multidetector CT imaging of the head and cervical spine was performed following the standard protocol without intravenous contrast. Multiplanar CT image reconstructions of the cervical spine were also generated. COMPARISON:  CT head August 10, 2015 FINDINGS: CT HEAD FINDINGS Small wedge-like hypodensity RIGHT cerebellum, not clearly present on prior motion degraded examination. Wedge-like area of RIGHT parietal occipital lobe hypodensity with sulcal effacement, extending to the postcentral gyrus. No midline shift, mass lesions. No intraparenchymal hemorrhage. Small area RIGHT frontal encephalomalacia. Ventricles and sulci are overall normal for patient's age. No abnormal extra-axial fluid collections. Basal cisterns are patent.  Mild calcific atherosclerosis of the carotid siphons. Ocular globes and orbital contents are unremarkable. Life support lines in  place. Paranasal sinuses and mastoid air cells are well aerated. CT CERVICAL SPINE FINDINGS Cervical vertebral bodies and posterior elements are intact and aligned with maintenance of the cervical lordosis. Mild C5-6 disc height loss, minimal uncovertebral hypertrophy compatible with degenerative discs. Moderate to severe LEFT facet arthropathy. No destructive bony lesions. C1-2 articulation maintained. Included prevertebral and paraspinal soft tissues are nonsuspicious; mild calcific atherosclerosis of the carotid bulbs. IMPRESSION: CT HEAD: Acute moderate RIGHT parietal occipital infarct (watershed versus angular branch of the MCA). Small acute RIGHT cerebellar infarct. Small area RIGHT frontal encephalomalacia may be posttraumatic or ischemic. CT CERVICAL SPINE: No acute cervical spine fracture or malalignment. Acute findings discussed with and reconfirmed by Christen Bame, RN Neuro-ICU on 08/27/2015 at 9:38 pm. Electronically Signed   By: Elon Alas M.D.   On: 08/09/2015 21:39   Ct Cervical Spine Wo Contrast  08/29/2015  CLINICAL DATA:  Unwitnessed fall in bathroom 4 days ago, found down by husband. LEFT-sided weakness. Severe headache. Subsequent seizures. History of diabetes, hypertension. EXAM: CT HEAD WITHOUT CONTRAST CT CERVICAL SPINE WITHOUT CONTRAST TECHNIQUE: Multidetector CT imaging of the head and cervical spine was performed following the standard protocol without intravenous contrast. Multiplanar CT image reconstructions of the cervical spine were also generated. COMPARISON:  CT head August 10, 2015 FINDINGS: CT HEAD FINDINGS Small wedge-like hypodensity RIGHT cerebellum, not clearly present on prior motion degraded examination. Wedge-like area of RIGHT parietal occipital lobe hypodensity with sulcal effacement, extending to the postcentral gyrus. No midline shift, mass lesions. No intraparenchymal hemorrhage. Small area RIGHT frontal encephalomalacia. Ventricles and sulci are overall normal for  patient's age. No abnormal extra-axial fluid collections. Basal cisterns are patent. Mild calcific atherosclerosis of the carotid siphons. Ocular globes and orbital contents are unremarkable. Life support lines in place. Paranasal sinuses and mastoid air cells are well aerated. CT CERVICAL SPINE FINDINGS Cervical vertebral bodies and posterior elements are intact and aligned with maintenance of the cervical lordosis. Mild C5-6 disc height loss, minimal uncovertebral hypertrophy compatible with degenerative discs. Moderate to severe LEFT facet arthropathy. No destructive bony lesions. C1-2 articulation maintained. Included prevertebral and paraspinal soft tissues are nonsuspicious; mild calcific atherosclerosis of the carotid bulbs. IMPRESSION: CT HEAD: Acute moderate RIGHT parietal occipital infarct (watershed versus angular branch of the MCA). Small acute RIGHT cerebellar infarct. Small area RIGHT frontal encephalomalacia may be posttraumatic or ischemic. CT CERVICAL SPINE: No acute cervical spine fracture or malalignment. Acute findings discussed with and reconfirmed by Christen Bame, RN Neuro-ICU on 08/12/2015 at 9:38 pm. Electronically Signed   By: Elon Alas M.D.   On: 09/06/2015 21:39   Mr Virgel Paling Wo Contrast  08/12/2015  CLINICAL DATA:  69 year old diabetic hypertensive female with coronary artery disease presenting with 2 week history of headache. Unwitnessed fall 5 days ago. Left-sided weakness. Subsequent encounter. EXAM: MRI HEAD WITHOUT CONTRAST MRA HEAD WITHOUT CONTRAST TECHNIQUE: Multiplanar, multiecho pulse sequences of the brain and surrounding structures were obtained without intravenous contrast. Angiographic images of the head were obtained using MRA technique without contrast. COMPARISON:  08/29/2015 head CT.  02/13/2010 brain MR. FINDINGS: MRI HEAD FINDINGS Exam is motion degraded. Numerous bilateral acute/subacute nonhemorrhagic infarcts. Most confluent infarcts have an appearance of  acute/subacute infarct involves portions of the right frontal -parietal - occipital and posterior right temporal lobe. Swelling of gyri without significant local mass effect. Smaller acute nonhemorrhagic infarcts scattered throughout the hemispheres bilaterally (  frontal lobes, parietal lobes, occipital lobes), right temporal lobe/subinsular region and cerebellum bilaterally. The involvement of multiple vascular distributions raises possibility of embolic disease. Remote small right cerebellar infarct. No intracranial hemorrhage. Mild global atrophy without hydrocephalus. No intracranial mass lesion noted on this unenhanced exam. Mild exophthalmos. Partially empty expanded sella without flattening of the posterior globes as may be seen with pseudotumor. Cervical medullary junction and pineal region unremarkable. MRA HEAD FINDINGS Exam is slightly motion degraded. Mild irregularity and slight narrowing cavernous segment right internal carotid artery where there is a minimal bulge which has an appearance most suggestive of result of atherosclerotic type changes rather than aneurysm. Fetal type contribution to the posterior cerebral arteries bilaterally. No significant stenosis of either carotid terminus or M1 segment of either middle cerebral artery. Middle cerebral artery branch vessel irregularity and narrowing bilaterally. Mild narrowing and irregularity A1 segment right anterior cerebral artery. Bulge of the distal A1 segment of the right anterior cerebral artery appears to be origin of a vessel rather than saccular aneurysm. Mild to moderate narrowing A2 segment right anterior cerebral artery. Left vertebral artery is dominant. Mild moderate narrowing distal right vertebral artery. Mild to moderate narrowing portions of the posterior inferior cerebellar artery bilaterally. Mild irregularity and minimal narrowing basilar artery without high-grade stenosis. Mild to moderate narrowing portions of the anterior  inferior cerebellar artery bilaterally. Narrowing of the superior cerebellar artery more notable on the left. Mild to moderate narrowing portions of the proximal, mid and distal aspect of the posterior cerebral artery bilaterally. Tiny bulge superior margin P1 segments left posterior cerebral artery. Question tiny aneurysm (less than 2 mm). A vessel may rise from this region. IMPRESSION: MRI HEAD Exam is motion degraded. Numerous bilateral acute/subacute nonhemorrhagic infarcts. Most confluent infarcts have an appearance of acute/subacute infarct involves portions of the right frontal -parietal - occipital and posterior right temporal lobe. Swelling of gyri without significant local mass effect. Smaller acute nonhemorrhagic infarcts scattered throughout the hemispheres bilaterally (frontal lobes, parietal lobes, occipital lobes), right temporal lobe/subinsular region and cerebellum bilaterally. The involvement of multiple vascular distributions raises possibility of embolic disease. Remote small right cerebellar infarct. MRA HEAD Exam is slightly motion degraded. Mild irregularity and slight narrowing cavernous segment right internal carotid artery. Small bulge may be related to atherosclerotic type changes rather than aneurysm. No significant stenosis of either carotid terminus or M1 segment of either middle cerebral artery. Middle cerebral artery branch vessel irregularity and narrowing bilaterally. Mild narrowing and irregularity A1 segment right anterior cerebral artery. Bulge of the distal A1 segment of the right anterior cerebral artery appears to be origin of a vessel rather than saccular aneurysm. Mild to moderate narrowing A2 segment right anterior cerebral artery. Left vertebral artery is dominant. Mild to moderate narrowing distal right vertebral artery. Mild to moderate narrowing portions of the posterior inferior cerebellar artery bilaterally. Mild to moderate narrowing portions of the anterior inferior  cerebellar artery bilaterally. Narrowing of the superior cerebellar artery more notable on the left. Mild to moderate narrowing portions of the proximal, mid and distal aspect of the posterior cerebral artery bilaterally. Tiny bulge superior margin P1 segments left posterior cerebral artery. Question tiny aneurysm (less than 2 mm). A vessel may rise from this region. Electronically Signed   By: Genia Del M.D.   On: 08/12/2015 10:13   Mr Brain Wo Contrast  08/12/2015  CLINICAL DATA:  69 year old diabetic hypertensive female with coronary artery disease presenting with 2 week history of headache. Unwitnessed fall 5  days ago. Left-sided weakness. Subsequent encounter. EXAM: MRI HEAD WITHOUT CONTRAST MRA HEAD WITHOUT CONTRAST TECHNIQUE: Multiplanar, multiecho pulse sequences of the brain and surrounding structures were obtained without intravenous contrast. Angiographic images of the head were obtained using MRA technique without contrast. COMPARISON:  08/17/2015 head CT.  02/13/2010 brain MR. FINDINGS: MRI HEAD FINDINGS Exam is motion degraded. Numerous bilateral acute/subacute nonhemorrhagic infarcts. Most confluent infarcts have an appearance of acute/subacute infarct involves portions of the right frontal -parietal - occipital and posterior right temporal lobe. Swelling of gyri without significant local mass effect. Smaller acute nonhemorrhagic infarcts scattered throughout the hemispheres bilaterally (frontal lobes, parietal lobes, occipital lobes), right temporal lobe/subinsular region and cerebellum bilaterally. The involvement of multiple vascular distributions raises possibility of embolic disease. Remote small right cerebellar infarct. No intracranial hemorrhage. Mild global atrophy without hydrocephalus. No intracranial mass lesion noted on this unenhanced exam. Mild exophthalmos. Partially empty expanded sella without flattening of the posterior globes as may be seen with pseudotumor. Cervical  medullary junction and pineal region unremarkable. MRA HEAD FINDINGS Exam is slightly motion degraded. Mild irregularity and slight narrowing cavernous segment right internal carotid artery where there is a minimal bulge which has an appearance most suggestive of result of atherosclerotic type changes rather than aneurysm. Fetal type contribution to the posterior cerebral arteries bilaterally. No significant stenosis of either carotid terminus or M1 segment of either middle cerebral artery. Middle cerebral artery branch vessel irregularity and narrowing bilaterally. Mild narrowing and irregularity A1 segment right anterior cerebral artery. Bulge of the distal A1 segment of the right anterior cerebral artery appears to be origin of a vessel rather than saccular aneurysm. Mild to moderate narrowing A2 segment right anterior cerebral artery. Left vertebral artery is dominant. Mild moderate narrowing distal right vertebral artery. Mild to moderate narrowing portions of the posterior inferior cerebellar artery bilaterally. Mild irregularity and minimal narrowing basilar artery without high-grade stenosis. Mild to moderate narrowing portions of the anterior inferior cerebellar artery bilaterally. Narrowing of the superior cerebellar artery more notable on the left. Mild to moderate narrowing portions of the proximal, mid and distal aspect of the posterior cerebral artery bilaterally. Tiny bulge superior margin P1 segments left posterior cerebral artery. Question tiny aneurysm (less than 2 mm). A vessel may rise from this region. IMPRESSION: MRI HEAD Exam is motion degraded. Numerous bilateral acute/subacute nonhemorrhagic infarcts. Most confluent infarcts have an appearance of acute/subacute infarct involves portions of the right frontal -parietal - occipital and posterior right temporal lobe. Swelling of gyri without significant local mass effect. Smaller acute nonhemorrhagic infarcts scattered throughout the hemispheres  bilaterally (frontal lobes, parietal lobes, occipital lobes), right temporal lobe/subinsular region and cerebellum bilaterally. The involvement of multiple vascular distributions raises possibility of embolic disease. Remote small right cerebellar infarct. MRA HEAD Exam is slightly motion degraded. Mild irregularity and slight narrowing cavernous segment right internal carotid artery. Small bulge may be related to atherosclerotic type changes rather than aneurysm. No significant stenosis of either carotid terminus or M1 segment of either middle cerebral artery. Middle cerebral artery branch vessel irregularity and narrowing bilaterally. Mild narrowing and irregularity A1 segment right anterior cerebral artery. Bulge of the distal A1 segment of the right anterior cerebral artery appears to be origin of a vessel rather than saccular aneurysm. Mild to moderate narrowing A2 segment right anterior cerebral artery. Left vertebral artery is dominant. Mild to moderate narrowing distal right vertebral artery. Mild to moderate narrowing portions of the posterior inferior cerebellar artery bilaterally. Mild to moderate narrowing  portions of the anterior inferior cerebellar artery bilaterally. Narrowing of the superior cerebellar artery more notable on the left. Mild to moderate narrowing portions of the proximal, mid and distal aspect of the posterior cerebral artery bilaterally. Tiny bulge superior margin P1 segments left posterior cerebral artery. Question tiny aneurysm (less than 2 mm). A vessel may rise from this region. Electronically Signed   By: Genia Del M.D.   On: 08/12/2015 10:13   Mr Lumbar Spine Wo Contrast  08/12/2015  CLINICAL DATA:  Diabetes, hypertension and heart disease on ventilator support for sepsis. Seizures. Bilateral cerebral infarctions. Unsuccessful lumbar puncture at the L3-4 level. EXAM: MRI LUMBAR SPINE WITHOUT CONTRAST TECHNIQUE: Multiplanar, multisequence MR imaging of the lumbar spine was  performed. No intravenous contrast was administered. COMPARISON:  Lumbar puncture images same day. CT abdomen 11/21/2013. FINDINGS: The study suffers from motion degradation but is sufficiently diagnostic. No abnormality is seen the lower thoracic region. The distal cord and conus are normal with conus tip at L1. L1-2:  Mild bulging of the disc.  No stenosis or neural compression. L2-3: Moderate bulging of the disc. Mild facet and ligamentous hypertrophy. Mild multifactorial stenosis but no definite neural compression. L3-4: Endplate osteophytes and bulging of the disc. Facet and ligamentous hypertrophy. Mild multifactorial stenosis but no definite neural compression. L4-5: Circumferential bulging of the disc. Bilateral facet degeneration and hypertrophy. Mild stenosis of the lateral recesses but without neural compression. L5-S1:  Bulging of the disc.  Mild facet degeneration.  No stenosis. No complication is seen at the L3-4 level. IMPRESSION: No complications seen following unsuccessful lumbar puncture. Mild multifactorial spinal stenosis at L2-3 and L3-4 could have contributed to the difficulty at at lumbar puncture. Degenerative facet arthropathy at L3-4 and L4-5. Should a lumbar puncture be repeated, greater likelihood of success might be obtained at the L4-5 or L5-S1 level. Electronically Signed   By: Nelson Chimes M.D.   On: 08/12/2015 19:13   US Abdomen Complete  09/03/2015  CLINICAL DATA:  Acute onset of renal failure. Elevated transaminases. Initial encounter. EXAM: ULTRASOUND ABDOMEN COMPLETE COMPARISON:  CT of the abdomen and pelvis performed 11/21/2013 FINDINGS: Gallbladder: Status post cholecystectomy.  No retained stones seen. Common bile duct: Diameter: 0.5 cm, within normal limits in caliber. Liver: No focal lesion identified. Within normal limits in parenchymal echogenicity, though mildly coarsened echotexture is noted. IVC: No abnormality visualized. Pancreas: Visualized portion unremarkable.  Spleen: Size and appearance within normal limits. Right Kidney: Length: 15.3 cm. Echogenicity within normal limits. No mass or hydronephrosis visualized. Left Kidney: Length: 14.3 cm. Echogenicity within normal limits. No mass or hydronephrosis visualized. Abdominal aorta: No aneurysm visualized. Scattered calcific atherosclerotic disease is noted along the abdominal aorta. Other findings: None. IMPRESSION: 1. Status post cholecystectomy. No acute abnormality seen within the abdomen. 2. Kidneys unremarkable in appearance. 3. Scattered calcific atherosclerotic disease along the abdominal aorta. Electronically Signed   By: Garald Balding M.D.   On: 09/02/2015 21:43   Dg Chest Port 1 View  08/13/2015  CLINICAL DATA:  Assess nasogastric tube placement EXAM: PORTABLE CHEST 1 VIEW COMPARISON:  None and PACs FINDINGS: Portable chest x-ray: The lungs are adequately inflated. There is persistent increased density in the left lung base laterally with partial obscuration of the hemidiaphragm. The pulmonary interstitial markings remain increased. The cardiac silhouette remains enlarged and the central pulmonary vascularity is engorged. The endotracheal tube tip lies 3.5 cm above the carina. The esophagogastric tube tip projects below the inferior margin of the image  and is discussed below. Portable supine AP view of the abdomen: The esophagogastric tube tip in proximal per jet port project in the gastric body. They are below the expected location of the GE junction. The bowel gas pattern is unremarkable. IMPRESSION: 1. Persistent left lower lobe atelectasis and small left pleural effusion. Stable mild low-grade CHF. The endotracheal tube is in reasonable position. 2. The nasogastric tube tip and proximal port lie in the region of the gastric body below the expected location of the GE junction. Electronically Signed   By: David  Martinique M.D.   On: 08/13/2015 07:22   Dg Chest Port 1 View  08/25/2015  CLINICAL DATA:  Acute  onset of respiratory failure. Initial encounter. EXAM: PORTABLE CHEST 1 VIEW COMPARISON:  Chest radiograph performed earlier today at 8:59 a.m. FINDINGS: The patient's endotracheal tube is seen ending 3 cm above the carina. An enteric tube is noted extending below the diaphragm. A small left pleural effusion is noted, with left basilar airspace opacification. Underlying vascular congestion is noted. This may reflect pneumonia or asymmetric interstitial edema. No pneumothorax is seen. The cardiomediastinal silhouette is borderline enlarged. No acute osseous abnormalities are identified. IMPRESSION: 1. Endotracheal tube seen ending 3 cm above the carina. 2. Small left pleural effusion, with left basilar airspace opacification. Underlying vascular congestion and borderline cardiomegaly. This may reflect pneumonia or asymmetric interstitial edema. Electronically Signed   By: Garald Balding M.D.   On: 08/17/2015 19:51   Dg Shoulder Left Port  08/12/2015  CLINICAL DATA:  69 year old female with fall and left shoulder bruising. EXAM: LEFT SHOULDER - 1 VIEW COMPARISON:  None. FINDINGS: No fracture or dislocation. There is diffuse soft tissue swelling of the left shoulder. IMPRESSION: No fracture or dislocation. Electronically Signed   By: Anner Crete M.D.   On: 08/12/2015 01:04   Dg Abd Portable 1v  08/13/2015  CLINICAL DATA:  Assess nasogastric tube placement EXAM: PORTABLE CHEST 1 VIEW COMPARISON:  None and PACs FINDINGS: Portable chest x-ray: The lungs are adequately inflated. There is persistent increased density in the left lung base laterally with partial obscuration of the hemidiaphragm. The pulmonary interstitial markings remain increased. The cardiac silhouette remains enlarged and the central pulmonary vascularity is engorged. The endotracheal tube tip lies 3.5 cm above the carina. The esophagogastric tube tip projects below the inferior margin of the image and is discussed below. Portable supine AP  view of the abdomen: The esophagogastric tube tip in proximal per jet port project in the gastric body. They are below the expected location of the GE junction. The bowel gas pattern is unremarkable. IMPRESSION: 1. Persistent left lower lobe atelectasis and small left pleural effusion. Stable mild low-grade CHF. The endotracheal tube is in reasonable position. 2. The nasogastric tube tip and proximal port lie in the region of the gastric body below the expected location of the GE junction. Electronically Signed   By: David  Martinique M.D.   On: 08/13/2015 07:22   Dg Fluoro Guide Lumbar Puncture  08/12/2015  CLINICAL DATA:  Acute and subacute stroke. Possible meningitis. Failed lumbar puncture on the floor. EXAM: DIAGNOSTIC LUMBAR PUNCTURE UNDER FLUOROSCOPIC GUIDANCE FLUOROSCOPY TIME:  Radiation Exposure Index (as provided by the fluoroscopic device): 332 microGy*m^2 PROCEDURE: I discussed the risks (including hemorrhage, infection, headache, and nerve damage, among others), benefits, and alternatives to fluoroscopically guided lumbar puncture with the patient's husband by telephone consent. The patient is intubated and not able to provide consent herself. We specifically discussed the technical likelihood  of success of the procedure. The patient's husband understood and elected for the patient to undergo the procedure. Standard time-out was employed. Respiratory therapy and assist personnel were present. The patient was turned into the prone position with careful attention to maintaining the endotracheal tube. Following sterile skin prep and local anesthetic administration consisting of 1 percent lidocaine, a 22 gauge 5 inch spinal needle was advanced into the thecal sac at the at the L3-4 level. Frankly bloody fluid was returned. Opening pressure was not obtained due to the difficulty in turning the patient. 3 cc of bloody CSF was collected. This did not clear. There was observed clotting. Because of plugging of  the needle and tubing, I removed the needle and advanced a second needle at L3-4 into the spinal canal. Once again I felt the loss of resistance of the epidural space and pop of presumed dural penetration. Bloody fluid flowed under low pressure reminiscent of CSF pressure. I obtained another 4 cc of bloody fluid. The needle was subsequently removed and the skin cleansed and bandaged. No immediate complications were observed. IMPRESSION: 1. With needle positioning in the spinal canal, bloody fluid was returned and at least the first tube seemed to clot, and the bloody appearance did not clear. This would be atypical for CSF blood from subarachnoid hemorrhage, which will typically not clot. I speculate that there may be dilated epidural venous plexus which was sampled rather than actual CSF (unsuccessful lumbar puncture). Attempt at new positioning with a second needle (also at the L3-4 level) yielded the same result. Needle positioning was straightforward, well visualized, and relatively atraumatic with both advancements. Given the reduced ability to assess for neurologic signs of epidural hematoma due to the ventilation and clinical condition, a low threshold for lumbar MRI should be considered. Lumbar MRI would also allow [place] to assess the best location for lumbar puncture, for example if the patient has prominent epidural adipose tissue. I discussed these impressions by telephone with Dr. Erlinda Hong at 12:55 PM on 06/12/2015. Electronically Signed   By: Van Clines M.D.   On: 08/12/2015 13:00   Background: 69 y.o. year-old 69 yo female with background hx of DM, HTN, TIA, hypothyroidism, neuropathy, aortic stenosis, CAD, GERD, A fib, Rt foot osteomyelitis.  She was admitted to [facility] after presenting with HA, recent fevers to 102, fall at home, possible sz, acute  encephalopathy with findings of acute bilateral embolic infarcts on imaging studies. Had VF arrest requiring defib X 3. Developed septic shock  (felt 2/2 PNA vs viral syndrome) requiring pressors, VDRF. She was transferred here for further management.   We are asked to see b/o AKI. Creatinine on admission to [facility] was 0.75 on 08/10/15, up to 2.7 on 12/4 day of transfer->3.9 on arrival here 12/4 and has continued to rise - up to 5.44 today with minimal UOP. She was on benazepril prior to admission. Received  CT scans at [facility] on 12/3  but I don't believe any contrast was administered. UA there showed >300 protein, large blood.  US shows large kidneys (c/w DM)  Impression/Plan 1. AKI - normal renal function at baseline, but UA at outside hospital with proteinuria so likely underlying diabetic nephropathy.  I suspect ATN as proximate cause for her AKI (shock/sepsis/arrest/ACE on board prior to presentation). Of course cannot r/o possibility emboli to her kidneys.  Rate of rise of creatinine quite fast. CK normal so no rhabdo. No obstruction on [place], kidneys large (would be c/w DM). Acidosis corrected  with bicarb drip which is now off. Making some urine. Has not had diuretics since transfer here. Only emerging indication at this time would be volume.   K looks fine. Would favor a trial of high dose diuretics (ordered 2 doses of lasix 160) and if no response then may need catheter for HD (but does not need today). Will need to dose vanco carefully by levels with rising creatinine. Recheck UA and UPC to quantitate proteinuria.  2. Acute hypoxic resp failure/VDRF 3. Sepsis - amp/fortaz/vanco 4. Bilateral cerebral emboli with sz - dilantin 5. DM2 6. AF 7. HTN   Jamal Maes,  MD [facility] [phone] pager 08/13/2015, 2:17 PM

## 2015-08-13 NOTE — Progress Notes (Signed)
PULMONARY / CRITICAL CARE MEDICINE   Name: Melissa Becker MRN: [id number] DOB: 07-29-1946    ADMISSION DATE:  08/27/2015  REFERRING MD:  Anselmo Pickler, D.O. [facility])  CHIEF COMPLAINT:  Sepsis  SUBJECTIVE:  Opens eyes.  Fever curve better.  Not much urine outpt.  VITAL SIGNS: BP 161/68 mmHg  Pulse 72  Temp(Src) 98.4 F (36.9 C) (Axillary)  Resp 21  Ht 5\' 6"  (1.676 m)  Wt 255 lb 11.7 oz (116 kg)  BMI 41.30 kg/m2  SpO2 100%  HEMODYNAMICS:    VENTILATOR SETTINGS: Vent Mode:  [-] PRVC FiO2 (%):  [30 %-40 %] 30 % Set Rate:  [16 bmp] 16 bmp Vt Set:  [530 mL-550 mL] 550 mL PEEP:  [5 cmH20] 5 cmH20 Pressure Support:  [8 cmH20] 8 cmH20 Plateau Pressure:  [16 cmH20-27 cmH20] 20 cmH20  INTAKE / OUTPUT: I/O last 3 completed shifts: In: 3020.9 [I.V.:1770.9; IV Piggyback:1250] Out: 630 [Urine:480; Emesis/NG output:150]  PHYSICAL EXAMINATION: General: RASS -1 Neuro: pupils reactive, opens eyes with stimulation HEENT: ETT in place Cardiac: irregular, no murmur Chest: basilar crackles Abd: soft, non tender Ext: 1+ edema Skin: no rashes  LABS; CBC Recent Labs     08/12/15  0015  08/12/15  0635  08/13/15  0238  WBC   --   13.5*  9.8  HGB   --   9.1*  9.1*  HCT   --   30.1*  30.4*  PLT  207  183  163    Coag's Recent Labs     08/08/2015  2010  APTT  28  INR  1.32    BMET Recent Labs     08/13/2015  2010  08/12/15  0635  08/13/15  0238  NA  142  142  143  K  4.3  4.3  4.0  CL  109  111  108  CO2  20*  16*  22  BUN  45*  49*  60*  CREATININE  3.91*  4.26*  5.44*  GLUCOSE  187*  171*  185*    Electrolytes Recent Labs     08/14/2015  2010  08/12/15  0635  08/13/15  0238  CALCIUM  7.3*  7.0*  7.1*  MG  1.5*  1.4*  1.5*  PHOS  7.4*  7.1*  7.6*    Sepsis Markers Recent Labs     08/23/2015  2010  08/12/15  0635  08/13/15  0238  PROCALCITON  19.06  16.71  12.60    ABG Recent Labs     08/12/15  0055  08/13/15  0405  PHART  7.320*   7.367  PCO2ART  37.7  37.1  PO2ART  94.0  111*    Liver Enzymes Recent Labs     08/13/2015  2010  08/13/15  0238  AST  51*  26  ALT  78*  52  ALKPHOS  60  54  BILITOT  0.5  0.5  ALBUMIN  2.7*  2.4*    Cardiac Enzymes Recent Labs     08/21/2015  2010  08/12/15  0055  08/12/15  0635  TROPONINI  4.62*  3.89*  2.48*    Glucose Recent Labs     08/12/15  0731  08/12/15  1345  08/12/15  1632  08/12/15  1951  08/12/15  2333  08/13/15  0346  GLUCAP  143*  127*  147*  140*  168*  158*    Imaging Ct Head Wo Contrast  08/14/2015  CLINICAL DATA:  Unwitnessed fall in bathroom 4 days ago, found down by husband. LEFT-sided weakness. Severe headache. Subsequent seizures. History of diabetes, hypertension. EXAM: CT HEAD WITHOUT CONTRAST CT CERVICAL SPINE WITHOUT CONTRAST TECHNIQUE: Multidetector CT imaging of the head and cervical spine was performed following the standard protocol without intravenous contrast. Multiplanar CT image reconstructions of the cervical spine were also generated. COMPARISON:  CT head August 10, 2015 FINDINGS: CT HEAD FINDINGS Small wedge-like hypodensity RIGHT cerebellum, not clearly present on prior motion degraded examination. Wedge-like area of RIGHT parietal occipital lobe hypodensity with sulcal effacement, extending to the postcentral gyrus. No midline shift, mass lesions. No intraparenchymal hemorrhage. Small area RIGHT frontal encephalomalacia. Ventricles and sulci are overall normal for patient's age. No abnormal extra-axial fluid collections. Basal cisterns are patent. Mild calcific atherosclerosis of the carotid siphons. Ocular globes and orbital contents are unremarkable. Life support lines in place. Paranasal sinuses and mastoid air cells are well aerated. CT CERVICAL SPINE FINDINGS Cervical vertebral bodies and posterior elements are intact and aligned with maintenance of the cervical lordosis. Mild C5-6 disc height loss, minimal uncovertebral hypertrophy  compatible with degenerative discs. Moderate to severe LEFT facet arthropathy. No destructive bony lesions. C1-2 articulation maintained. Included prevertebral and paraspinal soft tissues are nonsuspicious; mild calcific atherosclerosis of the carotid bulbs. IMPRESSION: CT HEAD: Acute moderate RIGHT parietal occipital infarct (watershed versus angular branch of the MCA). Small acute RIGHT cerebellar infarct. Small area RIGHT frontal encephalomalacia may be posttraumatic or ischemic. CT CERVICAL SPINE: No acute cervical spine fracture or malalignment. Acute findings discussed with and reconfirmed by Christen Bame, RN Neuro-ICU on 08/28/2015 at 9:38 pm. Electronically Signed   By: Elon Alas M.D.   On: 09/02/2015 21:39   Ct Cervical Spine Wo Contrast  08/24/2015  CLINICAL DATA:  Unwitnessed fall in bathroom 4 days ago, found down by husband. LEFT-sided weakness. Severe headache. Subsequent seizures. History of diabetes, hypertension. EXAM: CT HEAD WITHOUT CONTRAST CT CERVICAL SPINE WITHOUT CONTRAST TECHNIQUE: Multidetector CT imaging of the head and cervical spine was performed following the standard protocol without intravenous contrast. Multiplanar CT image reconstructions of the cervical spine were also generated. COMPARISON:  CT head August 10, 2015 FINDINGS: CT HEAD FINDINGS Small wedge-like hypodensity RIGHT cerebellum, not clearly present on prior motion degraded examination. Wedge-like area of RIGHT parietal occipital lobe hypodensity with sulcal effacement, extending to the postcentral gyrus. No midline shift, mass lesions. No intraparenchymal hemorrhage. Small area RIGHT frontal encephalomalacia. Ventricles and sulci are overall normal for patient's age. No abnormal extra-axial fluid collections. Basal cisterns are patent. Mild calcific atherosclerosis of the carotid siphons. Ocular globes and orbital contents are unremarkable. Life support lines in place. Paranasal sinuses and mastoid air cells are well  aerated. CT CERVICAL SPINE FINDINGS Cervical vertebral bodies and posterior elements are intact and aligned with maintenance of the cervical lordosis. Mild C5-6 disc height loss, minimal uncovertebral hypertrophy compatible with degenerative discs. Moderate to severe LEFT facet arthropathy. No destructive bony lesions. C1-2 articulation maintained. Included prevertebral and paraspinal soft tissues are nonsuspicious; mild calcific atherosclerosis of the carotid bulbs. IMPRESSION: CT HEAD: Acute moderate RIGHT parietal occipital infarct (watershed versus angular branch of the MCA). Small acute RIGHT cerebellar infarct. Small area RIGHT frontal encephalomalacia may be posttraumatic or ischemic. CT CERVICAL SPINE: No acute cervical spine fracture or malalignment. Acute findings discussed with and reconfirmed by Christen Bame, RN Neuro-ICU on 08/24/2015 at 9:38 pm. Electronically Signed   By: Elon Alas M.D.   On: 08/08/2015 21:39  Mr Virgel Paling Wo Contrast  08/12/2015  CLINICAL DATA:  69 year old diabetic hypertensive female with coronary artery disease presenting with 2 week history of headache. Unwitnessed fall 5 days ago. Left-sided weakness. Subsequent encounter. EXAM: MRI HEAD WITHOUT CONTRAST MRA HEAD WITHOUT CONTRAST TECHNIQUE: Multiplanar, multiecho pulse sequences of the brain and surrounding structures were obtained without intravenous contrast. Angiographic images of the head were obtained using MRA technique without contrast. COMPARISON:  08/09/2015 head CT.  02/13/2010 brain MR. FINDINGS: MRI HEAD FINDINGS Exam is motion degraded. Numerous bilateral acute/subacute nonhemorrhagic infarcts. Most confluent infarcts have an appearance of acute/subacute infarct involves portions of the right frontal -parietal - occipital and posterior right temporal lobe. Swelling of gyri without significant local mass effect. Smaller acute nonhemorrhagic infarcts scattered throughout the hemispheres bilaterally (frontal lobes,  parietal lobes, occipital lobes), right temporal lobe/subinsular region and cerebellum bilaterally. The involvement of multiple vascular distributions raises possibility of embolic disease. Remote small right cerebellar infarct. No intracranial hemorrhage. Mild global atrophy without hydrocephalus. No intracranial mass lesion noted on this unenhanced exam. Mild exophthalmos. Partially empty expanded sella without flattening of the posterior globes as may be seen with pseudotumor. Cervical medullary junction and pineal region unremarkable. MRA HEAD FINDINGS Exam is slightly motion degraded. Mild irregularity and slight narrowing cavernous segment right internal carotid artery where there is a minimal bulge which has an appearance most suggestive of result of atherosclerotic type changes rather than aneurysm. Fetal type contribution to the posterior cerebral arteries bilaterally. No significant stenosis of either carotid terminus or M1 segment of either middle cerebral artery. Middle cerebral artery branch vessel irregularity and narrowing bilaterally. Mild narrowing and irregularity A1 segment right anterior cerebral artery. Bulge of the distal A1 segment of the right anterior cerebral artery appears to be origin of a vessel rather than saccular aneurysm. Mild to moderate narrowing A2 segment right anterior cerebral artery. Left vertebral artery is dominant. Mild moderate narrowing distal right vertebral artery. Mild to moderate narrowing portions of the posterior inferior cerebellar artery bilaterally. Mild irregularity and minimal narrowing basilar artery without high-grade stenosis. Mild to moderate narrowing portions of the anterior inferior cerebellar artery bilaterally. Narrowing of the superior cerebellar artery more notable on the left. Mild to moderate narrowing portions of the proximal, mid and distal aspect of the posterior cerebral artery bilaterally. Tiny bulge superior margin P1 segments left posterior  cerebral artery. Question tiny aneurysm (less than 2 mm). A vessel may rise from this region. IMPRESSION: MRI HEAD Exam is motion degraded. Numerous bilateral acute/subacute nonhemorrhagic infarcts. Most confluent infarcts have an appearance of acute/subacute infarct involves portions of the right frontal -parietal - occipital and posterior right temporal lobe. Swelling of gyri without significant local mass effect. Smaller acute nonhemorrhagic infarcts scattered throughout the hemispheres bilaterally (frontal lobes, parietal lobes, occipital lobes), right temporal lobe/subinsular region and cerebellum bilaterally. The involvement of multiple vascular distributions raises possibility of embolic disease. Remote small right cerebellar infarct. MRA HEAD Exam is slightly motion degraded. Mild irregularity and slight narrowing cavernous segment right internal carotid artery. Small bulge may be related to atherosclerotic type changes rather than aneurysm. No significant stenosis of either carotid terminus or M1 segment of either middle cerebral artery. Middle cerebral artery branch vessel irregularity and narrowing bilaterally. Mild narrowing and irregularity A1 segment right anterior cerebral artery. Bulge of the distal A1 segment of the right anterior cerebral artery appears to be origin of a vessel rather than saccular aneurysm. Mild to moderate narrowing A2 segment right anterior cerebral artery.  Left vertebral artery is dominant. Mild to moderate narrowing distal right vertebral artery. Mild to moderate narrowing portions of the posterior inferior cerebellar artery bilaterally. Mild to moderate narrowing portions of the anterior inferior cerebellar artery bilaterally. Narrowing of the superior cerebellar artery more notable on the left. Mild to moderate narrowing portions of the proximal, mid and distal aspect of the posterior cerebral artery bilaterally. Tiny bulge superior margin P1 segments left posterior cerebral  artery. Question tiny aneurysm (less than 2 mm). A vessel may rise from this region. Electronically Signed   By: Genia Del M.D.   On: 08/12/2015 10:13   Mr Brain Wo Contrast  08/12/2015  CLINICAL DATA:  69 year old diabetic hypertensive female with coronary artery disease presenting with 2 week history of headache. Unwitnessed fall 5 days ago. Left-sided weakness. Subsequent encounter. EXAM: MRI HEAD WITHOUT CONTRAST MRA HEAD WITHOUT CONTRAST TECHNIQUE: Multiplanar, multiecho pulse sequences of the brain and surrounding structures were obtained without intravenous contrast. Angiographic images of the head were obtained using MRA technique without contrast. COMPARISON:  08/30/2015 head CT.  02/13/2010 brain MR. FINDINGS: MRI HEAD FINDINGS Exam is motion degraded. Numerous bilateral acute/subacute nonhemorrhagic infarcts. Most confluent infarcts have an appearance of acute/subacute infarct involves portions of the right frontal -parietal - occipital and posterior right temporal lobe. Swelling of gyri without significant local mass effect. Smaller acute nonhemorrhagic infarcts scattered throughout the hemispheres bilaterally (frontal lobes, parietal lobes, occipital lobes), right temporal lobe/subinsular region and cerebellum bilaterally. The involvement of multiple vascular distributions raises possibility of embolic disease. Remote small right cerebellar infarct. No intracranial hemorrhage. Mild global atrophy without hydrocephalus. No intracranial mass lesion noted on this unenhanced exam. Mild exophthalmos. Partially empty expanded sella without flattening of the posterior globes as may be seen with pseudotumor. Cervical medullary junction and pineal region unremarkable. MRA HEAD FINDINGS Exam is slightly motion degraded. Mild irregularity and slight narrowing cavernous segment right internal carotid artery where there is a minimal bulge which has an appearance most suggestive of result of atherosclerotic  type changes rather than aneurysm. Fetal type contribution to the posterior cerebral arteries bilaterally. No significant stenosis of either carotid terminus or M1 segment of either middle cerebral artery. Middle cerebral artery branch vessel irregularity and narrowing bilaterally. Mild narrowing and irregularity A1 segment right anterior cerebral artery. Bulge of the distal A1 segment of the right anterior cerebral artery appears to be origin of a vessel rather than saccular aneurysm. Mild to moderate narrowing A2 segment right anterior cerebral artery. Left vertebral artery is dominant. Mild moderate narrowing distal right vertebral artery. Mild to moderate narrowing portions of the posterior inferior cerebellar artery bilaterally. Mild irregularity and minimal narrowing basilar artery without high-grade stenosis. Mild to moderate narrowing portions of the anterior inferior cerebellar artery bilaterally. Narrowing of the superior cerebellar artery more notable on the left. Mild to moderate narrowing portions of the proximal, mid and distal aspect of the posterior cerebral artery bilaterally. Tiny bulge superior margin P1 segments left posterior cerebral artery. Question tiny aneurysm (less than 2 mm). A vessel may rise from this region. IMPRESSION: MRI HEAD Exam is motion degraded. Numerous bilateral acute/subacute nonhemorrhagic infarcts. Most confluent infarcts have an appearance of acute/subacute infarct involves portions of the right frontal -parietal - occipital and posterior right temporal lobe. Swelling of gyri without significant local mass effect. Smaller acute nonhemorrhagic infarcts scattered throughout the hemispheres bilaterally (frontal lobes, parietal lobes, occipital lobes), right temporal lobe/subinsular region and cerebellum bilaterally. The involvement of multiple vascular distributions raises possibility  of embolic disease. Remote small right cerebellar infarct. MRA HEAD Exam is slightly motion  degraded. Mild irregularity and slight narrowing cavernous segment right internal carotid artery. Small bulge may be related to atherosclerotic type changes rather than aneurysm. No significant stenosis of either carotid terminus or M1 segment of either middle cerebral artery. Middle cerebral artery branch vessel irregularity and narrowing bilaterally. Mild narrowing and irregularity A1 segment right anterior cerebral artery. Bulge of the distal A1 segment of the right anterior cerebral artery appears to be origin of a vessel rather than saccular aneurysm. Mild to moderate narrowing A2 segment right anterior cerebral artery. Left vertebral artery is dominant. Mild to moderate narrowing distal right vertebral artery. Mild to moderate narrowing portions of the posterior inferior cerebellar artery bilaterally. Mild to moderate narrowing portions of the anterior inferior cerebellar artery bilaterally. Narrowing of the superior cerebellar artery more notable on the left. Mild to moderate narrowing portions of the proximal, mid and distal aspect of the posterior cerebral artery bilaterally. Tiny bulge superior margin P1 segments left posterior cerebral artery. Question tiny aneurysm (less than 2 mm). A vessel may rise from this region. Electronically Signed   By: Genia Del M.D.   On: 08/12/2015 10:13   Mr Lumbar Spine Wo Contrast  08/12/2015  CLINICAL DATA:  Diabetes, hypertension and heart disease on ventilator support for sepsis. Seizures. Bilateral cerebral infarctions. Unsuccessful lumbar puncture at the L3-4 level. EXAM: MRI LUMBAR SPINE WITHOUT CONTRAST TECHNIQUE: Multiplanar, multisequence MR imaging of the lumbar spine was performed. No intravenous contrast was administered. COMPARISON:  Lumbar puncture images same day. CT abdomen 11/21/2013. FINDINGS: The study suffers from motion degradation but is sufficiently diagnostic. No abnormality is seen the lower thoracic region. The distal cord and conus are  normal with conus tip at L1. L1-2:  Mild bulging of the disc.  No stenosis or neural compression. L2-3: Moderate bulging of the disc. Mild facet and ligamentous hypertrophy. Mild multifactorial stenosis but no definite neural compression. L3-4: Endplate osteophytes and bulging of the disc. Facet and ligamentous hypertrophy. Mild multifactorial stenosis but no definite neural compression. L4-5: Circumferential bulging of the disc. Bilateral facet degeneration and hypertrophy. Mild stenosis of the lateral recesses but without neural compression. L5-S1:  Bulging of the disc.  Mild facet degeneration.  No stenosis. No complication is seen at the L3-4 level. IMPRESSION: No complications seen following unsuccessful lumbar puncture. Mild multifactorial spinal stenosis at L2-3 and L3-4 could have contributed to the difficulty at at lumbar puncture. Degenerative facet arthropathy at L3-4 and L4-5. Should a lumbar puncture be repeated, greater likelihood of success might be obtained at the L4-5 or L5-S1 level. Electronically Signed   By: Nelson Chimes M.D.   On: 08/12/2015 19:13   US Abdomen Complete  08/09/2015  CLINICAL DATA:  Acute onset of renal failure. Elevated transaminases. Initial encounter. EXAM: ULTRASOUND ABDOMEN COMPLETE COMPARISON:  CT of the abdomen and pelvis performed 11/21/2013 FINDINGS: Gallbladder: Status post cholecystectomy.  No retained stones seen. Common bile duct: Diameter: 0.5 cm, within normal limits in caliber. Liver: No focal lesion identified. Within normal limits in parenchymal echogenicity, though mildly coarsened echotexture is noted. IVC: No abnormality visualized. Pancreas: Visualized portion unremarkable. Spleen: Size and appearance within normal limits. Right Kidney: Length: 15.3 cm. Echogenicity within normal limits. No mass or hydronephrosis visualized. Left Kidney: Length: 14.3 cm. Echogenicity within normal limits. No mass or hydronephrosis visualized. Abdominal aorta: No aneurysm  visualized. Scattered calcific atherosclerotic disease is noted along the abdominal aorta. Other  findings: None. IMPRESSION: 1. Status post cholecystectomy. No acute abnormality seen within the abdomen. 2. Kidneys unremarkable in appearance. 3. Scattered calcific atherosclerotic disease along the abdominal aorta. Electronically Signed   By: Garald Balding M.D.   On: 08/28/2015 21:43   Dg Chest Port 1 View  08/13/2015  CLINICAL DATA:  Assess nasogastric tube placement EXAM: PORTABLE CHEST 1 VIEW COMPARISON:  None and PACs FINDINGS: Portable chest x-ray: The lungs are adequately inflated. There is persistent increased density in the left lung base laterally with partial obscuration of the hemidiaphragm. The pulmonary interstitial markings remain increased. The cardiac silhouette remains enlarged and the central pulmonary vascularity is engorged. The endotracheal tube tip lies 3.5 cm above the carina. The esophagogastric tube tip projects below the inferior margin of the image and is discussed below. Portable supine AP view of the abdomen: The esophagogastric tube tip in proximal per jet port project in the gastric body. They are below the expected location of the GE junction. The bowel gas pattern is unremarkable. IMPRESSION: 1. Persistent left lower lobe atelectasis and small left pleural effusion. Stable mild low-grade CHF. The endotracheal tube is in reasonable position. 2. The nasogastric tube tip and proximal port lie in the region of the gastric body below the expected location of the GE junction. Electronically Signed   By: David  Martinique M.D.   On: 08/13/2015 07:22   Dg Chest Port 1 View  08/27/2015  CLINICAL DATA:  Acute onset of respiratory failure. Initial encounter. EXAM: PORTABLE CHEST 1 VIEW COMPARISON:  Chest radiograph performed earlier today at 8:59 a.m. FINDINGS: The patient's endotracheal tube is seen ending 3 cm above the carina. An enteric tube is noted extending below the diaphragm. A small  left pleural effusion is noted, with left basilar airspace opacification. Underlying vascular congestion is noted. This may reflect pneumonia or asymmetric interstitial edema. No pneumothorax is seen. The cardiomediastinal silhouette is borderline enlarged. No acute osseous abnormalities are identified. IMPRESSION: 1. Endotracheal tube seen ending 3 cm above the carina. 2. Small left pleural effusion, with left basilar airspace opacification. Underlying vascular congestion and borderline cardiomegaly. This may reflect pneumonia or asymmetric interstitial edema. Electronically Signed   By: Garald Balding M.D.   On: 08/27/2015 19:51   Dg Shoulder Left Port  08/12/2015  CLINICAL DATA:  69 year old female with fall and left shoulder bruising. EXAM: LEFT SHOULDER - 1 VIEW COMPARISON:  None. FINDINGS: No fracture or dislocation. There is diffuse soft tissue swelling of the left shoulder. IMPRESSION: No fracture or dislocation. Electronically Signed   By: Anner Crete M.D.   On: 08/12/2015 01:04   Dg Abd Portable 1v  08/13/2015  CLINICAL DATA:  Assess nasogastric tube placement EXAM: PORTABLE CHEST 1 VIEW COMPARISON:  None and PACs FINDINGS: Portable chest x-ray: The lungs are adequately inflated. There is persistent increased density in the left lung base laterally with partial obscuration of the hemidiaphragm. The pulmonary interstitial markings remain increased. The cardiac silhouette remains enlarged and the central pulmonary vascularity is engorged. The endotracheal tube tip lies 3.5 cm above the carina. The esophagogastric tube tip projects below the inferior margin of the image and is discussed below. Portable supine AP view of the abdomen: The esophagogastric tube tip in proximal per jet port project in the gastric body. They are below the expected location of the GE junction. The bowel gas pattern is unremarkable. IMPRESSION: 1. Persistent left lower lobe atelectasis and small left pleural effusion.  Stable mild low-grade CHF. The endotracheal  tube is in reasonable position. 2. The nasogastric tube tip and proximal port lie in the region of the gastric body below the expected location of the GE junction. Electronically Signed   By: David  Martinique M.D.   On: 08/13/2015 07:22   Dg Fluoro Guide Lumbar Puncture  08/12/2015  CLINICAL DATA:  Acute and subacute stroke. Possible meningitis. Failed lumbar puncture on the floor. EXAM: DIAGNOSTIC LUMBAR PUNCTURE UNDER FLUOROSCOPIC GUIDANCE FLUOROSCOPY TIME:  Radiation Exposure Index (as provided by the fluoroscopic device): 332 microGy*m^2 PROCEDURE: I discussed the risks (including hemorrhage, infection, headache, and nerve damage, among others), benefits, and alternatives to fluoroscopically guided lumbar puncture with the patient's husband by telephone consent. The patient is intubated and not able to provide consent herself. We specifically discussed the technical likelihood of success of the procedure. The patient's husband understood and elected for the patient to undergo the procedure. Standard time-out was employed. Respiratory therapy and assist personnel were present. The patient was turned into the prone position with careful attention to maintaining the endotracheal tube. Following sterile skin prep and local anesthetic administration consisting of 1 percent lidocaine, a 22 gauge 5 inch spinal needle was advanced into the thecal sac at the at the L3-4 level. Frankly bloody fluid was returned. Opening pressure was not obtained due to the difficulty in turning the patient. 3 cc of bloody CSF was collected. This did not clear. There was observed clotting. Because of plugging of the needle and tubing, I removed the needle and advanced a second needle at L3-4 into the spinal canal. Once again I felt the loss of resistance of the epidural space and pop of presumed dural penetration. Bloody fluid flowed under low pressure reminiscent of CSF pressure. I obtained  another 4 cc of bloody fluid. The needle was subsequently removed and the skin cleansed and bandaged. No immediate complications were observed. IMPRESSION: 1. With needle positioning in the spinal canal, bloody fluid was returned and at least the first tube seemed to clot, and the bloody appearance did not clear. This would be atypical for CSF blood from subarachnoid hemorrhage, which will typically not clot. I speculate that there may be dilated epidural venous plexus which was sampled rather than actual CSF (unsuccessful lumbar puncture). Attempt at new positioning with a second needle (also at the L3-4 level) yielded the same result. Needle positioning was straightforward, well visualized, and relatively atraumatic with both advancements. Given the reduced ability to assess for neurologic signs of epidural hematoma due to the ventilation and clinical condition, a low threshold for lumbar MRI should be considered. Lumbar MRI would also allow [place] to assess the best location for lumbar puncture, for example if the patient has prominent epidural adipose tissue. I discussed these impressions by telephone with Dr. Erlinda Hong at 12:55 PM on 06/12/2015. Electronically Signed   By: Van Clines M.D.   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 [id number] Echo >> EF 50%, diffuse hypokinesis, grade 1 diastolic dysfx, mod AS, mod LA dilation 12/05 EEG >> generalized slowing 12/05 MRI lumbar spine >> L2-3, L3-4 spinal stenosis, degenerative arthropathy L3-4, L4-5  CULTURES: 12/03 Blood ([place]) >> 12/03 Urine ([place]) >> 12/04 Blood >> 12/04 Respiratory viral panel >> 12/04 Pneumococcal Ag >> negative 12/05 CSF >>  ANTIBIOTICS: 12/04 Tressie Ellis >> 12/04 Ampicillin >> 12/04  Vancomycin >>   SIGNIFICANT EVENTS: 12/03 Admit to OSH w/ Seizure & Fall 12/04 Transfer to [facility]; weaned off pressors; neurology consulted 12/05 Difficulty with LP due to spinal stenosis >> only cultures sent; add HCO3 to IV fluid 12/06 Nephrology consulted; d/c HCO3 from IV fluid  LINES/TUBES: 12/03 ETT [facility]) >>  DISCUSSION: 69 yo female presented to [facility] with 2 weeks of HA, and had developed fever up to 102F.  She had fall at home with Rtward gaze and Lt sided weakness.  She is reported to have seizure at [facility].  There is also report of ventricular fibrillation at [facility] >> ?if this was ECG artifact in setting of seizure.  She has hx of DM, HTN, TIA, Hypothyroidism, Neuropathy, Aortic stenosis, CAD, GERD, A fib, Rt foot osteomyelitis.  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 Hx of DM neuropathy. P:   RASS goal 0 AED's per neurology F/u LP 12/05 Hold outpt cymbalta  PULMONARY A: Acute hypoxic respiratory failure and concern for pneumonia >> might have had viral prodrome. P:   Pressure support wean as tolerated >> not ready for extubation trial yet F/u CXR  CARDIOVASCULAR A:  Hx of A fib >> not on anticoagulation as outpt. Hx of HTN, CAD, HLD, mod aortic stenosis. Septic shock >> weaned off pressors 12/04. ? Of VF at outside hospital >> might have been ECG artifact in setting of seizure. Elevated troponin >> demand ischemia. Intermittent bradycardia. P:  Monitor hemodynamics Defer cardiology assessment for now Restart norvasc, lipitor, ASA Hold outpt lasix, cozaar  RENAL A:   AKI with oliguria >> baseline creatinine 0.8 from August 2014. Non gap metabolic acidosis >> improved. Hypomagnesemia. P:   Replace electrolytes as needed D/c HCO3 from IV fluid Consult nephrology  GASTROINTESTINAL A:   Elevated LFT's in setting of hypotension >> resolved. Nutrition. P:   Tube  feeds Protonix for SUP  HEMATOLOGIC A:   Anemia of critical illness and chronic disease. P:  F/u CBC SCD's, SQ heparin for DVT prevention  INFECTIOUS A:   Septic shock >> likely from pneumonia with possible viral prodrome.  Less concern for meningitis/encephalitis given CT head and MRI brain findings. Chronic Rt foot osteomyelitis. P:   Day 3 ampicillin, fortaz, vancomycin >> if LP negative, then narrow Abx F/u procalcitonin  ENDOCRINE A:   Hx of DM, hypothyroidism. P:   SSI Hold outpt glimepiride, metformin Continue synthroid  Updated pt's family at bedside.  CC time 42 minutes.  Chesley Mires, MD [facility] Pulmonary/Critical Care 08/13/2015, 9:12 AM Pager:  [phone] After 3pm call: [phone]

## 2015-08-13 NOTE — Progress Notes (Signed)
[facility] Progress Note Patient Name: Melissa Becker DOB: 1946-02-22 MRN: [id number]   Date of Service  08/13/2015  HPI/Events of Note  Hypertension  eICU Interventions  Labetalol prn     Intervention Category Intermediate Interventions: Hypertension - evaluation and management  Simonne Maffucci 08/13/2015, 3:50 AM

## 2015-08-14 ENCOUNTER — Inpatient Hospital Stay (HOSPITAL_COMMUNITY): Payer: Medicare Other

## 2015-08-14 LAB — CBC
HEMATOCRIT: 30.1 % — AB (ref 36.0–46.0)
Hemoglobin: 9 g/dL — ABNORMAL LOW (ref 12.0–15.0)
MCH: 26.5 pg (ref 26.0–34.0)
MCHC: 29.9 g/dL — AB (ref 30.0–36.0)
MCV: 88.5 fL (ref 78.0–100.0)
PLATELETS: 177 10*3/uL (ref 150–400)
RBC: 3.4 MIL/uL — ABNORMAL LOW (ref 3.87–5.11)
RDW: 15.5 % (ref 11.5–15.5)
WBC: 8.9 10*3/uL (ref 4.0–10.5)

## 2015-08-14 LAB — RENAL FUNCTION PANEL
ALBUMIN: 2.1 g/dL — AB (ref 3.5–5.0)
ANION GAP: 15 (ref 5–15)
BUN: 76 mg/dL — ABNORMAL HIGH (ref 6–20)
CALCIUM: 7.1 mg/dL — AB (ref 8.9–10.3)
CO2: 22 mmol/L (ref 22–32)
Chloride: 105 mmol/L (ref 101–111)
Creatinine, Ser: 6.06 mg/dL — ABNORMAL HIGH (ref 0.44–1.00)
GFR calc non Af Amer: 6 mL/min — ABNORMAL LOW (ref >60)
GFR, EST AFRICAN AMERICAN: 7 mL/min — AB (ref >60)
Glucose, Bld: 194 mg/dL — ABNORMAL HIGH (ref 65–99)
PHOSPHORUS: 7.9 mg/dL — AB (ref 2.5–4.6)
POTASSIUM: 3.6 mmol/L (ref 3.5–5.1)
SODIUM: 142 mmol/L (ref 135–145)

## 2015-08-14 LAB — LEGIONELLA ANTIGEN, URINE

## 2015-08-14 LAB — GLUCOSE, CAPILLARY
GLUCOSE-CAPILLARY: 134 mg/dL — AB (ref 65–99)
GLUCOSE-CAPILLARY: 170 mg/dL — AB (ref 65–99)
GLUCOSE-CAPILLARY: 200 mg/dL — AB (ref 65–99)
GLUCOSE-CAPILLARY: 231 mg/dL — AB (ref 65–99)
GLUCOSE-CAPILLARY: 246 mg/dL — AB (ref 65–99)
Glucose-Capillary: 244 mg/dL — ABNORMAL HIGH (ref 65–99)

## 2015-08-14 LAB — MAGNESIUM: Magnesium: 2 mg/dL (ref 1.7–2.4)

## 2015-08-14 LAB — PHENYTOIN LEVEL, TOTAL: PHENYTOIN LVL: 6.4 ug/mL — AB (ref 10.0–20.0)

## 2015-08-14 MED ORDER — DEXTROSE 5 % IV SOLN
160.0000 mg | Freq: Three times a day (TID) | INTRAVENOUS | Status: DC
  Filled 2015-08-14 (×2): qty 16

## 2015-08-14 MED ORDER — METOPROLOL TARTRATE 25 MG PO TABS
25.0000 mg | ORAL_TABLET | Freq: Two times a day (BID) | ORAL | Status: DC
  Administered 2015-08-14 – 2015-08-20 (×7): 25 mg via ORAL
  Filled 2015-08-14 (×16): qty 1

## 2015-08-14 MED ORDER — FUROSEMIDE 10 MG/ML IJ SOLN
160.0000 mg | Freq: Three times a day (TID) | INTRAVENOUS | Status: DC
  Administered 2015-08-14 – 2015-08-16 (×6): 160 mg via INTRAVENOUS
  Filled 2015-08-14 (×8): qty 16

## 2015-08-14 MED ORDER — PHENYTOIN SODIUM 50 MG/ML IJ SOLN
100.0000 mg | Freq: Three times a day (TID) | INTRAMUSCULAR | Status: DC
  Administered 2015-08-14 – 2015-08-18 (×13): 100 mg via INTRAVENOUS
  Filled 2015-08-14 (×16): qty 2

## 2015-08-14 MED ORDER — FUROSEMIDE 10 MG/ML IJ SOLN
160.0000 mg | Freq: Once | INTRAVENOUS | Status: AC
  Administered 2015-08-14: 160 mg via INTRAVENOUS
  Filled 2015-08-14: qty 16

## 2015-08-14 MED ORDER — SODIUM CHLORIDE 0.9 % IV SOLN
250.0000 mL | INTRAVENOUS | Status: DC
  Administered 2015-08-18: 500 mL via INTRAVENOUS
  Administered 2015-08-20: 10 mL via INTRAVENOUS

## 2015-08-14 NOTE — Progress Notes (Signed)
Patient was transferred from 3M08 to 2M13 on vent. No complications.

## 2015-08-14 NOTE — Progress Notes (Signed)
PULMONARY / CRITICAL CARE MEDICINE   Name: Melissa Becker MRN: [id number] DOB: 09-23-1945    ADMISSION DATE:  08/30/2015  REFERRING MD:  Anselmo Pickler, D.O. [facility])  CHIEF COMPLAINT:  Sepsis  SUBJECTIVE:  Paradoxical breathing.  VITAL SIGNS: BP 143/76 mmHg  Pulse 72  Temp(Src) 97.7 F (36.5 C) (Oral)  Resp 19  Ht 5\' 6"  (1.676 m)  Wt 254 lb 13.6 oz (115.6 kg)  BMI 41.15 kg/m2  SpO2 94%  HEMODYNAMICS:    VENTILATOR SETTINGS: Vent Mode:  [-] PRVC FiO2 (%):  [30 %] 30 % Set Rate:  [16 bmp] 16 bmp Vt Set:  [550 mL] 550 mL PEEP:  [5 cmH20] 5 cmH20 Plateau Pressure:  [16 cmH20-21 cmH20] 20 cmH20  INTAKE / OUTPUT: I/O last 3 completed shifts: In: 4717.9 [I.V.:2519.8; NG/GT:1089.2; IV [id number] Out: 2630 [Urine:2330; Emesis/NG output:150; Stool:150]  PHYSICAL EXAMINATION: General: RASS -3 Neuro: pupils reactive, downward gaze HEENT: ETT in place Cardiac: irregular, no murmur Chest: b/lcrackles Abd: soft, non tender Ext: 2+ edema Skin: no rashes  LABS; CBC Recent Labs     08/12/15  0635  08/13/15  0238  08/14/15  0335  WBC  13.5*  9.8  8.9  HGB  9.1*  9.1*  9.0*  HCT  30.1*  30.4*  30.1*  PLT  183  163  177    Coag's Recent Labs     09/06/2015  2010  APTT  28  INR  1.32    BMET Recent Labs     08/12/15  0635  08/13/15  0238  08/14/15  0335  NA  142  143  142  K  4.3  4.0  3.6  CL  111  108  105  CO2  16*  22  22  BUN  49*  60*  76*  CREATININE  4.26*  5.44*  6.06*  GLUCOSE  171*  185*  194*    Electrolytes Recent Labs     08/12/15  0635  08/13/15  0238  08/14/15  0335  CALCIUM  7.0*  7.1*  7.1*  MG  1.4*  1.5*  2.0  PHOS  7.1*  7.6*  7.9*    Sepsis Markers Recent Labs     09/06/2015  2010  08/12/15  0635  08/13/15  0238  PROCALCITON  19.06  16.71  12.60    ABG Recent Labs     08/12/15  0055  08/13/15  0405  PHART  7.320*  7.367  PCO2ART  37.7  37.1  PO2ART  94.0  111*    Liver Enzymes Recent Labs      09/05/2015  2010  08/13/15  0238  08/14/15  0335  AST  51*  26   --   ALT  78*  52   --   ALKPHOS  60  54   --   BILITOT  0.5  0.5   --   ALBUMIN  2.7*  2.4*  2.1*    Cardiac Enzymes Recent Labs     09/01/2015  2010  08/12/15  0055  08/12/15  0635  TROPONINI  4.62*  3.89*  2.48*    Glucose Recent Labs     08/13/15  0814  08/13/15  1126  08/13/15  1541  08/13/15  2019  08/13/15  2352  08/14/15  0318  GLUCAP  167*  207*  189*  181*  200*  170*    Imaging Mr [facility] Contrast  08/12/2015  CLINICAL DATA:  69 year old diabetic hypertensive female with coronary artery disease presenting with 2 week history of headache. Unwitnessed fall 5 days ago. Left-sided weakness. Subsequent encounter. EXAM: MRI HEAD WITHOUT CONTRAST MRA HEAD WITHOUT CONTRAST TECHNIQUE: Multiplanar, multiecho pulse sequences of the brain and surrounding structures were obtained without intravenous contrast. Angiographic images of the head were obtained using MRA technique without contrast. COMPARISON:  08/18/2015 head CT.  02/13/2010 brain MR. FINDINGS: MRI HEAD FINDINGS Exam is motion degraded. Numerous bilateral acute/subacute nonhemorrhagic infarcts. Most confluent infarcts have an appearance of acute/subacute infarct involves portions of the right frontal -parietal - occipital and posterior right temporal lobe. Swelling of gyri without significant local mass effect. Smaller acute nonhemorrhagic infarcts scattered throughout the hemispheres bilaterally (frontal lobes, parietal lobes, occipital lobes), right temporal lobe/subinsular region and cerebellum bilaterally. The involvement of multiple vascular distributions raises possibility of embolic disease. Remote small right cerebellar infarct. No intracranial hemorrhage. Mild global atrophy without hydrocephalus. No intracranial mass lesion noted on this unenhanced exam. Mild exophthalmos. Partially empty expanded sella without flattening of the posterior globes  as may be seen with pseudotumor. Cervical medullary junction and pineal region unremarkable. MRA HEAD FINDINGS Exam is slightly motion degraded. Mild irregularity and slight narrowing cavernous segment right internal carotid artery where there is a minimal bulge which has an appearance most suggestive of result of atherosclerotic type changes rather than aneurysm. Fetal type contribution to the posterior cerebral arteries bilaterally. No significant stenosis of either carotid terminus or M1 segment of either middle cerebral artery. Middle cerebral artery branch vessel irregularity and narrowing bilaterally. Mild narrowing and irregularity A1 segment right anterior cerebral artery. Bulge of the distal A1 segment of the right anterior cerebral artery appears to be origin of a vessel rather than saccular aneurysm. Mild to moderate narrowing A2 segment right anterior cerebral artery. Left vertebral artery is dominant. Mild moderate narrowing distal right vertebral artery. Mild to moderate narrowing portions of the posterior inferior cerebellar artery bilaterally. Mild irregularity and minimal narrowing basilar artery without high-grade stenosis. Mild to moderate narrowing portions of the anterior inferior cerebellar artery bilaterally. Narrowing of the superior cerebellar artery more notable on the left. Mild to moderate narrowing portions of the proximal, mid and distal aspect of the posterior cerebral artery bilaterally. Tiny bulge superior margin P1 segments left posterior cerebral artery. Question tiny aneurysm (less than 2 mm). A vessel may rise from this region. IMPRESSION: MRI HEAD Exam is motion degraded. Numerous bilateral acute/subacute nonhemorrhagic infarcts. Most confluent infarcts have an appearance of acute/subacute infarct involves portions of the right frontal -parietal - occipital and posterior right temporal lobe. Swelling of gyri without significant local mass effect. Smaller acute nonhemorrhagic  infarcts scattered throughout the hemispheres bilaterally (frontal lobes, parietal lobes, occipital lobes), right temporal lobe/subinsular region and cerebellum bilaterally. The involvement of multiple vascular distributions raises possibility of embolic disease. Remote small right cerebellar infarct. MRA HEAD Exam is slightly motion degraded. Mild irregularity and slight narrowing cavernous segment right internal carotid artery. Small bulge may be related to atherosclerotic type changes rather than aneurysm. No significant stenosis of either carotid terminus or M1 segment of either middle cerebral artery. Middle cerebral artery branch vessel irregularity and narrowing bilaterally. Mild narrowing and irregularity A1 segment right anterior cerebral artery. Bulge of the distal A1 segment of the right anterior cerebral artery appears to be origin of a vessel rather than saccular aneurysm. Mild to moderate narrowing A2 segment right anterior cerebral artery. Left vertebral artery is dominant. Mild to moderate  narrowing distal right vertebral artery. Mild to moderate narrowing portions of the posterior inferior cerebellar artery bilaterally. Mild to moderate narrowing portions of the anterior inferior cerebellar artery bilaterally. Narrowing of the superior cerebellar artery more notable on the left. Mild to moderate narrowing portions of the proximal, mid and distal aspect of the posterior cerebral artery bilaterally. Tiny bulge superior margin P1 segments left posterior cerebral artery. Question tiny aneurysm (less than 2 mm). A vessel may rise from this region. Electronically Signed   By: Genia Del M.D.   On: 08/12/2015 10:13   Mr Brain Wo Contrast  08/12/2015  CLINICAL DATA:  69 year old diabetic hypertensive female with coronary artery disease presenting with 2 week history of headache. Unwitnessed fall 5 days ago. Left-sided weakness. Subsequent encounter. EXAM: MRI HEAD WITHOUT CONTRAST MRA HEAD WITHOUT  CONTRAST TECHNIQUE: Multiplanar, multiecho pulse sequences of the brain and surrounding structures were obtained without intravenous contrast. Angiographic images of the head were obtained using MRA technique without contrast. COMPARISON:  08/09/2015 head CT.  02/13/2010 brain MR. FINDINGS: MRI HEAD FINDINGS Exam is motion degraded. Numerous bilateral acute/subacute nonhemorrhagic infarcts. Most confluent infarcts have an appearance of acute/subacute infarct involves portions of the right frontal -parietal - occipital and posterior right temporal lobe. Swelling of gyri without significant local mass effect. Smaller acute nonhemorrhagic infarcts scattered throughout the hemispheres bilaterally (frontal lobes, parietal lobes, occipital lobes), right temporal lobe/subinsular region and cerebellum bilaterally. The involvement of multiple vascular distributions raises possibility of embolic disease. Remote small right cerebellar infarct. No intracranial hemorrhage. Mild global atrophy without hydrocephalus. No intracranial mass lesion noted on this unenhanced exam. Mild exophthalmos. Partially empty expanded sella without flattening of the posterior globes as may be seen with pseudotumor. Cervical medullary junction and pineal region unremarkable. MRA HEAD FINDINGS Exam is slightly motion degraded. Mild irregularity and slight narrowing cavernous segment right internal carotid artery where there is a minimal bulge which has an appearance most suggestive of result of atherosclerotic type changes rather than aneurysm. Fetal type contribution to the posterior cerebral arteries bilaterally. No significant stenosis of either carotid terminus or M1 segment of either middle cerebral artery. Middle cerebral artery branch vessel irregularity and narrowing bilaterally. Mild narrowing and irregularity A1 segment right anterior cerebral artery. Bulge of the distal A1 segment of the right anterior cerebral artery appears to be origin  of a vessel rather than saccular aneurysm. Mild to moderate narrowing A2 segment right anterior cerebral artery. Left vertebral artery is dominant. Mild moderate narrowing distal right vertebral artery. Mild to moderate narrowing portions of the posterior inferior cerebellar artery bilaterally. Mild irregularity and minimal narrowing basilar artery without high-grade stenosis. Mild to moderate narrowing portions of the anterior inferior cerebellar artery bilaterally. Narrowing of the superior cerebellar artery more notable on the left. Mild to moderate narrowing portions of the proximal, mid and distal aspect of the posterior cerebral artery bilaterally. Tiny bulge superior margin P1 segments left posterior cerebral artery. Question tiny aneurysm (less than 2 mm). A vessel may rise from this region. IMPRESSION: MRI HEAD Exam is motion degraded. Numerous bilateral acute/subacute nonhemorrhagic infarcts. Most confluent infarcts have an appearance of acute/subacute infarct involves portions of the right frontal -parietal - occipital and posterior right temporal lobe. Swelling of gyri without significant local mass effect. Smaller acute nonhemorrhagic infarcts scattered throughout the hemispheres bilaterally (frontal lobes, parietal lobes, occipital lobes), right temporal lobe/subinsular region and cerebellum bilaterally. The involvement of multiple vascular distributions raises possibility of embolic disease. Remote small right cerebellar infarct.  MRA HEAD Exam is slightly motion degraded. Mild irregularity and slight narrowing cavernous segment right internal carotid artery. Small bulge may be related to atherosclerotic type changes rather than aneurysm. No significant stenosis of either carotid terminus or M1 segment of either middle cerebral artery. Middle cerebral artery branch vessel irregularity and narrowing bilaterally. Mild narrowing and irregularity A1 segment right anterior cerebral artery. Bulge of the  distal A1 segment of the right anterior cerebral artery appears to be origin of a vessel rather than saccular aneurysm. Mild to moderate narrowing A2 segment right anterior cerebral artery. Left vertebral artery is dominant. Mild to moderate narrowing distal right vertebral artery. Mild to moderate narrowing portions of the posterior inferior cerebellar artery bilaterally. Mild to moderate narrowing portions of the anterior inferior cerebellar artery bilaterally. Narrowing of the superior cerebellar artery more notable on the left. Mild to moderate narrowing portions of the proximal, mid and distal aspect of the posterior cerebral artery bilaterally. Tiny bulge superior margin P1 segments left posterior cerebral artery. Question tiny aneurysm (less than 2 mm). A vessel may rise from this region. Electronically Signed   By: Genia Del M.D.   On: 08/12/2015 10:13   Mr Lumbar Spine Wo Contrast  08/12/2015  CLINICAL DATA:  Diabetes, hypertension and heart disease on ventilator support for sepsis. Seizures. Bilateral cerebral infarctions. Unsuccessful lumbar puncture at the L3-4 level. EXAM: MRI LUMBAR SPINE WITHOUT CONTRAST TECHNIQUE: Multiplanar, multisequence MR imaging of the lumbar spine was performed. No intravenous contrast was administered. COMPARISON:  Lumbar puncture images same day. CT abdomen 11/21/2013. FINDINGS: The study suffers from motion degradation but is sufficiently diagnostic. No abnormality is seen the lower thoracic region. The distal cord and conus are normal with conus tip at L1. L1-2:  Mild bulging of the disc.  No stenosis or neural compression. L2-3: Moderate bulging of the disc. Mild facet and ligamentous hypertrophy. Mild multifactorial stenosis but no definite neural compression. L3-4: Endplate osteophytes and bulging of the disc. Facet and ligamentous hypertrophy. Mild multifactorial stenosis but no definite neural compression. L4-5: Circumferential bulging of the disc. Bilateral  facet degeneration and hypertrophy. Mild stenosis of the lateral recesses but without neural compression. L5-S1:  Bulging of the disc.  Mild facet degeneration.  No stenosis. No complication is seen at the L3-4 level. IMPRESSION: No complications seen following unsuccessful lumbar puncture. Mild multifactorial spinal stenosis at L2-3 and L3-4 could have contributed to the difficulty at at lumbar puncture. Degenerative facet arthropathy at L3-4 and L4-5. Should a lumbar puncture be repeated, greater likelihood of success might be obtained at the L4-5 or L5-S1 level. Electronically Signed   By: Nelson Chimes M.D.   On: 08/12/2015 19:13   Dg Chest Port 1 View  08/14/2015  CLINICAL DATA:  Respiratory failure. EXAM: PORTABLE CHEST 1 VIEW COMPARISON:  08/13/2015. FINDINGS: Endotracheal tube and NG tube in stable position. Persistent cardiomegaly. Mild persistent interstitial prominence. Small Bilateral pleural effusions cannot be excluded. Findings suggest mild persistent congestive heart failure. IMPRESSION: 1. Lines and tubes stable position. 2. Persistent cardiomegaly with mild bilateral interstitial prominence and spot possible small pleural effusions. Findings consistent with mild persistent congestive heart failure . Electronically Signed   By: Marcello Moores  Register   On: 08/14/2015 07:25   Dg Chest Port 1 View  08/13/2015  CLINICAL DATA:  Assess nasogastric tube placement EXAM: PORTABLE CHEST 1 VIEW COMPARISON:  None and PACs FINDINGS: Portable chest x-ray: The lungs are adequately inflated. There is persistent increased density in the left lung  base laterally with partial obscuration of the hemidiaphragm. The pulmonary interstitial markings remain increased. The cardiac silhouette remains enlarged and the central pulmonary vascularity is engorged. The endotracheal tube tip lies 3.5 cm above the carina. The esophagogastric tube tip projects below the inferior margin of the image and is discussed below. Portable  supine AP view of the abdomen: The esophagogastric tube tip in proximal per jet port project in the gastric body. They are below the expected location of the GE junction. The bowel gas pattern is unremarkable. IMPRESSION: 1. Persistent left lower lobe atelectasis and small left pleural effusion. Stable mild low-grade CHF. The endotracheal tube is in reasonable position. 2. The nasogastric tube tip and proximal port lie in the region of the gastric body below the expected location of the GE junction. Electronically Signed   By: David  Martinique M.D.   On: 08/13/2015 07:22   Dg Abd Portable 1v  08/13/2015  CLINICAL DATA:  Assess nasogastric tube placement EXAM: PORTABLE CHEST 1 VIEW COMPARISON:  None and PACs FINDINGS: Portable chest x-ray: The lungs are adequately inflated. There is persistent increased density in the left lung base laterally with partial obscuration of the hemidiaphragm. The pulmonary interstitial markings remain increased. The cardiac silhouette remains enlarged and the central pulmonary vascularity is engorged. The endotracheal tube tip lies 3.5 cm above the carina. The esophagogastric tube tip projects below the inferior margin of the image and is discussed below. Portable supine AP view of the abdomen: The esophagogastric tube tip in proximal per jet port project in the gastric body. They are below the expected location of the GE junction. The bowel gas pattern is unremarkable. IMPRESSION: 1. Persistent left lower lobe atelectasis and small left pleural effusion. Stable mild low-grade CHF. The endotracheal tube is in reasonable position. 2. The nasogastric tube tip and proximal port lie in the region of the gastric body below the expected location of the GE junction. Electronically Signed   By: David  Martinique M.D.   On: 08/13/2015 07:22   Dg Fluoro Guide Lumbar Puncture  08/12/2015  CLINICAL DATA:  Acute and subacute stroke. Possible meningitis. Failed lumbar puncture on the floor. EXAM:  DIAGNOSTIC LUMBAR PUNCTURE UNDER FLUOROSCOPIC GUIDANCE FLUOROSCOPY TIME:  Radiation Exposure Index (as provided by the fluoroscopic device): 332 microGy*m^2 PROCEDURE: I discussed the risks (including hemorrhage, infection, headache, and nerve damage, among others), benefits, and alternatives to fluoroscopically guided lumbar puncture with the patient's husband by telephone consent. The patient is intubated and not able to provide consent herself. We specifically discussed the technical likelihood of success of the procedure. The patient's husband understood and elected for the patient to undergo the procedure. Standard time-out was employed. Respiratory therapy and assist personnel were present. The patient was turned into the prone position with careful attention to maintaining the endotracheal tube. Following sterile skin prep and local anesthetic administration consisting of 1 percent lidocaine, a 22 gauge 5 inch spinal needle was advanced into the thecal sac at the at the L3-4 level. Frankly bloody fluid was returned. Opening pressure was not obtained due to the difficulty in turning the patient. 3 cc of bloody CSF was collected. This did not clear. There was observed clotting. Because of plugging of the needle and tubing, I removed the needle and advanced a second needle at L3-4 into the spinal canal. Once again I felt the loss of resistance of the epidural space and pop of presumed dural penetration. Bloody fluid flowed under low pressure reminiscent of CSF pressure. I  obtained another 4 cc of bloody fluid. The needle was subsequently removed and the skin cleansed and bandaged. No immediate complications were observed. IMPRESSION: 1. With needle positioning in the spinal canal, bloody fluid was returned and at least the first tube seemed to clot, and the bloody appearance did not clear. This would be atypical for CSF blood from subarachnoid hemorrhage, which will typically not clot. I speculate that there may  be dilated epidural venous plexus which was sampled rather than actual CSF (unsuccessful lumbar puncture). Attempt at new positioning with a second needle (also at the L3-4 level) yielded the same result. Needle positioning was straightforward, well visualized, and relatively atraumatic with both advancements. Given the reduced ability to assess for neurologic signs of epidural hematoma due to the ventilation and clinical condition, a low threshold for lumbar MRI should be considered. Lumbar MRI would also allow [place] to assess the best location for lumbar puncture, for example if the patient has prominent epidural adipose tissue. I discussed these impressions by telephone with Dr. Erlinda Hong at 12:55 PM on 06/12/2015. Electronically Signed   By: Van Clines M.D.   On: 08/12/2015 13:00       STUDIES:  12/03 CT head [facility]) >> atrophy 12/04 CT head >> acute Rt parietal occipital infarct, Rt cerebellar infarct 12/04 U/S Abd >> unremarkable appearance of kidneys 12/05 MRI/MRA brain >> numerous b/l infarcts concerning for embolic infarcts [id number] Echo >> EF 50%, diffuse hypokinesis, grade 1 diastolic dysfx, mod AS, mod LA dilation 12/05 EEG >> generalized slowing 12/05 MRI lumbar spine >> L2-3, L3-4 spinal stenosis, degenerative arthropathy L3-4, L4-5  CULTURES: 12/03 Blood ([place]) >> 12/03 Urine ([place]) >> 12/04 Blood >> 12/04 Respiratory viral panel >> negative 12/04 Pneumococcal Ag >> negative 12/05 CSF >>  12/06 Legionella Ag >>  ANTIBIOTICS: 12/04 Tressie Ellis >> 12/04 Ampicillin >> 12/07 12/04 Vancomycin >>  12/06 Diflucan >>  SIGNIFICANT EVENTS: 12/03 Admit to OSH w/ Seizure & Fall 12/04 Transfer to [facility]; weaned off pressors; neurology consulted 12/05 Difficulty with LP due to spinal stenosis >> only cultures sent; add HCO3 to IV fluid 12/06 Nephrology consulted; d/c HCO3 from IV fluid  LINES/TUBES: 12/03 ETT Lovie Macadamia) >>  DISCUSSION: 69 yo female presented to [facility] with 2  weeks of HA, and had developed fever up to 102F.  She had fall at home with Rtward gaze and Lt sided weakness.  She is reported to have seizure at [facility].  There is also report of ventricular fibrillation at [facility] >> ?if this was ECG artifact in setting of seizure.  She has hx of DM, HTN, TIA, Hypothyroidism, Neuropathy, Aortic stenosis, CAD, GERD, A fib, Rt foot osteomyelitis.  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 Hx of DM neuropathy. P:   RASS goal 0 AED's per neurology F/u LP 12/05 Hold outpt cymbalta  PULMONARY A: Acute hypoxic respiratory failure and concern for pneumonia >> might have had viral prodrome. Interstitial edema with pleural effusions. P:   Full vent support F/u CXR Negative fluid balance as tolerated  CARDIOVASCULAR A:  Hx of A fib >> not on anticoagulation as outpt. Hx of HTN, CAD, HLD, mod aortic stenosis. Septic shock >> weaned off pressors 12/04. ? Of VF at outside hospital >> might have been ECG artifact in setting of seizure. Elevated troponin >> demand ischemia. Intermittent bradycardia. P:  Monitor hemodynamics Defer cardiology assessment for now Continue norvasc, lipitor, ASA Hold outpt cozaar  RENAL  A:   AKI with oliguria >> baseline creatinine 0.8 from August 2014. Non gap metabolic acidosis >> improved. Hypomagnesemia >> improved. Hypervolemia. P:   Replace electrolytes as needed High dose lasix per renal >> concerned she might ultimately need HD  GASTROINTESTINAL A:   Elevated LFT's in setting of hypotension >> resolved. Nutrition. P:   Tube feeds Protonix for SUP  HEMATOLOGIC A:   Anemia of critical illness and chronic disease. P:  F/u CBC SCD's, SQ heparin for DVT prevention  INFECTIOUS A:   Septic shock >> likely from pneumonia with possible viral prodrome.   Doubt meningitis/encephalitis. Chronic Rt foot osteomyelitis. Yeast  UTI. P:   Day 4 fortaz, vancomycin for pneumonia Day 2 fluconazole for yeast UTI D/c ampicillin 12/07  ENDOCRINE A:   Hx of DM, hypothyroidism. P:   SSI Hold outpt glimepiride, metformin Continue synthroid  CC time 36 minutes.  Chesley Mires, MD [facility] Pulmonary/Critical Care 08/14/2015, 8:04 AM Pager:  [phone] After 3pm call: [phone]

## 2015-08-14 NOTE — Progress Notes (Signed)
Pt placed on PS trial, MD at bedside, increased PS to 20 due to increased WOB, pt continued to have inc WOB, placed back on full support at this time, RT will continue to monitor

## 2015-08-14 NOTE — Progress Notes (Signed)
Inpatient Diabetes Program Recommendations  AACE/ADA: New Consensus Statement on Inpatient Glycemic Control (2015)  Target Ranges:  Prepandial:   less than 140 mg/dL      Peak postprandial:   less than 180 mg/dL (1-2 hours)      Critically ill patients:  140 - 180 mg/dL   Review of Glycemic Control    Inpatient Diabetes Program Recommendations:  Insulin - Meal Coverage: consider adding Novolog 2 units Q4 as tube feed coverage (given in addition to correction scale; held if tube feeds interrupted or held for any reason) Thank you  Raoul Pitch BSN, RN,CDE Inpatient Diabetes Coordinator [phone] (team pager)

## 2015-08-14 NOTE — Progress Notes (Signed)
Pharmacy asked to manage fosphenytoin.  As per protocol, fosphenytoin is preferred in pediatric patients.  In addition, it is restricted to 5 days of use as formate can accumulate and convert to formaldehyde. Further, this patient has AKI with high phosphorous-Fosphenytoin contains a high amount of phosphorous.  12/7 @ 0335: 6.4 with alb 2.1 and poor renal fxn- corrects to 20. This is reflective of load, not maintenance dose.  Plan: -change fosphenytoin 100PE IV TID to Phenytoin 100mg  IV TID -watch interaction with fluconazole -daily phenytoin levels canceled as they are NOT helpful to management d/t kinetics of fosphenytoin/phenytoin.  -will check level at SS (5-7 days after initiation)  Aseneth Hack D. Ginny Loomer, PharmD, BCPS Clinical Pharmacist Pager: [phone] 08/14/2015 9:18 AM

## 2015-08-14 NOTE — Progress Notes (Signed)
ANTIBIOTIC CONSULT NOTE  Pharmacy Consult for fluconazole; vancomycin + ceftazidime Indication: UTI; aspiration PNA, chronic osteo  Allergies  Allergen Reactions  . Sulfamethoxazole-Trimethoprim Other (See Comments)    Headaches  . Latex Itching    whelps  . Tape   Patient Measurements: Height: 5\' 6"  (167.6 cm) Weight: 254 lb 13.6 oz (115.6 kg) IBW/kg (Calculated) : 59.3 Vital Signs: Temp: 98.3 F (36.8 C) (12/07 0805) Temp Source: Oral (12/07 0805) BP: 190/82 mmHg (12/07 0900) Pulse Rate: 82 (12/07 0900) Intake/Output from previous day: 12/06 0701 - 12/07 0700 In: 3643.3 [I.V.:1545.2; NG/GT:1089.2; IV Piggyback:1009] Out: 2270 [Urine:2120; Stool:150] Intake/Output from this shift: Total I/O In: 304.6 [I.V.:98.6; NG/GT:90; IV Piggyback:116] Out: 150 [Urine:150] Labs:  Recent Labs  08/12/15 0635 08/12/15 1438 08/13/15 0238 08/13/15 1622 08/14/15 0335  WBC 13.5*  --  9.8  --  8.9  HGB 9.1*  --  9.1*  --  9.0*  PLT 183  --  163  --  177  LABCREA  --  149.04  --  68.55  --   CREATININE 4.26*  --  5.44*  --  6.06*   Estimated Creatinine Clearance: 11.2 mL/min (by C-G formula based on Cr of 6.06).  Recent Labs  08/10/2015 2010  VANCORANDOM 62     Microbiology: Recent Results (from the past 720 hour(s))  MRSA PCR Screening     Status: None   Collection Time: 08/12/2015  6:20 PM  Result Value Ref Range Status   MRSA by PCR NEGATIVE NEGATIVE Final    Comment:        The GeneXpert MRSA Assay (FDA approved for NASAL specimens only), is one component of a comprehensive MRSA colonization surveillance program. It is not intended to diagnose MRSA infection nor to guide or monitor treatment for MRSA infections.   Culture, blood (routine x 2)     Status: None (Preliminary result)   Collection Time: 08/10/2015  7:51 PM  Result Value Ref Range Status   Specimen Description BLOOD BLOOD RIGHT FOREARM  Final   Special Requests BOTTLES DRAWN AEROBIC AND ANAEROBIC 5CC   Final   Culture NO GROWTH 2 DAYS  Final   Report Status PENDING  Incomplete  Culture, blood (routine x 2)     Status: None (Preliminary result)   Collection Time: 08/17/2015  8:02 PM  Result Value Ref Range Status   Specimen Description BLOOD RIGHT HAND  Final   Special Requests BOTTLES DRAWN AEROBIC ONLY 6CC  Final   Culture NO GROWTH 2 DAYS  Final   Report Status PENDING  Incomplete  Culture, Urine     Status: None   Collection Time: 08/10/2015  8:05 PM  Result Value Ref Range Status   Specimen Description URINE, CATHETERIZED  Final   Special Requests Normal  Final   Culture >=100,000 COLONIES/mL YEAST  Final   Report Status 08/13/2015 FINAL  Final  Respiratory virus panel     Status: None   Collection Time: 08/26/2015 11:30 PM  Result Value Ref Range Status   Source - RVPAN NASOPHARYNGEAL  Corrected   Respiratory Syncytial Virus A Negative Negative Final   Respiratory Syncytial Virus B Negative Negative Final   Influenza A Negative Negative Final   Influenza B Negative Negative Final   Parainfluenza 1 Negative Negative Final   Parainfluenza 2 Negative Negative Final   Parainfluenza 3 Negative Negative Final   Metapneumovirus Negative Negative Final   Rhinovirus Negative Negative Final   Adenovirus Negative Negative Final  Comment: (NOTE) Performed At: [facility] [place], [place] [id number] Lindon Romp MD [id number]   Anaerobic culture     Status: None (Preliminary result)   Collection Time: 08/12/15 12:10 PM  Result Value Ref Range Status   Specimen Description CSF  Final   Special Requests NO2 2CC CSF SPECIMEN IS CLOTTED  Final   Gram Stain   Final    FEW WBC PRESENT, PREDOMINANTLY PMN NO ORGANISMS SEEN    Culture   Final    NO ANAEROBES ISOLATED; CULTURE IN PROGRESS FOR 5 DAYS   Report Status PENDING  Incomplete  CSF culture     Status: None (Preliminary result)   Collection Time: 08/12/15 12:10 PM  Result Value Ref Range Status    Specimen Description CSF  Final   Special Requests NO2 2CC CSF SPECIMEN CLOTTED  Final   Gram Stain   Final    FEW WBC PRESENT, PREDOMINANTLY PMN NO ORGANISMS SEEN    Culture NO GROWTH < 24 HOURS  Final   Report Status PENDING  Incomplete  Fungus Culture with Smear     Status: None (Preliminary result)   Collection Time: 08/12/15 12:10 PM  Result Value Ref Range Status   Specimen Description CSF  Final   Special Requests NONE  Final   Fungal Smear   Final    NO YEAST OR FUNGAL ELEMENTS SEEN Performed at [facility]    Culture   Final    CULTURE IN PROGRESS FOR FOUR WEEKS Performed at [facility]    Report Status PENDING  Incomplete   Assessment: 61 YOF transferred from [facility] with sepsis. Some concern of meningitis on admit, also with new onset seizures. Now suspected aspiration PNA and UTI, also with acute renal failure. Urethral cath in place since 12/3 - placed at [facility].   WBC now nml at 8.9, afebrile.   12/4 RVP - neg 12/4 Blood x 2 - ngtd 12/4 Urine - yeast 12/4 MRSA PCR neg 12/5 CSF: ngtd 12/5 fungal: ngtd  SCr up to 6.06, CrCl ~10-36mL/min. On high dose Lasix.  Goal of Therapy:  Vancomycin trough level 15-20 mcg/ml Clinical resolution of infection  Plan:  -Vanc 1500 mg IV q48h per obesity nomogram -Ceftazidime 2gm IV q24h -Fluconazole  50mg  IV q24h -Monitor clinical status, cultures + LP, renal function/renal plans, consider VR at end of the week  Anatalia Kronk D. Kawana Hegel, PharmD, BCPS Clinical Pharmacist Pager: [phone] 08/14/2015 9:31 AM

## 2015-08-14 NOTE — Progress Notes (Addendum)
[facility] Rounding Note Subjective:   Pt intubated, not sedated but RN says has moved RUE and withdrew to pain earlier Opens eyes but does not follow commands Pretty good UOP in response to high doses lasix  Objective:    Vital signs in last 24 hours: Filed Vitals:   08/14/15 1000 08/14/15 1023 08/14/15 1100 08/14/15 1113  BP: 184/80 184/80 171/64 171/64  Pulse: 86 83 71 74  Temp:      TempSrc:      Resp: 23  18 20   Height:      Weight:      SpO2: 96%  96% 97%   Weight change:   Intake/Output Summary (Last 24 hours) at 08/14/15 1122 Last data filed at 08/14/15 1000  Gross per 24 hour  Intake 3342.52 ml  Output   2670 ml  Net 672.52 ml    Physical Exam:  Blood pressure 171/64, pulse 74, temperature 98.3 F (36.8 C), temperature source Oral, resp. rate 20, height 5\' 6"  (1.676 m), weight 115.6 kg (254 lb 13.6 oz), SpO2 97 %. Gen: Intubated, pale WF Eyes open. Not responsive to me Skin: Bruising noted, ecchymoses arms. Arm edema less  Neck: Cannot see neck veins Chest: Crackles bases/ant fairly clear Heart: Distant heart sounds. Irreg S1S2 No S3 1/7 murmur USB No diastolic murmur Abdomen: soft, obese, no masses felt + BS Ext: Pitting edema of both arms that looks some better    Labs:   Recent Labs Lab 08/15/2015 2010 08/12/15 0635 08/13/15 0238 08/14/15 0335  NA 142 142 143 142  K 4.3 4.3 4.0 3.6  CL 109 111 108 105  CO2 20* 16* 22 22  GLUCOSE 187* 171* 185* 194*  BUN 45* 49* 60* 76*  CREATININE 3.91* 4.26* 5.44* 6.06*  CALCIUM 7.3* 7.0* 7.1* 7.1*  PHOS 7.4* 7.1* 7.6* 7.9*     Recent Labs Lab 08/26/2015 2010 08/13/15 0238 08/14/15 0335  AST 51* 26  --   ALT 78* 52  --   ALKPHOS 60 54  --   BILITOT 0.5 0.5  --   PROT 6.2* 5.8*  --   ALBUMIN 2.7* 2.4* 2.1*    Recent Labs Lab 08/23/2015 2010  LIPASE 36  AMYLASE 54     Recent Labs Lab 08/12/15 0015 08/12/15 0635 08/13/15 0238 08/14/15 0335  WBC  --  13.5* 9.8 8.9  HGB  --   9.1* 9.1* 9.0*  HCT  --  30.1* 30.4* 30.1*  MCV  --  90.9 89.9 88.5  PLT 207 183 163 177     Recent Labs Lab 08/27/2015 2010 08/12/15 0055 08/12/15 0635 08/13/15 0238  CKTOTAL  --   --   --  134  TROPONINI 4.62* 3.89* 2.48*  --      Recent Labs Lab 08/13/15 1541 08/13/15 2019 08/13/15 2352 08/14/15 0318 08/14/15 0757  GLUCAP 189* 181* 200* 170* 134*     Studies/Results: Mr Lumbar Spine Wo Contrast  08/12/2015  CLINICAL DATA:  Diabetes, hypertension and heart disease on ventilator support for sepsis. Seizures. Bilateral cerebral infarctions. Unsuccessful lumbar puncture at the L3-4 level. EXAM: MRI LUMBAR SPINE WITHOUT CONTRAST TECHNIQUE: Multiplanar, multisequence MR imaging of the lumbar spine was performed. No intravenous contrast was administered. COMPARISON:  Lumbar puncture images same day. CT abdomen 11/21/2013. FINDINGS: The study suffers from motion degradation but is sufficiently diagnostic. No abnormality is seen the lower thoracic region. The distal cord and conus are normal with conus tip at L1. L1-2:  Mild bulging of the disc.  No stenosis or neural compression. L2-3: Moderate bulging of the disc. Mild facet and ligamentous hypertrophy. Mild multifactorial stenosis but no definite neural compression. L3-4: Endplate osteophytes and bulging of the disc. Facet and ligamentous hypertrophy. Mild multifactorial stenosis but no definite neural compression. L4-5: Circumferential bulging of the disc. Bilateral facet degeneration and hypertrophy. Mild stenosis of the lateral recesses but without neural compression. L5-S1:  Bulging of the disc.  Mild facet degeneration.  No stenosis. No complication is seen at the L3-4 level. IMPRESSION: No complications seen following unsuccessful lumbar puncture. Mild multifactorial spinal stenosis at L2-3 and L3-4 could have contributed to the difficulty at at lumbar puncture. Degenerative facet arthropathy at L3-4 and L4-5. Should a lumbar  puncture be repeated, greater likelihood of success might be obtained at the L4-5 or L5-S1 level. Electronically Signed   By: Nelson Chimes M.D.   On: 08/12/2015 19:13   Dg Chest Port 1 View  08/14/2015  CLINICAL DATA:  Respiratory failure. EXAM: PORTABLE CHEST 1 VIEW COMPARISON:  08/13/2015. FINDINGS: Endotracheal tube and NG tube in stable position. Persistent cardiomegaly. Mild persistent interstitial prominence. Small Bilateral pleural effusions cannot be excluded. Findings suggest mild persistent congestive heart failure. IMPRESSION: 1. Lines and tubes stable position. 2. Persistent cardiomegaly with mild bilateral interstitial prominence and spot possible small pleural effusions. Findings consistent with mild persistent congestive heart failure . Electronically Signed   By: Marcello Moores  Register   On: 08/14/2015 07:25   Dg Chest Port 1 View  08/13/2015  CLINICAL DATA:  Assess nasogastric tube placement EXAM: PORTABLE CHEST 1 VIEW COMPARISON:  None and PACs FINDINGS: Portable chest x-ray: The lungs are adequately inflated. There is persistent increased density in the left lung base laterally with partial obscuration of the hemidiaphragm. The pulmonary interstitial markings remain increased. The cardiac silhouette remains enlarged and the central pulmonary vascularity is engorged. The endotracheal tube tip lies 3.5 cm above the carina. The esophagogastric tube tip projects below the inferior margin of the image and is discussed below. Portable supine AP view of the abdomen: The esophagogastric tube tip in proximal per jet port project in the gastric body. They are below the expected location of the GE junction. The bowel gas pattern is unremarkable. IMPRESSION: 1. Persistent left lower lobe atelectasis and small left pleural effusion. Stable mild low-grade CHF. The endotracheal tube is in reasonable position. 2. The nasogastric tube tip and proximal port lie in the region of the gastric body below the expected  location of the GE junction. Electronically Signed   By: David  Martinique M.D.   On: 08/13/2015 07:22   Dg Abd Portable 1v  08/13/2015  CLINICAL DATA:  Assess nasogastric tube placement EXAM: PORTABLE CHEST 1 VIEW COMPARISON:  None and PACs FINDINGS: Portable chest x-ray: The lungs are adequately inflated. There is persistent increased density in the left lung base laterally with partial obscuration of the hemidiaphragm. The pulmonary interstitial markings remain increased. The cardiac silhouette remains enlarged and the central pulmonary vascularity is engorged. The endotracheal tube tip lies 3.5 cm above the carina. The esophagogastric tube tip projects below the inferior margin of the image and is discussed below. Portable supine AP view of the abdomen: The esophagogastric tube tip in proximal per jet port project in the gastric body. They are below the expected location of the GE junction. The bowel gas pattern is unremarkable. IMPRESSION: 1. Persistent left lower lobe atelectasis and small left pleural effusion. Stable mild low-grade CHF. The  endotracheal tube is in reasonable position. 2. The nasogastric tube tip and proximal port lie in the region of the gastric body below the expected location of the GE junction. Electronically Signed   By: David  Martinique M.D.   On: 08/13/2015 07:22   Dg Fluoro Guide Lumbar Puncture  08/12/2015  CLINICAL DATA:  Acute and subacute stroke. Possible meningitis. Failed lumbar puncture on the floor. EXAM: DIAGNOSTIC LUMBAR PUNCTURE UNDER FLUOROSCOPIC GUIDANCE FLUOROSCOPY TIME:  Radiation Exposure Index (as provided by the fluoroscopic device): 332 microGy*m^2 PROCEDURE: I discussed the risks (including hemorrhage, infection, headache, and nerve damage, among others), benefits, and alternatives to fluoroscopically guided lumbar puncture with the patient's husband by telephone consent. The patient is intubated and not able to provide consent herself. We specifically discussed the  technical likelihood of success of the procedure. The patient's husband understood and elected for the patient to undergo the procedure. Standard time-out was employed. Respiratory therapy and assist personnel were present. The patient was turned into the prone position with careful attention to maintaining the endotracheal tube. Following sterile skin prep and local anesthetic administration consisting of 1 percent lidocaine, a 22 gauge 5 inch spinal needle was advanced into the thecal sac at the at the L3-4 level. Frankly bloody fluid was returned. Opening pressure was not obtained due to the difficulty in turning the patient. 3 cc of bloody CSF was collected. This did not clear. There was observed clotting. Because of plugging of the needle and tubing, I removed the needle and advanced a second needle at L3-4 into the spinal canal. Once again I felt the loss of resistance of the epidural space and pop of presumed dural penetration. Bloody fluid flowed under low pressure reminiscent of CSF pressure. I obtained another 4 cc of bloody fluid. The needle was subsequently removed and the skin cleansed and bandaged. No immediate complications were observed. IMPRESSION: 1. With needle positioning in the spinal canal, bloody fluid was returned and at least the first tube seemed to clot, and the bloody appearance did not clear. This would be atypical for CSF blood from subarachnoid hemorrhage, which will typically not clot. I speculate that there may be dilated epidural venous plexus which was sampled rather than actual CSF (unsuccessful lumbar puncture). Attempt at new positioning with a second needle (also at the L3-4 level) yielded the same result. Needle positioning was straightforward, well visualized, and relatively atraumatic with both advancements. Given the reduced ability to assess for neurologic signs of epidural hematoma due to the ventilation and clinical condition, a low threshold for lumbar MRI should be  considered. Lumbar MRI would also allow [place] to assess the best location for lumbar puncture, for example if the patient has prominent epidural adipose tissue. I discussed these impressions by telephone with Dr. Erlinda Hong at 12:55 PM on 06/12/2015. Electronically Signed   By: Van Clines M.D.   On: 08/12/2015 13:00   Medications . sodium chloride 250 mL (08/14/15 0828)  . propofol (DIPRIVAN) infusion Stopped (08/14/15 0828)   . amLODipine  10 mg Per Tube q morning - 10a  . antiseptic oral rinse  7 mL Mouth Rinse 10 times per day  . aspirin  325 mg Per Tube Daily  . atorvastatin  80 mg Per Tube QPM  . cefTAZidime (FORTAZ)  IV  2 g Intravenous Q24H  . chlorhexidine gluconate  15 mL Mouth Rinse BID  . feeding supplement (PRO-STAT SUGAR FREE 64)  30 mL Per Tube BID  . feeding supplement (VITAL HIGH  PROTEIN)  1,000 mL Per Tube Q24H  . fluconazole (DIFLUCAN) IV  50 mg Intravenous Q24H  . heparin subcutaneous  5,000 Units Subcutaneous 3 times per day  . insulin aspart  0-9 Units Subcutaneous 6 times per day  . levothyroxine  100 mcg Per Tube QAC breakfast  . metoprolol tartrate  25 mg Oral BID  . pantoprazole sodium  40 mg Per Tube Daily  . phenytoin (DILANTIN) IV  100 mg Intravenous 3 times per day  . vancomycin  1,500 mg Intravenous Q48H   Background: 69 y.o. year-old 69 yo female with background hx of DM, HTN, TIA, hypothyroidism, neuropathy, aortic stenosis, CAD, GERD, A fib, Rt foot osteomyelitis. She was admitted to [facility] after presenting with HA, recent fevers to 102, fall at home, possible sz, acute encephalopathy with findings of acute bilateral embolic infarcts on imaging studies. Had VF arrest requiring defib X 3. Developed septic shock (felt 2/2 PNA vs viral syndrome) requiring pressors, VDRF. She was transferred here for further management. We are asked to see b/o AKI. Creatinine on admission to [facility] was 0.75 on 08/10/15, up to 2.7 on 12/4 day of transfer->3.9  on arrival here 12/4 and has continued to rise - up to 5.44 12/6  with minimal UOP. She was on benazepril prior to admission. Received CT scans at [facility] on 12/3 but I don't believe any contrast was administered. UA there showed >300 protein, large blood. US shows large kidneys (c/w DM). UPC 1.6 gm proteinuria (c/w DM) 6-30 RBC foley specimen.    Impression/Plan 1. AKI - normal renal function at baseline, but UA at outside hospital with proteinuria so likely underlying diabetic nephropathy. I suspect ATN as proximate cause for her AKI (shock/sepsis/arrest/ACE on board prior to presentation). CK normal so no rhabdo. No obstruction on [place], kidneys large (would be c/w DM; 1.6 gm proteinuria also fits).  Of course cannot r/o possibility emboli to her kidneys. Rate of rise of creatinine is clearly slowing down and she is making urine in response to high dose lasix. Still no emerging need for dialysis but still may need at some point. Would recommend continuing the lasix for now - I ordered for Q8H  Acidosis corrected with bicarb drip which is now off. Will need to dose vanco carefully by levels with rising creatinine.  2. Acute hypoxic resp failure/VDRF 3. Volume overload with interstitial edema and pleural effusions. Diuretics hopefully will effect neg balance - lasix response pretty good considering creatinine of 6.  4. Sepsis - amp/fortaz/vanco/diflucan - amp d/c'd today 5. HTN - meds per CCM 6. Bilateral cerebral emboli with sz - dilantin 7. DM2 8. AF   Jamal Maes, MD [place] Pager 08/14/2015, 11:22 AM

## 2015-08-14 NOTE — Progress Notes (Signed)
STROKE TEAM PROGRESS NOTE   SUBJECTIVE (INTERVAL HISTORY) Her RN and husband are at the bedside. She is as wake as yesterday and also has some tracking on objects, not on sedation. Eyes open on voice. Still intubated. Afebrile and WBC within normal range. Cre continue to be worse and likely needs HD.    OBJECTIVE Temp:  [97.7 F (36.5 C)-98.6 F (37 C)] 98.3 F (36.8 C) (12/07 1600) Pulse Rate:  [63-86] 68 (12/07 1630) Cardiac Rhythm:  [-] Normal sinus rhythm (12/07 0900) Resp:  [16-23] 18 (12/07 1630) BP: (110-190)/(56-82) 148/63 mmHg (12/07 1630) SpO2:  [93 %-100 %] 97 % (12/07 1630) FiO2 (%):  [30 %] 30 % (12/07 1536) Weight:  [254 lb 13.6 oz (115.6 kg)] 254 lb 13.6 oz (115.6 kg) (12/07 0751)  CBC:   Recent Labs Lab 08/13/15 0238 08/14/15 0335  WBC 9.8 8.9  HGB 9.1* 9.0*  HCT 30.4* 30.1*  MCV 89.9 88.5  PLT 163 [id number]    Basic Metabolic Panel:   Recent Labs Lab 08/13/15 0238 08/14/15 0335  NA 143 142  K 4.0 3.6  CL 108 105  CO2 22 22  GLUCOSE 185* 194*  BUN 60* 76*  CREATININE 5.44* 6.06*  CALCIUM 7.1* 7.1*  MG 1.5* 2.0  PHOS 7.6* 7.9*    Lipid Panel:     Component Value Date/Time   CHOL 127 08/13/2015 0238   TRIG 191* 08/13/2015 0238   TRIG 195* 08/13/2015 0238   HDL 28* 08/13/2015 0238   CHOLHDL 4.5 08/13/2015 0238   VLDL 39 08/13/2015 0238   LDLCALC 60 08/13/2015 0238   HgbA1c:  Lab Results  Component Value Date   HGBA1C 7.4* 08/12/2015   Urine Drug Screen:     Component Value Date/Time   LABOPIA POSITIVE* 08/12/2015 1430   COCAINSCRNUR NONE DETECTED 08/12/2015 1430   LABBENZ POSITIVE* 08/12/2015 1430   AMPHETMU NONE DETECTED 08/12/2015 1430   THCU NONE DETECTED 08/12/2015 1430   LABBARB NONE DETECTED 08/12/2015 1430      IMAGING I have personally reviewed the radiological images below and agree with the radiology interpretations.  CT HEAD 09/07/2015   Acute moderate RIGHT parietal occipital infarct (watershed versus angular branch of  the MCA). Small acute RIGHT cerebellar infarct. Small area RIGHT frontal encephalomalacia may be posttraumatic or ischemic.   CT CERVICAL SPINE 08/19/2015  No acute cervical spine fracture or malalignment.   US Abdomen Complete 08/13/2015  1. Status post cholecystectomy. No acute abnormality seen within the abdomen. 2. Kidneys unremarkable in appearance. 3. Scattered calcific atherosclerotic disease along the abdominal aorta.    Dg Shoulder Left Port 08/12/2015  No fracture or dislocation.    MRI HEAD 08/12/2015  Exam is motion degraded. Numerous bilateral acute/subacute nonhemorrhagic infarcts. Most confluent infarcts have an appearance of acute/subacute infarct involves portions of the right frontal -parietal - occipital and posterior right temporal lobe. Swelling of gyri without significant local mass effect. Smaller acute nonhemorrhagic infarcts scattered throughout the hemispheres bilaterally (frontal lobes, parietal lobes, occipital lobes), right temporal lobe/subinsular region and cerebellum bilaterally. The involvement of multiple vascular distributions raises possibility of embolic disease. Remote small right cerebellar infarct.  MRA HEAD Exam is slightly motion degraded. Mild irregularity and slight narrowing cavernous segment right internal carotid artery. Small bulge may be related to atherosclerotic type changes rather than aneurysm. No significant stenosis of either carotid terminus or M1 segment of either middle cerebral artery. Middle cerebral artery branch vessel irregularity and narrowing bilaterally. Mild narrowing and irregularity  A1 segment right anterior cerebral artery. Bulge of the distal A1 segment of the right anterior cerebral artery appears to be origin of a vessel rather than saccular aneurysm. Mild to moderate narrowing A2 segment right anterior cerebral artery. Left vertebral artery is dominant. Mild to moderate narrowing distal right vertebral artery. Mild to moderate  narrowing portions of the posterior inferior cerebellar artery bilaterally. Mild to moderate narrowing portions of the anterior inferior cerebellar artery bilaterally. Narrowing of the superior cerebellar artery more notable on the left. Mild to moderate narrowing portions of the proximal, mid and distal aspect of the posterior cerebral artery bilaterally. Tiny bulge superior margin P1 segments left posterior cerebral artery. Question tiny aneurysm (less than 2 mm). A vessel may rise from this region.   CUS - Bilateral: 1-39% ICA stenosis. Vertebral artery flow is antegrade.  2D echo - Normal LV size with EF 50%, diffuse hypokinesis. Mildly dilated RV with normal systolic function. Biatrial enlargement. Moderate aortic stenosis. Dilated IVC suggests elevated RV filling pressure. Mild pulmonary hypertension.  Mr Lumbar Spine Wo Contrast  08/12/2015  IMPRESSION: No complications seen following unsuccessful lumbar puncture. Mild multifactorial spinal stenosis at L2-3 and L3-4 could have contributed to the difficulty at at lumbar puncture. Degenerative facet arthropathy at L3-4 and L4-5. Should a lumbar puncture be repeated, greater likelihood of success might be obtained at the L4-5 or L5-S1 level.  Dg Chest Port 1 View  08/13/2015  IMPRESSION: 1. Persistent left lower lobe atelectasis and small left pleural effusion. Stable mild low-grade CHF. The endotracheal tube is in reasonable position. 2. The nasogastric tube tip and proximal port lie in the region of the gastric body below the expected location of the GE junction.   EEG - This EEG is abnormal with severe generalized continuous nonspecific slowing cerebral activity. This pattern of slowing can be seen with metabolic and toxic encephalopathies, as well as with severe degenerative central nervous system disorders. No evidence of epileptiform activity was recorded.  Physical exam  Temp:  [97.7 F (36.5 C)-98.6 F (37 C)] 98.3 F (36.8 C)  (12/07 1600) Pulse Rate:  [63-86] 68 (12/07 1630) Resp:  [16-23] 18 (12/07 1630) BP: (110-190)/(56-82) 148/63 mmHg (12/07 1630) SpO2:  [93 %-100 %] 97 % (12/07 1630) FiO2 (%):  [30 %] 30 % (12/07 1536) Weight:  [254 lb 13.6 oz (115.6 kg)] 254 lb 13.6 oz (115.6 kg) (12/07 0751)  General - Well nourished, well developed, intubated and sedated.  Ophthalmologic - Fundi not visualized due to agitation.  Cardiovascular - Regular rhythm and rate.  Neuro - intubated but eyes open on voice and able to track objects intermittently, not following commands. PERRL, doll's eyes present, b/l corneal weak reflex but R stronger blinking than left, gag present, breathing over the vent, BLEs withdraw on pain stimulation, BLE 0/5 on pain stimulation, DTR 1+ throughout, no babinski bilaterally.   ASSESSMENT/PLAN Melissa Becker is a 69 y.o. female with history of DM, HTN, CAD, atrial fibrillation, TIA, GI bleed presenting with complaints of a HA who fell at home due to weakness who developed seizures followed by v fib/cardiac arrest at an outlying hospital. She was intubated and transferred to done where she was found to have a R parietal occipital infarct and small R cerebellar infarct. She did not receive IV t-PA due to unknown time last known well.   Stroke:  bilateral anterior and posterior, infra and supratentorial infarcts, embolic secondary to known atrial fibrillation not on AC  Resultant  AMS, seizure and BUE plegia  MRI  bilateral anterior and posterior, infra and supratentorial infarcts, largest at right temporal lobe at MCA distribution  MRA diffuse intracranial atherosclerosis, ? Tiny L PCA aneurysm  Carotid Doppler  Unremarkable   2D Echo  EF 50%  LDL 60  HgbA1c 7.4  Heparin 5000 units sq tid for VTE prophylaxis Diet NPO time specified  No antithrombotic prior to admission, now on ASA 325mg  for stroke prevention. No AC at this time due to risk of hemorrhagic transformation in the  setting of large infarcts at right temporal lobe. Will consider AC in 5-7 days post stroke.   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  Vent dependent respiratory failure  Intubated  CCM managing  Atrial Fibrillation  Home anticoagulation:  No antithrombotics at home  CHA2DS2-VASc Score = 6, ?2 oral anticoagulation recommended  Age in Years:  67-74   +1   Sex:  Female   +1    Hypertension History: yes   +1     Diabetes Mellitus:  yes   +1   Congestive Heart Failure History:  0  Vascular Disease History:  0     Stroke/TIA/Thromboembolism History: yes   +2  On ASA for now. Hold off AC for 5-7 days to avoid hemorrhagic transformation.    Hypertension  Stable Permissive hypertension (OK if < 220/120) but gradually normalize in 5-7 days  Hyperlipidemia  Home meds:  lipitor 80 resumed in hospital  LDL 60, goal < 70  Continue statin at discharge  Diabetes type II  HgbA1c 7.4, goal < 7.0  Not controlled  SSI  Other Stroke Risk Factors  Advanced age  Former Cigarette smoker, quit smoking 22 years ago   Morbid Obesity, Body mass index is 41.15 kg/(m^2).   Hx stroke/TIA  Coronary artery disease  Aortic stenosis  Other Active  Problems  Hypothyroid  Osteomyelitis R foot  Hospital day # 3  This patient is critically ill due to multifocal emboli stroke, seizure, respiratory failure, sepsis and at significant risk of neurological worsening, death form recurrent stroke, cerebral edema, brain herniation, heart failure, septic shock. This patient's care requires constant monitoring of vital signs, hemodynamics, respiratory and cardiac monitoring, review of multiple databases, neurological assessment, discussion with family, other specialists and medical decision making of high complexity. I spent 35 minutes of neurocritical care time in the care of this patient.  Rosalin Hawking, MD PhD Stroke Neurology 08/14/2015 5:11 PM    To contact Stroke Continuity provider, please refer to [email or web address] After hours, contact General Neurology

## 2015-08-15 ENCOUNTER — Inpatient Hospital Stay (HOSPITAL_COMMUNITY): Payer: Medicare Other

## 2015-08-15 DIAGNOSIS — I480 Paroxysmal atrial fibrillation: Secondary | ICD-10-CM

## 2015-08-15 DIAGNOSIS — G039 Meningitis, unspecified: Secondary | ICD-10-CM

## 2015-08-15 LAB — PROTEIN, CSF: TOTAL PROTEIN, CSF: 34 mg/dL (ref 15–45)

## 2015-08-15 LAB — CBC
HCT: 32.6 % — ABNORMAL LOW (ref 36.0–46.0)
Hemoglobin: 9.9 g/dL — ABNORMAL LOW (ref 12.0–15.0)
MCH: 26.6 pg (ref 26.0–34.0)
MCHC: 30.4 g/dL (ref 30.0–36.0)
MCV: 87.6 fL (ref 78.0–100.0)
PLATELETS: 184 10*3/uL (ref 150–400)
RBC: 3.72 MIL/uL — ABNORMAL LOW (ref 3.87–5.11)
RDW: 15.5 % (ref 11.5–15.5)
WBC: 8.9 10*3/uL (ref 4.0–10.5)

## 2015-08-15 LAB — GLUCOSE, CAPILLARY
GLUCOSE-CAPILLARY: 263 mg/dL — AB (ref 65–99)
GLUCOSE-CAPILLARY: 276 mg/dL — AB (ref 65–99)
Glucose-Capillary: 240 mg/dL — ABNORMAL HIGH (ref 65–99)
Glucose-Capillary: 264 mg/dL — ABNORMAL HIGH (ref 65–99)
Glucose-Capillary: 265 mg/dL — ABNORMAL HIGH (ref 65–99)
Glucose-Capillary: 285 mg/dL — ABNORMAL HIGH (ref 65–99)

## 2015-08-15 LAB — RENAL FUNCTION PANEL
ALBUMIN: 2.1 g/dL — AB (ref 3.5–5.0)
Anion gap: 18 — ABNORMAL HIGH (ref 5–15)
BUN: 96 mg/dL — AB (ref 6–20)
CALCIUM: 7.9 mg/dL — AB (ref 8.9–10.3)
CO2: 24 mmol/L (ref 22–32)
CREATININE: 6.71 mg/dL — AB (ref 0.44–1.00)
Chloride: 101 mmol/L (ref 101–111)
GFR calc Af Amer: 6 mL/min — ABNORMAL LOW (ref >60)
GFR, EST NON AFRICAN AMERICAN: 6 mL/min — AB (ref >60)
Glucose, Bld: 282 mg/dL — ABNORMAL HIGH (ref 65–99)
PHOSPHORUS: 9.2 mg/dL — AB (ref 2.5–4.6)
Potassium: 3.6 mmol/L (ref 3.5–5.1)
SODIUM: 143 mmol/L (ref 135–145)

## 2015-08-15 LAB — PHENYTOIN LEVEL, TOTAL: Phenytoin Lvl: 5.6 ug/mL — ABNORMAL LOW (ref 10.0–20.0)

## 2015-08-15 LAB — CSF CELL COUNT WITH DIFFERENTIAL
RBC Count, CSF: 34 /mm3 — ABNORMAL HIGH
TUBE #: 3
WBC, CSF: 4 /mm3 (ref 0–5)

## 2015-08-15 LAB — GLUCOSE, CSF: Glucose, CSF: 162 mg/dL — ABNORMAL HIGH (ref 40–70)

## 2015-08-15 MED ORDER — POTASSIUM CHLORIDE 10 MEQ/100ML IV SOLN
10.0000 meq | INTRAVENOUS | Status: AC
  Administered 2015-08-15 (×2): 10 meq via INTRAVENOUS
  Filled 2015-08-15 (×2): qty 100

## 2015-08-15 NOTE — Progress Notes (Signed)
[facility] Rounding Note Subjective:   Excellent UOP with lasix despite continued rise in creatinine - 4.4 liters/24 hours Creatinine up to 3.6.  Objective:    Vital signs in last 24 hours: Filed Vitals:   08/15/15 0600 08/15/15 0700 08/15/15 0800 08/15/15 0807  BP: 169/68 184/49 180/59   Pulse: 62 53 43   Temp:    98.5 F (36.9 C)  TempSrc:    Oral  Resp: 21 18 20    Height:      Weight:      SpO2: 100% 100% 97%    Weight change:   Intake/Output Summary (Last 24 hours) at 08/15/15 0856 Last data filed at 08/15/15 0800  Gross per 24 hour  Intake   1593 ml  Output   4885 ml  Net  -3292 ml    Physical Exam:  Blood pressure 180/59, pulse 43, temperature 98.5 F (36.9 C), temperature source Oral, resp. rate 20, height 5\' 6"  (1.676 m), weight 113.4 kg (250 lb), SpO2 97 %. Gen: Intubated, pale WF  Skin: Bruising noted, ecchymoses arms.  Arm edema less  Neck: Cannot see neck veins Chest: Crackles bases/ant fairly clear Heart: Distant heart sounds.  Irreg S1S2 No S3 1/7 murmur USB No diastolic murmur Abdomen: soft, obese, no masses felt + BS Ext: Pitting edema of arms,less so LE's  Labs:   Recent Labs Lab 08/27/2015 2010 08/12/15 0635 08/13/15 0238 08/14/15 0335 08/15/15 0303  NA 142 142 143 142 143  K 4.3 4.3 4.0 3.6 3.6  CL 109 111 108 105 101  CO2 20* 16* 22 22 24   GLUCOSE 187* 171* 185* 194* 282*  BUN 45* 49* 60* 76* 96*  CREATININE 3.91* 4.26* 5.44* 6.06* 6.71*  CALCIUM 7.3* 7.0* 7.1* 7.1* 7.9*  PHOS 7.4* 7.1* 7.6* 7.9* 9.2*     Recent Labs Lab 09/05/2015 2010 08/13/15 0238 08/14/15 0335 08/15/15 0303  AST 51* 26  --   --   ALT 78* 52  --   --   ALKPHOS 60 54  --   --   BILITOT 0.5 0.5  --   --   PROT 6.2* 5.8*  --   --   ALBUMIN 2.7* 2.4* 2.1* 2.1*    Recent Labs Lab 08/13/2015 2010  LIPASE 36  AMYLASE 54     Recent Labs Lab 08/12/15 0635 08/13/15 0238 08/14/15 0335 08/15/15 0303  WBC 13.5* 9.8 8.9 8.9  HGB 9.1*  9.1* 9.0* 9.9*  HCT 30.1* 30.4* 30.1* 32.6*  MCV 90.9 89.9 88.5 87.6  PLT 183 163 177 184     Recent Labs Lab 08/25/2015 2010 08/12/15 0055 08/12/15 0635 08/13/15 0238  CKTOTAL  --   --   --  134  TROPONINI 4.62* 3.89* 2.48*  --      Recent Labs Lab 08/14/15 1616 08/14/15 2015 08/14/15 2332 08/15/15 0336 08/15/15 0807  GLUCAP 244* 246* 265* 240* 285*     Studies/Results: Dg Chest Port 1 View  08/15/2015  CLINICAL DATA:  69 year old female status post cardiac arrest, seizures, found have bilateral cerebral infarcts, sepsis. Initial encounter. EXAM: PORTABLE CHEST 1 VIEW COMPARISON:  08/14/2015 and earlier. FINDINGS: The patient remains significantly rotated to the left. Two Portable AP semi upright views at 0453 hours. Endotracheal tube tip at the level the clavicles. Enteric tube courses to the abdomen. Side hole at the level of the gastric cardia. Increased since 08/19/2015 and moderate size left pleural effusion. Dense left lung base opacification. No pneumothorax. Patchy  opacity at the right lung base has mildly increased over the same time. Grossly stable mediastinal contours. IMPRESSION: 1.  Stable lines and tubes. 2. Moderate volume left pleural effusion has progressed since 08/27/2015. 3. Mild patchy opacity at the right lung base is new since that time. Electronically Signed   By: Genevie Ann M.D.   On: 08/15/2015 07:14   Dg Chest Port 1 View  08/14/2015  CLINICAL DATA:  Respiratory failure. EXAM: PORTABLE CHEST 1 VIEW COMPARISON:  08/13/2015. FINDINGS: Endotracheal tube and NG tube in stable position. Persistent cardiomegaly. Mild persistent interstitial prominence. Small Bilateral pleural effusions cannot be excluded. Findings suggest mild persistent congestive heart failure. IMPRESSION: 1. Lines and tubes stable position. 2. Persistent cardiomegaly with mild bilateral interstitial prominence and spot possible small pleural effusions. Findings consistent with mild persistent  congestive heart failure . Electronically Signed   By: Marcello Moores  Register   On: 08/14/2015 07:25   Medications . sodium chloride 250 mL (08/14/15 0828)  . propofol (DIPRIVAN) infusion Stopped (08/14/15 0828)   . amLODipine  10 mg Per Tube q morning - 10a  . antiseptic oral rinse  7 mL Mouth Rinse 10 times per day  . aspirin  325 mg Per Tube Daily  . atorvastatin  80 mg Per Tube QPM  . cefTAZidime (FORTAZ)  IV  2 g Intravenous Q24H  . chlorhexidine gluconate  15 mL Mouth Rinse BID  . feeding supplement (PRO-STAT SUGAR FREE 64)  30 mL Per Tube BID  . feeding supplement (VITAL HIGH PROTEIN)  1,000 mL Per Tube Q24H  . fluconazole (DIFLUCAN) IV  50 mg Intravenous Q24H  . furosemide  160 mg Intravenous Q8H  . heparin subcutaneous  5,000 Units Subcutaneous 3 times per day  . insulin aspart  0-9 Units Subcutaneous 6 times per day  . levothyroxine  100 mcg Per Tube QAC breakfast  . metoprolol tartrate  25 mg Oral BID  . pantoprazole sodium  40 mg Per Tube Daily  . phenytoin (DILANTIN) IV  100 mg Intravenous 3 times per day  . vancomycin  1,500 mg Intravenous Q48H   Background: 69 y.o. year-old 69 yo female with background hx of DM, HTN, TIA, hypothyroidism, neuropathy, aortic stenosis, CAD, GERD, A fib, Rt foot osteomyelitis. She was admitted to [facility] after presenting with HA, recent fevers to 102, fall at home, possible sz, acute encephalopathy with findings of acute bilateral embolic infarcts on imaging studies. Had VF arrest requiring defib X 3. Developed septic shock (felt 2/2 PNA vs viral syndrome) requiring pressors, VDRF. She was transferred here for further management. We are asked to see b/o AKI. Creatinine on admission to [facility] was 0.75 on 08/10/15, up to 2.7 on 12/4 day of transfer->3.9 on arrival here 12/4 and has continued to rise - up to 5.44 12/6  with minimal UOP. She was on benazepril prior to admission. Received CT scans at [facility] on 12/3 but I don't  believe any contrast was administered. UA there showed >300 protein, large blood. US shows large kidneys (c/w DM). UPC 1.6 gm proteinuria (c/w DM) 6-30 RBC foley specimen.    Impression/Plan 1. AKI - normal renal function at baseline (0.75) , but UA at outside hospital with proteinuria so likely underlying diabetic nephropathy. I suspect ATN as proximate cause for her AKI (shock/sepsis/arrest/ACE on board prior to presentation). CK normal so no rhabdo. No obstruction on [place], kidneys large (would be c/w DM; 1.6 gm proteinuria also fits).  Of course cannot r/o  possibility emboli to her kidneys. Creatinine is still rising as is BUN but her UOP response to lasix is excellent. Still no emerging need for dialysis but still may need at some point. Would recommend continuing the lasix for now - currently ordered for 160 Q8H. 3 liters negative this admission.   2. Acute hypoxic resp failure/VDRF/PNA 3. Volume overload with interstitial edema and pleural effusions. Diuretics effecting neg balance 4. Sepsis - fortaz/vanco/diflucan  5. HTN - meds per CCM 6. Bilateral cerebral emboli with sz - dilantin 7. DM2 8. AF   Jamal Maes, MD [facility] [phone] Pager 08/15/2015, 8:56 AM

## 2015-08-15 NOTE — Progress Notes (Signed)
PULMONARY / CRITICAL CARE MEDICINE   Name: Melissa Becker MRN: [id number] DOB: 23-Nov-1945    ADMISSION DATE:  08/19/2015  REFERRING MD:  Anselmo Pickler, D.O. [facility])  Brief:  Admitted on 12/4 after a cardiac arrest and seizure, found to have an ischemic stroke.  Remains intubated.  SUBJECTIVE:  Making urine Not moving legs Afebrile. Off pressors   VITAL SIGNS: BP 181/43 mmHg  Pulse 62  Temp(Src) 98.5 F (36.9 C) (Oral)  Resp 21  Ht 5\' 6"  (1.676 m)  Wt 113.4 kg (250 lb)  BMI 40.37 kg/m2  SpO2 97%  HEMODYNAMICS:    VENTILATOR SETTINGS: Vent Mode:  [-] PSV;CPAP FiO2 (%):  [30 %] 30 % Set Rate:  [16 bmp] 16 bmp Vt Set:  [550 mL] 550 mL PEEP:  [5 cmH20] 5 cmH20 Pressure Support:  [10 cmH20] 10 cmH20 Plateau Pressure:  [16 cmH20-19 cmH20] 18 cmH20  INTAKE / OUTPUT: I/O last 3 completed shifts: In: 3752.6 [I.V.:1071.6; NG/GT:1665; IV [id number] Out: [id number] [Urine:6035; Stool:400]  PHYSICAL EXAMINATION:  Gen: chronically ill appearing on vent HENT: OP clear, ETT in place PULM: CTA B, vent supported breaths CV: RRR, no mgr, trace edema GI: BS+, soft, nontender Derm: no cyanosis or rash Neuro: opens eyes to voice but minimal response to pain, does not follow commands    LABS; CBC Recent Labs     08/13/15  0238  08/14/15  0335  08/15/15  0303  WBC  9.8  8.9  8.9  HGB  9.1*  9.0*  9.9*  HCT  30.4*  30.1*  32.6*  PLT  163  177  184    Coag's No results for input(s): APTT, INR in the last 72 hours.  BMET Recent Labs     08/13/15  0238  08/14/15  0335  08/15/15  0303  NA  143  142  143  K  4.0  3.6  3.6  CL  108  105  101  CO2  22  22  24   BUN  60*  76*  96*  CREATININE  5.44*  6.06*  6.71*  GLUCOSE  185*  194*  282*    Electrolytes Recent Labs     08/13/15  0238  08/14/15  0335  08/15/15  0303  CALCIUM  7.1*  7.1*  7.9*  MG  1.5*  2.0   --   PHOS  7.6*  7.9*  9.2*    Sepsis Markers Recent Labs     08/13/15  0238   PROCALCITON  12.60    ABG Recent Labs     08/13/15  0405  PHART  7.367  PCO2ART  37.1  PO2ART  111*    Liver Enzymes Recent Labs     08/13/15  0238  08/14/15  0335  08/15/15  0303  AST  26   --    --   ALT  52   --    --   ALKPHOS  54   --    --   BILITOT  0.5   --    --   ALBUMIN  2.4*  2.1*  2.1*    Cardiac Enzymes No results for input(s): TROPONINI, PROBNP in the last 72 hours.  Glucose Recent Labs     08/14/15  1119  08/14/15  1616  08/14/15  2015  08/14/15  2332  08/15/15  0336  08/15/15  0807  GLUCAP  231*  244*  246*  265*  240*  285*  Imaging Dg Chest Port 1 View  08/15/2015  CLINICAL DATA:  69 year old female status post cardiac arrest, seizures, found have bilateral cerebral infarcts, sepsis. Initial encounter. EXAM: PORTABLE CHEST 1 VIEW COMPARISON:  08/14/2015 and earlier. FINDINGS: The patient remains significantly rotated to the left. Two Portable AP semi upright views at 0453 hours. Endotracheal tube tip at the level the clavicles. Enteric tube courses to the abdomen. Side hole at the level of the gastric cardia. Increased since 09/04/2015 and moderate size left pleural effusion. Dense left lung base opacification. No pneumothorax. Patchy opacity at the right lung base has mildly increased over the same time. Grossly stable mediastinal contours. IMPRESSION: 1.  Stable lines and tubes. 2. Moderate volume left pleural effusion has progressed since 08/10/2015. 3. Mild patchy opacity at the right lung base is new since that time. Electronically Signed   By: Genevie Ann M.D.   On: 08/15/2015 07:14   Dg Chest Port 1 View  08/14/2015  CLINICAL DATA:  Respiratory failure. EXAM: PORTABLE CHEST 1 VIEW COMPARISON:  08/13/2015. FINDINGS: Endotracheal tube and NG tube in stable position. Persistent cardiomegaly. Mild persistent interstitial prominence. Small Bilateral pleural effusions cannot be excluded. Findings suggest mild persistent congestive heart failure.  IMPRESSION: 1. Lines and tubes stable position. 2. Persistent cardiomegaly with mild bilateral interstitial prominence and spot possible small pleural effusions. Findings consistent with mild persistent congestive heart failure . Electronically Signed   By: Marcello Moores  Register   On: 08/14/2015 07:25       STUDIES:  12/03 CT head ([place]) >> atrophy 12/04 CT head >> acute Rt parietal occipital infarct, Rt cerebellar infarct 12/04 U/S Abd >> unremarkable appearance of kidneys 12/05 MRI/MRA brain >> numerous b/l infarcts concerning for embolic infarcts [id number] Echo >> EF 50%, diffuse hypokinesis, grade 1 diastolic dysfx, mod AS, mod LA dilation 12/05 EEG >> generalized slowing 12/05 MRI lumbar spine >> L2-3, L3-4 spinal stenosis, degenerative arthropathy L3-4, L4-5 12/8 MRI brain >>   CULTURES: 12/03 Blood ([facility]) >> 12/03 Urine ([facility]) >> 12/04 Blood >> 12/04 Respiratory viral panel >> negative 12/04 Pneumococcal Ag >> negative 12/05 CSF >> NGTD 12/06 Legionella Ag >> negative  ANTIBIOTICS: 12/04 Tressie Ellis >> 12/8 12/04 Ampicillin >> 12/07 12/04 Vancomycin >>  12/8 12/06 Diflucan >>12/8  SIGNIFICANT EVENTS: 12/03 Admit to OSH w/ Seizure & Fall 12/04 Transfer to [facility]; weaned off pressors; neurology consulted 12/05 Difficulty with LP due to spinal stenosis >> only cultures sent; add HCO3 to IV fluid 12/06 Nephrology consulted; d/c HCO3 from IV fluid  LINES/TUBES: 12/03 ETT Lovie Macadamia) >>  DISCUSSION: 69 yo female presented to [facility] with 2 weeks of HA, and had developed fever up to 102F.  She had fall at home with Rtward gaze and Lt sided weakness.  She is reported to have seizure at [facility].  There is also report of ventricular fibrillation at [facility] >> ?if this was ECG artifact in setting of seizure.  She has hx of DM, HTN, TIA, Hypothyroidism, Neuropathy, Aortic stenosis, CAD, GERD, A fib, Rt foot osteomyelitis.  12/8 exam worrisome because she is not moving feet,  uncertain etiology.  ASSESSMENT / PLAN:  NEUROLOGIC A:   Acute encephalopathy with status epilepticus likely from acute embolic infarcts Hx of DM neuropathy Could this be explained by HSV? Has viral lesion on lip P:   RASS goal 0 AED's per neurology F/u LP 12/05 Hold outpt cymbalta Send CSF for cell count, viral PCR (re-do 12/8) Repeat MRI 12/8 per neurology  PULMONARY A: Acute  hypoxic respiratory failure and concern for pneumonia >> might have had viral prodrome Interstitial edema with pleural effusions P:   Full vent support F/u CXR Negative fluid balance as tolerated  CARDIOVASCULAR A:  Hx of A fib >> not on anticoagulation as outpt Hx of HTN, CAD, HLD, mod aortic stenosis Septic shock? >> weaned off pressors 12/04 VF arrest at outside hospital >> happened with seizure, might have been ECG artifact in setting of seizure Elevated troponin >> demand ischemia Intermittent bradycardia P:  Monitor hemodynamics Defer cardiology assessment for now until mental status improves Continue norvasc, lipitor, ASA Hold outpt cozaar  RENAL A:   AKI with oliguria >> baseline creatinine 0.8 from August 123456, cause uncertain? Non gap metabolic acidosis >> improved Hypomagnesemia >> improved Hypervolemia Hypokalemia P:   Replace electrolytes as needed Agree with holding off on diuresis Appreciate renal input  GASTROINTESTINAL A:   Nutrition P:   Tube feeds Protonix for SUP  HEMATOLOGIC A:   Anemia of critical illness and chronic disease P:  F/u CBC SCD's, SQ heparin for DVT prevention Monitor for bleeding  INFECTIOUS A:   Septic shock >> likely from pneumonia with possible viral prodrome> resolved Doubt meningitis/encephalitis Chronic Rt foot osteomyelitis Yeast UTI P:   Day 5 fortaz, vancomycin for pneumonia > stop both today Day 3 fluconazole for yeast UTI > stop D/c ampicillin 12/07  Repeat LP on 12/8 > could this be HSV?   ENDOCRINE A:   Hx of DM,  hypothyroidism P:   SSI Hold outpt glimepiride, metformin Continue synthroid  Husband updated bedside by me 12/8  CC time 37 minutes  Roselie Awkward, MD [place] [facility] Pager: [phone] Cell: [phone] After 3pm or if no response, call [phone]

## 2015-08-15 NOTE — Progress Notes (Signed)
STROKE TEAM PROGRESS NOTE   SUBJECTIVE (INTERVAL HISTORY) Her RN and husband are at the bedside. She is as wake as yesterday and also has some tracking on objects, not on sedation. Eyes open on voice. Still intubated. Afebrile and WBC within normal range. Cre mild elevation on high dose lasix. However, pt did not move BLEs on pain stimulation, otherwise, no significant change from yesterday.     OBJECTIVE Temp:  [97.8 F (36.6 C)-99.1 F (37.3 C)] 99.1 F (37.3 C) (12/08 1131) Pulse Rate:  [30-138] 97 (12/08 1113) Cardiac Rhythm:  [-] Heart block (12/08 0803) Resp:  [16-23] 22 (12/08 1113) BP: (135-203)/(43-78) 181/43 mmHg (12/08 1000) SpO2:  [95 %-100 %] 97 % (12/08 1113) FiO2 (%):  [30 %] 30 % (12/08 1113) Weight:  [250 lb (113.4 kg)] 250 lb (113.4 kg) (12/08 0401)  CBC:   Recent Labs Lab 08/14/15 0335 08/15/15 0303  WBC 8.9 8.9  HGB 9.0* 9.9*  HCT 30.1* 32.6*  MCV 88.5 87.6  PLT 177 [id number]    Basic Metabolic Panel:   Recent Labs Lab 08/13/15 0238 08/14/15 0335 08/15/15 0303  NA 143 142 143  K 4.0 3.6 3.6  CL 108 105 101  CO2 22 22 24   GLUCOSE 185* 194* 282*  BUN 60* 76* 96*  CREATININE 5.44* 6.06* 6.71*  CALCIUM 7.1* 7.1* 7.9*  MG 1.5* 2.0  --   PHOS 7.6* 7.9* 9.2*    Lipid Panel:     Component Value Date/Time   CHOL 127 08/13/2015 0238   TRIG 191* 08/13/2015 0238   TRIG 195* 08/13/2015 0238   HDL 28* 08/13/2015 0238   CHOLHDL 4.5 08/13/2015 0238   VLDL 39 08/13/2015 0238   LDLCALC 60 08/13/2015 0238   HgbA1c:  Lab Results  Component Value Date   HGBA1C 7.4* 08/12/2015   Urine Drug Screen:     Component Value Date/Time   LABOPIA POSITIVE* 08/12/2015 1430   COCAINSCRNUR NONE DETECTED 08/12/2015 1430   LABBENZ POSITIVE* 08/12/2015 1430   AMPHETMU NONE DETECTED 08/12/2015 1430   THCU NONE DETECTED 08/12/2015 1430   LABBARB NONE DETECTED 08/12/2015 1430      IMAGING I have personally reviewed the radiological images below and agree with the  radiology interpretations.  CT HEAD 09/04/2015   Acute moderate RIGHT parietal occipital infarct (watershed versus angular branch of the MCA). Small acute RIGHT cerebellar infarct. Small area RIGHT frontal encephalomalacia may be posttraumatic or ischemic.   CT CERVICAL SPINE 08/20/2015  No acute cervical spine fracture or malalignment.   US Abdomen Complete 08/08/2015  1. Status post cholecystectomy. No acute abnormality seen within the abdomen. 2. Kidneys unremarkable in appearance. 3. Scattered calcific atherosclerotic disease along the abdominal aorta.    Dg Shoulder Left Port 08/12/2015  No fracture or dislocation.    MRI HEAD 08/12/2015  Exam is motion degraded. Numerous bilateral acute/subacute nonhemorrhagic infarcts. Most confluent infarcts have an appearance of acute/subacute infarct involves portions of the right frontal -parietal - occipital and posterior right temporal lobe. Swelling of gyri without significant local mass effect. Smaller acute nonhemorrhagic infarcts scattered throughout the hemispheres bilaterally (frontal lobes, parietal lobes, occipital lobes), right temporal lobe/subinsular region and cerebellum bilaterally. The involvement of multiple vascular distributions raises possibility of embolic disease. Remote small right cerebellar infarct.  MRA HEAD Exam is slightly motion degraded. Mild irregularity and slight narrowing cavernous segment right internal carotid artery. Small bulge may be related to atherosclerotic type changes rather than aneurysm. No significant stenosis of either  carotid terminus or M1 segment of either middle cerebral artery. Middle cerebral artery branch vessel irregularity and narrowing bilaterally. Mild narrowing and irregularity A1 segment right anterior cerebral artery. Bulge of the distal A1 segment of the right anterior cerebral artery appears to be origin of a vessel rather than saccular aneurysm. Mild to moderate narrowing A2 segment right  anterior cerebral artery. Left vertebral artery is dominant. Mild to moderate narrowing distal right vertebral artery. Mild to moderate narrowing portions of the posterior inferior cerebellar artery bilaterally. Mild to moderate narrowing portions of the anterior inferior cerebellar artery bilaterally. Narrowing of the superior cerebellar artery more notable on the left. Mild to moderate narrowing portions of the proximal, mid and distal aspect of the posterior cerebral artery bilaterally. Tiny bulge superior margin P1 segments left posterior cerebral artery. Question tiny aneurysm (less than 2 mm). A vessel may rise from this region.   CUS - Bilateral: 1-39% ICA stenosis. Vertebral artery flow is antegrade.  2D echo - Normal LV size with EF 50%, diffuse hypokinesis. Mildly dilated RV with normal systolic function. Biatrial enlargement. Moderate aortic stenosis. Dilated IVC suggests elevated RV filling pressure. Mild pulmonary hypertension.  Mr Lumbar Spine Wo Contrast  08/12/2015  IMPRESSION: No complications seen following unsuccessful lumbar puncture. Mild multifactorial spinal stenosis at L2-3 and L3-4 could have contributed to the difficulty at at lumbar puncture. Degenerative facet arthropathy at L3-4 and L4-5. Should a lumbar puncture be repeated, greater likelihood of success might be obtained at the L4-5 or L5-S1 level.  Dg Chest Port 1 View  08/13/2015  IMPRESSION: 1. Persistent left lower lobe atelectasis and small left pleural effusion. Stable mild low-grade CHF. The endotracheal tube is in reasonable position. 2. The nasogastric tube tip and proximal port lie in the region of the gastric body below the expected location of the GE junction.   EEG - This EEG is abnormal with severe generalized continuous nonspecific slowing cerebral activity. This pattern of slowing can be seen with metabolic and toxic encephalopathies, as well as with severe degenerative central nervous system  disorders. No evidence of epileptiform activity was recorded.  Physical exam  Temp:  [97.8 F (36.6 C)-99.1 F (37.3 C)] 99.1 F (37.3 C) (12/08 1131) Pulse Rate:  [30-138] 97 (12/08 1113) Resp:  [16-23] 22 (12/08 1113) BP: (135-203)/(43-78) 181/43 mmHg (12/08 1000) SpO2:  [95 %-100 %] 97 % (12/08 1113) FiO2 (%):  [30 %] 30 % (12/08 1113) Weight:  [250 lb (113.4 kg)] 250 lb (113.4 kg) (12/08 0401)  General - Well nourished, well developed, intubated and sedated.  Ophthalmologic - Fundi not visualized due to agitation.  Cardiovascular - Regular rhythm and rate.  Neuro - intubated but eyes open on voice and able to track objects intermittently, not following commands. PERRL, doll's eyes present, b/l corneal weak reflex but R stronger blinking than left, gag present, breathing over the vent, BLEs no movement on pain stimulation which is change from yesterday, BLE 0/5 on pain stimulation, DTR 1+ throughout, no babinski bilaterally.   ASSESSMENT/PLAN Ms. Melissa Becker is a 69 y.o. female with history of DM, HTN, CAD, atrial fibrillation, TIA, GI bleed presenting with complaints of a HA who fell at home due to weakness who developed seizures followed by v fib/cardiac arrest at an outlying hospital. She was intubated and transferred to done where she was found to have a R parietal occipital infarct and small R cerebellar infarct. She did not receive IV t-PA due to unknown time last known  well.   Stroke:  bilateral anterior and posterior, infra and supratentorial infarcts, embolic secondary to known atrial fibrillation not on AC  Resultant  AMS, seizure and BUE plegia, new BLE plegia  MRI  bilateral anterior and posterior, infra and supratentorial infarcts, largest at right temporal lobe at MCA distribution  MRA diffuse intracranial atherosclerosis, ? Tiny L PCA aneurysm  Recommend to repeat MRI brain (ordered) due to new BLE plegia  Carotid Doppler  Unremarkable   2D Echo  EF  50%  LDL 60  HgbA1c 7.4  Heparin 5000 units sq tid for VTE prophylaxis Diet NPO time specified  No antithrombotic prior to admission, now on ASA 325mg  for stroke prevention. No AC at this time due to risk of hemorrhagic transformation in the setting of large infarcts at right temporal lobe. Will consider AC in 5-7 days post stroke and if no planned procedures.   Ongoing aggressive stroke risk factor management  Therapy recommendations:  pending  Disposition:  pending  Seizure  Likely secondary to cortical infarcts  Dilantin level low at 6.4 -> 5.6, but corrected level around 20  On dilantin 100mg  tid  EEG no seizure  Dilantin level daily  AKI  CK 134  Cre 3.91 -> 4.26 -> 5.44-> 6.06-> 6.71  On lasix high dose  Nephrology on board  excellent urine outpt   Sepsis, doubt for meningoencephalitis  procalcitonin 19.06 -> 16.71->12.60  afebrile  WBC 13.5 -> 9.8->8.9->8.9  On ceftazidime, and vanco and ampicillin for empirical coverage, now off  MRI lesion within vascular distribution, not consistent with HSV encephalitis   Repeat MRI brain.  Pt afebrile, WBC normolize, clinical better.  LP first try under fluro unsuccessful  Agree with further LP attempt if feels warranted. If try again, recommend entry via L4-5 or L5-S1 as suggested from MRI L-spine.  Vent dependent respiratory failure  Intubated  CCM managing  Atrial Fibrillation  Home anticoagulation:  No antithrombotics at home  CHA2DS2-VASc Score = 6, ?2 oral anticoagulation recommended  Age in Years:  51-74   +1   Sex:  Female   +1    Hypertension History: yes   +1     Diabetes Mellitus:  yes   +1   Congestive Heart Failure History:  0  Vascular Disease History:  0     Stroke/TIA/Thromboembolism History: yes   +2  On ASA for now. Hold off AC for 5-7 days to avoid hemorrhagic transformation and if no planned procedures.    Hypertension  Still elevated likely due to AKI BP goal  normotensive.  Hyperlipidemia  Home meds:  lipitor 80 resumed in hospital  LDL 60, goal < 70  Continue statin at discharge  Diabetes type II  HgbA1c 7.4, goal < 7.0  Not controlled  SSI  Other Stroke Risk Factors  Advanced age  Former Cigarette smoker, quit smoking 22 years ago   Morbid Obesity, Body mass index is 40.37 kg/(m^2).   Hx stroke/TIA  Coronary artery disease  Aortic stenosis  Other Active Problems  Hypothyroid  Osteomyelitis R foot  Hospital day # 4  This patient is critically ill due to multifocal emboli stroke, seizure, respiratory failure, sepsis and at significant risk of neurological worsening, death form recurrent stroke, cerebral edema, brain herniation, heart failure, septic shock. This patient's care requires constant monitoring of vital signs, hemodynamics, respiratory and cardiac monitoring, review of multiple databases, neurological assessment, discussion with family, other specialists and medical decision making of high complexity. I spent 40 minutes  of neurocritical care time in the care of this patient.  Rosalin Hawking, MD PhD Stroke Neurology 08/15/2015 1:08 PM    To contact Stroke Continuity provider, please refer to [email or web address] After hours, contact General Neurology

## 2015-08-15 NOTE — Procedures (Signed)
CLINICAL DATA: [Encephalitis]  EXAM:  DIAGNOSTIC LUMBAR PUNCTURE UNDER FLUOROSCOPIC GUIDANCE  FLUOROSCOPY TIME: Radiation Exposure Index (as provided by the fluoroscopic device):  [228 microGy*m^2]    PROCEDURE    I discussed the risks (including hemorrhage, infection, headache, and nerve damage, among others), benefits, and alternatives to fluoroscopically guided lumbar puncture with the patient's husband by telephone.  We specifically discussed the high technical likelihood of success of the procedure. The patient's husband understood and elected for the patient to undergo the procedure.  I reviewed the MRI lumbar spine from 08/12/2015 in determining a suitable level for puncture.      Standard time-out was employed.  Following sterile skin prep and local anesthetic administration consisting of 1 percent lidocaine, a 20 gauge 5 inch spinal needle was advanced without difficulty into the thecal sac at the at the [L5-S1] level from a slightly were left paramedian approach.  Clear CSF was returned.  Opening pressure was [not obtained -the patient is on a ventilator and turning the patient with the needle in position would be problematic.]     12 cc of clear CSF was collected.  The needle was subsequently removed and the skin cleansed and bandaged.  No immediate complications were observed.       IMPRESSION: [ Successful lumbar puncture at the L5-S1 level yielding 12 cc of clear CSF.   ]

## 2015-08-15 NOTE — Progress Notes (Signed)
Transported PT from unit to MRI, from MRI to IR

## 2015-08-15 NOTE — Progress Notes (Signed)
ANTIBIOTIC CONSULT NOTE - INITIAL  Pharmacy Consult for Acyclovir Indication: r/o HSV encephalitis  Allergies  Allergen Reactions  . Sulfamethoxazole-Trimethoprim Other (See Comments)    Headaches  . Latex Itching    whelps  . Tape     Patient Measurements: Height: 5\' 6"  (167.6 cm) Weight: 250 lb (113.4 kg) IBW/kg (Calculated) : 59.3  Vital Signs: Temp: 98.5 F (36.9 C) (12/08 0807) Temp Source: Oral (12/08 0807) BP: 181/43 mmHg (12/08 1000) Pulse Rate: 62 (12/08 1000) Intake/Output from previous day: 12/07 0701 - 12/08 0700 In: 1721.6 [I.V.:318.6; NG/GT:1080; IV Piggyback:323] Out: [id number]; Stool:250] Intake/Output from this shift: Total I/O In: 231 [I.V.:30; NG/GT:135; IV Piggyback:66] Out: 800 [Urine:800]  Labs:  Recent Labs  08/12/15 1438 08/13/15 0238 08/13/15 1622 08/14/15 0335 08/15/15 0303  WBC  --  9.8  --  8.9 8.9  HGB  --  9.1*  --  9.0* 9.9*  PLT  --  163  --  177 184  LABCREA 149.04  --  68.55  --   --   CREATININE  --  5.44*  --  6.06* 6.71*   Estimated Creatinine Clearance: 10 mL/min (by C-G formula based on Cr of 6.71). No results for input(s): VANCOTROUGH, VANCOPEAK, VANCORANDOM, GENTTROUGH, GENTPEAK, GENTRANDOM, TOBRATROUGH, TOBRAPEAK, TOBRARND, AMIKACINPEAK, AMIKACINTROU, AMIKACIN in the last 72 hours.   Microbiology: Recent Results (from the past 720 hour(s))  MRSA PCR Screening     Status: None   Collection Time: 08/09/2015  6:20 PM  Result Value Ref Range Status   MRSA by PCR NEGATIVE NEGATIVE Final    Comment:        The GeneXpert MRSA Assay (FDA approved for NASAL specimens only), is one component of a comprehensive MRSA colonization surveillance program. It is not intended to diagnose MRSA infection nor to guide or monitor treatment for MRSA infections.   Culture, blood (routine x 2)     Status: None (Preliminary result)   Collection Time: 08/27/2015  7:51 PM  Result Value Ref Range Status   Specimen Description  BLOOD BLOOD RIGHT FOREARM  Final   Special Requests BOTTLES DRAWN AEROBIC AND ANAEROBIC 5CC  Final   Culture NO GROWTH 3 DAYS  Final   Report Status PENDING  Incomplete  Culture, blood (routine x 2)     Status: None (Preliminary result)   Collection Time: 08/08/2015  8:02 PM  Result Value Ref Range Status   Specimen Description BLOOD RIGHT HAND  Final   Special Requests BOTTLES DRAWN AEROBIC ONLY 6CC  Final   Culture NO GROWTH 3 DAYS  Final   Report Status PENDING  Incomplete  Culture, Urine     Status: None   Collection Time: 08/10/2015  8:05 PM  Result Value Ref Range Status   Specimen Description URINE, CATHETERIZED  Final   Special Requests Normal  Final   Culture >=100,000 COLONIES/mL YEAST  Final   Report Status 08/13/2015 FINAL  Final  Respiratory virus panel     Status: None   Collection Time: 08/31/2015 11:30 PM  Result Value Ref Range Status   Source - RVPAN NASOPHARYNGEAL  Corrected   Respiratory Syncytial Virus A Negative Negative Final   Respiratory Syncytial Virus B Negative Negative Final   Influenza A Negative Negative Final   Influenza B Negative Negative Final   Parainfluenza 1 Negative Negative Final   Parainfluenza 2 Negative Negative Final   Parainfluenza 3 Negative Negative Final   Metapneumovirus Negative Negative Final   Rhinovirus Negative Negative Final  Adenovirus Negative Negative Final    Comment: (NOTE) Performed At: [facility] [place], [place] [id number] Lindon Romp MD [id number]   Anaerobic culture     Status: None (Preliminary result)   Collection Time: 08/12/15 12:10 PM  Result Value Ref Range Status   Specimen Description CSF  Final   Special Requests NO2 2CC CSF SPECIMEN IS CLOTTED  Final   Gram Stain   Final    FEW WBC PRESENT, PREDOMINANTLY PMN NO ORGANISMS SEEN    Culture   Final    NO ANAEROBES ISOLATED; CULTURE IN PROGRESS FOR 5 DAYS   Report Status PENDING  Incomplete  CSF culture     Status: None  (Preliminary result)   Collection Time: 08/12/15 12:10 PM  Result Value Ref Range Status   Specimen Description CSF  Final   Special Requests NO2 2CC CSF SPECIMEN CLOTTED  Final   Gram Stain   Final    FEW WBC PRESENT, PREDOMINANTLY PMN NO ORGANISMS SEEN    Culture NO GROWTH 3 DAYS  Final   Report Status PENDING  Incomplete  Fungus Culture with Smear     Status: None (Preliminary result)   Collection Time: 08/12/15 12:10 PM  Result Value Ref Range Status   Specimen Description CSF  Final   Special Requests NONE  Final   Fungal Smear   Final    NO YEAST OR FUNGAL ELEMENTS SEEN Performed at [facility]    Culture   Final    CULTURE IN PROGRESS FOR FOUR WEEKS Performed at [facility]    Report Status PENDING  Incomplete    Medical History: Past Medical History  Diagnosis Date  . Hypothyroid   . Type 2 diabetes mellitus ([place])   . Hypertension   . TIA (transient ischemic attack)     history of TIAs  . Neuropathy ([place])   . Ejection fraction   . Aortic stenosis   . Mitral regurgitation   . Latex allergy     Itching  . GI bleed 2014  . CAD (coronary artery disease)   . GERD (gastroesophageal reflux disease)   . Anemia   . Allergy   . Atrial fibrillation ([place])   . Stroke [facility]) 6 yrs  ago    TIA  . Pneumonia 10-15 years ago  . Osteomyelitis ([facility])     Right toe    Assessment: 69 yo female came from OSH for concern for meningitis. Day #5 of abx for suspected meningitis / aspiration PNA. Also on abx PTA for chronic osteo of R foot (Augmentin) Fluconazole for yeast in urine. Cx's negative from [facility], low likelihood of bacterial meningitis. Afebrile, WBC wnl. Now concern for possible HSV encephalitis. Pharmacy consulted for acyclovir. Dr. Lake Bells would like to hold off on starting acyclovir and await results from CSF due to risk of renal toxicity. IBW is 59.3 kg. Scr continues to trend up to 6.71 (admit SCr was 3.91) CrCl ~5-50ml/min. Still producing lots  of urine with high dose IV lasix  Goal of Therapy:  Resolution of infection  Plan:  Stop abx  Will hold off on starting acyclovir and await results from CSF Continue fluconazole 50mg  IV q24h Monitor clinical picture, renal function F/U CSF cx, LOT  Elenor Quinones, PharmD, [facility] Clinical Pharmacist Pager [phone] 08/15/2015 11:04 AM

## 2015-08-16 ENCOUNTER — Inpatient Hospital Stay (HOSPITAL_COMMUNITY): Payer: Medicare Other

## 2015-08-16 DIAGNOSIS — I639 Cerebral infarction, unspecified: Secondary | ICD-10-CM

## 2015-08-16 DIAGNOSIS — I6310 Cerebral infarction due to embolism of unspecified precerebral artery: Secondary | ICD-10-CM

## 2015-08-16 DIAGNOSIS — N19 Unspecified kidney failure: Secondary | ICD-10-CM

## 2015-08-16 DIAGNOSIS — G9341 Metabolic encephalopathy: Secondary | ICD-10-CM

## 2015-08-16 LAB — CULTURE, BLOOD (ROUTINE X 2)
Culture: NO GROWTH
Culture: NO GROWTH

## 2015-08-16 LAB — CBC WITH DIFFERENTIAL/PLATELET
BASOS ABS: 0 10*3/uL (ref 0.0–0.1)
BASOS PCT: 0 %
Eosinophils Absolute: 0 10*3/uL (ref 0.0–0.7)
Eosinophils Relative: 0 %
HEMATOCRIT: 32.4 % — AB (ref 36.0–46.0)
HEMOGLOBIN: 9.8 g/dL — AB (ref 12.0–15.0)
LYMPHS PCT: 5 %
Lymphs Abs: 0.6 10*3/uL — ABNORMAL LOW (ref 0.7–4.0)
MCH: 26.6 pg (ref 26.0–34.0)
MCHC: 30.2 g/dL (ref 30.0–36.0)
MCV: 88 fL (ref 78.0–100.0)
MONO ABS: 0.9 10*3/uL (ref 0.1–1.0)
Monocytes Relative: 9 %
NEUTROS ABS: 8.9 10*3/uL — AB (ref 1.7–7.7)
NEUTROS PCT: 86 %
Platelets: 224 10*3/uL (ref 150–400)
RBC: 3.68 MIL/uL — AB (ref 3.87–5.11)
RDW: 15.4 % (ref 11.5–15.5)
WBC: 10.4 10*3/uL (ref 4.0–10.5)

## 2015-08-16 LAB — CSF CULTURE: CULTURE: NO GROWTH

## 2015-08-16 LAB — BASIC METABOLIC PANEL
ANION GAP: 13 (ref 5–15)
BUN: 120 mg/dL — ABNORMAL HIGH (ref 6–20)
CALCIUM: 8.3 mg/dL — AB (ref 8.9–10.3)
CO2: 26 mmol/L (ref 22–32)
Chloride: 103 mmol/L (ref 101–111)
Creatinine, Ser: 7.21 mg/dL — ABNORMAL HIGH (ref 0.44–1.00)
GFR, EST AFRICAN AMERICAN: 6 mL/min — AB (ref >60)
GFR, EST NON AFRICAN AMERICAN: 5 mL/min — AB (ref >60)
Glucose, Bld: 314 mg/dL — ABNORMAL HIGH (ref 65–99)
POTASSIUM: 3.7 mmol/L (ref 3.5–5.1)
SODIUM: 142 mmol/L (ref 135–145)

## 2015-08-16 LAB — GLUCOSE, CAPILLARY
GLUCOSE-CAPILLARY: 251 mg/dL — AB (ref 65–99)
GLUCOSE-CAPILLARY: 323 mg/dL — AB (ref 65–99)
GLUCOSE-CAPILLARY: 207 mg/dL — AB (ref 65–99)
GLUCOSE-CAPILLARY: 235 mg/dL — AB (ref 65–99)
Glucose-Capillary: 284 mg/dL — ABNORMAL HIGH (ref 65–99)
Glucose-Capillary: 297 mg/dL — ABNORMAL HIGH (ref 65–99)

## 2015-08-16 LAB — HERPES SIMPLEX VIRUS(HSV) DNA BY PCR
HSV 1 DNA: NEGATIVE
HSV 2 DNA: NEGATIVE

## 2015-08-16 LAB — PHENYTOIN LEVEL, TOTAL: PHENYTOIN LVL: 5.2 ug/mL — AB (ref 10.0–20.0)

## 2015-08-16 MED ORDER — AMOXICILLIN-POT CLAVULANATE 500-125 MG PO TABS
1.0000 | ORAL_TABLET | Freq: Every day | ORAL | Status: DC
  Administered 2015-08-16 – 2015-08-17 (×2): 500 mg
  Filled 2015-08-16 (×2): qty 1

## 2015-08-16 MED ORDER — INSULIN GLARGINE 100 UNIT/ML ~~LOC~~ SOLN
20.0000 [IU] | Freq: Every day | SUBCUTANEOUS | Status: DC
  Administered 2015-08-16: 20 [IU] via SUBCUTANEOUS
  Filled 2015-08-16 (×4): qty 0.2

## 2015-08-16 MED ORDER — HYDRALAZINE HCL 25 MG PO TABS
25.0000 mg | ORAL_TABLET | Freq: Three times a day (TID) | ORAL | Status: DC
  Administered 2015-08-16 – 2015-08-19 (×8): 25 mg via ORAL
  Filled 2015-08-16 (×12): qty 1

## 2015-08-16 MED ORDER — VITAL HIGH PROTEIN PO LIQD
1000.0000 mL | ORAL | Status: DC
  Administered 2015-08-16: 1000 mL
  Administered 2015-08-16 – 2015-08-17 (×3)
  Administered 2015-08-17 – 2015-08-20 (×4): 1000 mL
  Filled 2015-08-16 (×8): qty 1000

## 2015-08-16 MED ORDER — HEPARIN SODIUM (PORCINE) 1000 UNIT/ML DIALYSIS
1000.0000 [IU] | INTRAMUSCULAR | Status: DC | PRN
  Administered 2015-08-16: 3000 [IU] via INTRAVENOUS_CENTRAL
  Filled 2015-08-16: qty 3
  Filled 2015-08-16 (×2): qty 6

## 2015-08-16 NOTE — Progress Notes (Addendum)
PULMONARY / CRITICAL CARE MEDICINE   Name: Melissa Becker MRN: [id number] DOB: [birth year]    ADMISSION DATE:  08/21/2015  REFERRING MD:  Anselmo Pickler, D.O. [facility])  Brief:  Admitted on 12/4 after a cardiac arrest and seizure, found to have an ischemic stroke.  Remains intubated.  SUBJECTIVE:  Still not waking up BUN/Cr both up BP up LP OK MRI OK   VITAL SIGNS: BP 188/53 mmHg  Pulse 67  Temp(Src) 99.6 F (37.6 C) (Oral)  Resp 21  Ht 5\' 6"  (1.676 m)  Wt 109.8 kg (242 lb 1 oz)  BMI 39.09 kg/m2  SpO2 100%  HEMODYNAMICS:    VENTILATOR SETTINGS: Vent Mode:  [-] PSV;CPAP FiO2 (%):  [30 %-40 %] 40 % Set Rate:  [16 bmp] 16 bmp Vt Set:  [550 mL] 550 mL PEEP:  [5 cmH20] 5 cmH20 Pressure Support:  [10 cmH20] 10 cmH20 Plateau Pressure:  [15 cmH20-20 cmH20] 20 cmH20  INTAKE / OUTPUT: I/O last 3 completed shifts: In: 2494 [I.V.:360; NG/GT:1620; IV [id number] Out: [id number]; Stool:275]  PHYSICAL EXAMINATION:  Gen: chronically ill appearing on vent HENT: OP clear, ETT in place PULM: CTA B, vent supported breaths CV: few PAC's, but otherwise regular, systolic murmur noted, trace edema GI: BS+, soft, nontender Derm: no cyanosis or rash Neuro: opens eyes to voice but minimal response to pain, does not follow commands    LABS; CBC Recent Labs     08/14/15  0335  08/15/15  0303  08/16/15  0250  WBC  8.9  8.9  10.4  HGB  9.0*  9.9*  9.8*  HCT  30.1*  32.6*  32.4*  PLT  177  184  224    Coag's No results for input(s): APTT, INR in the last 72 hours.  BMET Recent Labs     08/14/15  0335  08/15/15  0303  08/16/15  0250  NA  142  143  142  K  3.6  3.6  3.7  CL  105  101  103  CO2  22  24  26   BUN  76*  96*  120*  CREATININE  6.06*  6.71*  7.21*  GLUCOSE  194*  282*  314*    Electrolytes Recent Labs     08/14/15  0335  08/15/15  0303  08/16/15  0250  CALCIUM  7.1*  7.9*  8.3*  MG  2.0   --    --   PHOS  7.9*  9.2*   --      Sepsis Markers No results for input(s): PROCALCITON, O2SATVEN in the last 72 hours.  Invalid input(s): LACTICACIDVEN  ABG No results for input(s): PHART, PCO2ART, PO2ART in the last 72 hours.  Liver Enzymes Recent Labs     08/14/15  0335  08/15/15  0303  ALBUMIN  2.1*  2.1*    Cardiac Enzymes No results for input(s): TROPONINI, PROBNP in the last 72 hours.  Glucose Recent Labs     08/15/15  1113  08/15/15  1536  08/15/15  1946  08/15/15  2336  08/16/15  0326  08/16/15  0809  GLUCAP  264*  263*  276*  297*  251*  323*    Imaging Mr Brain Wo Contrast  08/15/2015  CLINICAL DATA:  Stroke. Diabetes and hypertension and atrial fibrillation EXAM: MRI HEAD WITHOUT CONTRAST TECHNIQUE: Multiplanar, multiecho pulse sequences of the brain and surrounding structures were obtained without intravenous contrast. COMPARISON:  MRI head 08/12/2015 FINDINGS:  Multiple areas of restricted diffusion compatible with acute infarct. The largest area is in the right occipital parietal lobe and is unchanged. Smaller areas of acute infarct in the posterior right frontal lobe unchanged. Small areas of acute infarct in the left frontal cortex, left occipital lobe, and cerebellum bilaterally unchanged from the prior study. Small areas of acute infarct in the right temporal lobe unchanged. Negative for intracranial hemorrhage.  Negative for mass or edema Mild atrophy.  Negative for hydrocephalus. Mild mastoid sinus effusion on the right. IMPRESSION: Multiple areas of acute infarction are unchanged from 08/12/2015. Negative for hemorrhage. Findings suggest cerebral emboli. Electronically Signed   By: Franchot Gallo M.D.   On: 08/15/2015 14:08   Dg Chest Port 1 View  08/16/2015  CLINICAL DATA:  Acute respiratory failure with hypoxia EXAM: PORTABLE CHEST 1 VIEW COMPARISON:  Portable chest x-ray of August 15, 2015 FINDINGS: The patient is rotated toward the right on today's study. The lungs remain mildly  hypoinflated. There are coarse interstitial markings at the left lung base with persistent obscuration of the hemidiaphragm. The cardiac silhouette is mildly enlarged. The pulmonary vascularity is indistinct. The endotracheal tube tip lies 4.8 cm above the carina. The esophagogastric tube tip projects below the inferior margin of the image. The observed portions of the bony thorax are unremarkable. IMPRESSION: Allowing for differences in patient positioning there has been overall improvement in the appearance of the pulmonary interstitium especially on the right. There is persistent left lower lobe atelectasis or pneumonia with small left pleural effusion. Stable cardiomegaly. The support tubes are in reasonable position. Electronically Signed   By: David  Martinique M.D.   On: 08/16/2015 07:16   Dg Chest Port 1 View  08/15/2015  CLINICAL DATA:  69 year old female status post cardiac arrest, seizures, found have bilateral cerebral infarcts, sepsis. Initial encounter. EXAM: PORTABLE CHEST 1 VIEW COMPARISON:  08/14/2015 and earlier. FINDINGS: The patient remains significantly rotated to the left. Two Portable AP semi upright views at 0453 hours. Endotracheal tube tip at the level the clavicles. Enteric tube courses to the abdomen. Side hole at the level of the gastric cardia. Increased since 08/31/2015 and moderate size left pleural effusion. Dense left lung base opacification. No pneumothorax. Patchy opacity at the right lung base has mildly increased over the same time. Grossly stable mediastinal contours. IMPRESSION: 1.  Stable lines and tubes. 2. Moderate volume left pleural effusion has progressed since 08/20/2015. 3. Mild patchy opacity at the right lung base is new since that time. Electronically Signed   By: Genevie Ann M.D.   On: 08/15/2015 07:14   Dg Fluoro Guide Lumbar Puncture  08/15/2015  CLINICAL DATA:  Encephalitis EXAM: DIAGNOSTIC LUMBAR PUNCTURE UNDER FLUOROSCOPIC GUIDANCE FLUOROSCOPY TIME:  Radiation  Exposure Index (as provided by the fluoroscopic device): 228 microGy*m^2 PROCEDURE: PROCEDURE I discussed the risks (including hemorrhage, infection, headache, and nerve damage, among others), benefits, and alternatives to fluoroscopically guided lumbar puncture with the patient's husband by telephone. We specifically discussed the high technical likelihood of success of the procedure. The patient's husband understood and elected for the patient to undergo the procedure. I reviewed the MRI lumbar spine from 08/12/2015 in determining a suitable level for puncture. Standard time-out was employed. Following sterile skin prep and local anesthetic administration consisting of 1 percent lidocaine, a 20 gauge 5 inch spinal needle was advanced without difficulty into the thecal sac at the at the L5-S1 level from a slightly were left paramedian approach. Clear CSF was returned. Opening  pressure was not obtained -the patient is on a ventilator and turning the patient with the needle in position would be problematic. 12 cc of clear CSF was collected. The needle was subsequently removed and the skin cleansed and bandaged. No immediate complications were observed. IMPRESSION: 1. Successful lumbar puncture at the L5-S1 level yielding 12 cc of clear CSF. Electronically Signed   By: Van Clines M.D.   On: 08/15/2015 15:04       STUDIES:  12/03 CT head [facility]) >> atrophy 12/04 CT head >> acute Rt parietal occipital infarct, Rt cerebellar infarct 12/04 U/S Abd >> unremarkable appearance of kidneys 12/05 MRI/MRA brain >> numerous b/l infarcts concerning for embolic infarcts [id number] Echo >> EF 50%, diffuse hypokinesis, grade 1 diastolic dysfx, mod AS, mod LA dilation 12/05 EEG >> generalized slowing 12/05 MRI lumbar spine >> L2-3, L3-4 spinal stenosis, degenerative arthropathy L3-4, L4-5 12/8 MRI brain >> no change in acute infarcts  12/8 LP > only 4 WBC, 30 RBC, glucose 162, Protein normal  CULTURES: 12/03  Blood ([place]) >> 12/03 Urine ([place]) >> 12/04 Blood >> 12/04 Respiratory viral panel >> negative 12/04 Pneumococcal Ag >> negative 12/05 CSF >> Negative 12/06 Legionella Ag >> negative 12/8 CSF >> NGTD 12/8 CSF HSV PCR >   ANTIBIOTICS: 12/04 Tressie Ellis >> 12/8 12/04 Ampicillin >> 12/07 12/04 Vancomycin >>  12/8 12/06 Diflucan >>12/8  SIGNIFICANT EVENTS: 12/03 Admit to OSH w/ Seizure & Fall 12/04 Transfer to [facility]; weaned off pressors; neurology consulted 12/05 Difficulty with LP due to spinal stenosis >> only cultures sent; add HCO3 to IV fluid 12/06 Nephrology consulted; d/c HCO3 from IV fluid 12/9 Start HD  LINES/TUBES: 12/03 ETT ([place]) >> 12/9 R IJ HD cath >   DISCUSSION: 69 yo female presented to [facility] with 2 weeks of HA, and had developed fever up to 102F.  She had fall at home with Rtward gaze and Lt sided weakness.  She is reported to have seizure at [facility].  There is also report of ventricular fibrillation at [facility] >> ?if this was ECG artifact in setting of seizure.  She has hx of DM, HTN, TIA, Hypothyroidism, Neuropathy, Aortic stenosis, CAD, GERD, A fib, Rt foot osteomyelitis.  12/9 > still no improvement in exam, could this be uremia?  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 Unclear degree of injury from strokes Hx of DM neuropathy  P:   RASS goal 0 Hemodialysis today AED's per neurology  F/u LP 12/8, HSV PCR Hold outpt cymbalta  PULMONARY A: Acute hypoxic respiratory failure and concern for pneumonia >> might have had viral prodrome Interstitial edema with pleural effusions P:   Full vent support F/u CXR Negative fluid balance as tolerated  CARDIOVASCULAR A:  Hx of A fib >> not on anticoagulation as outpt Hx of HTN, CAD, HLD, mod aortic stenosis VF arrest at outside hospital >> happened with seizure, might have been ECG artifact in setting of  seizure Elevated troponin >> demand ischemia Intermittent bradycardia Hypertension P:  Tele Continue metoprolol Add scheduled hydralazine Defer cardiology assessment for now until mental status improves Continue norvasc, lipitor, ASA Hold outpt cozaar Plan to start anticoagulation around day 7 after stroke  RENAL A:   AKI with oliguria >> baseline creatinine 0.8 from August 123456, cause uncertain? ?Uremia P:   Replace electrolytes as needed Start HD today Appreciate renal input  GASTROINTESTINAL A:   Nutrition P:  Tube feeds Protonix for SUP  HEMATOLOGIC A:   Anemia of critical illness and chronic disease P:  F/u CBC SCD's, SQ heparin for DVT prevention Monitor for bleeding  INFECTIOUS A:   Septic shock >> likely from pneumonia with possible viral prodrome> resolved Doubt meningitis/encephalitis Chronic Rt foot osteomyelitis Yeast UTI P:   Day 5 fortaz, vancomycin for pneumonia > stop both today Day 3 fluconazole for yeast UTI > stop D/c ampicillin 12/07  Repeat LP on 12/8 > could this be HSV?   Home doxycycline> pharmacy to research if this was chronic or not, if so then will restart  ENDOCRINE A:   Hx of DM, hypothyroidism Worsening hyperglycemia P:   SSI Hold outpt glimepiride, metformin Continue synthroid Start glargine 12/9  Husband updated bedside by me 12/9  CC time 36 minutes  Roselie Awkward, MD [place] [facility] Pager: [phone] Cell: [phone] After 3pm or if no response, call [phone]

## 2015-08-16 NOTE — Progress Notes (Signed)
EEG Completed; Results Pending  

## 2015-08-16 NOTE — Progress Notes (Signed)
Nutrition Follow-up  DOCUMENTATION CODES:   Obesity unspecified  INTERVENTION:    Continue Vital High Protein via OGT at 45 ml/h (1080 ml/day) with Prostat 30 ml BID to meet nutrition goals.  NUTRITION DIAGNOSIS:   Inadequate oral intake related to inability to eat as evidenced by NPO status.  Ongoing  GOAL:   Provide needs based on ASPEN/SCCM guidelines  Met  MONITOR:   Vent status, Labs, Weight trends, TF tolerance  REASON FOR ASSESSMENT:   Consult Enteral/tube feeding initiation and management  ASSESSMENT:   Pt with hx of right foot osteomyelitis with toe amputation. Admitted after 2 weeks of HA with fever. Pt with acute encephalopathy with status epilepticus likely from acute embolic infarcts.   Patient transferred to MICU on 12/7 and remains intubated on ventilator support. No longer receiving Propofol. Remains azotemic, ? uremia. Nephrology following. She is receiving HD, no volume removal today.  Patient is currently receiving Vital High Protein via OGT at 45 ml/h (1080 ml/day) with Prostat 30 ml BID to provide 1280 kcals, 125 gm protein, 903 ml free water daily.   Diet Order:  Diet NPO time specified  Skin:  Reviewed, no issues  Last BM:  12/9 (rectal tube)  Height:   Ht Readings from Last 1 Encounters:  09/07/2015 5' 6"  (1.676 m)    Weight:   Wt Readings from Last 1 Encounters:  08/16/15 242 lb 1 oz (109.8 kg)   08/12/15 252 lb 10.4 oz (114.6 kg)       Ideal Body Weight:  59 kg  BMI:  Body mass index is 39.09 kg/(m^2).  Estimated Nutritional Needs:   Kcal:  6728-9791  Protein:  >/= 118 gm  Fluid:  >1.5 L  EDUCATION NEEDS:   No education needs identified at this time  Molli Barrows, [place], [place], [place] Pager [phone] After Hours Pager [phone]

## 2015-08-16 NOTE — Progress Notes (Signed)
STROKE TEAM PROGRESS NOTE   SUBJECTIVE (INTERVAL HISTORY) Her RN is at the bedside. She has no significant change since yesterday. Still open eyes but not following commands. Had LP and CSF not supportive for CNS infection. MRI brain no change comparing with before. However, Cre up to 7.21 and BUN 120. HD planned for today. Will do EEG to rule out seizure due to uremia.   OBJECTIVE Temp:  [98 F (36.7 C)-100.4 F (38 C)] 100.4 F (38 C) (12/09 1155) Pulse Rate:  [31-150] 61 (12/09 1200) Cardiac Rhythm:  [-] Atrial fibrillation (12/09 1200) Resp:  [17-24] 24 (12/09 1200) BP: (113-193)/(49-100) 113/100 mmHg (12/09 1200) SpO2:  [98 %-100 %] 99 % (12/09 1200) FiO2 (%):  [30 %-40 %] 40 % (12/09 1135) Weight:  [242 lb 1 oz (109.8 kg)] 242 lb 1 oz (109.8 kg) (12/09 0357)  CBC:   Recent Labs Lab 08/15/15 0303 08/16/15 0250  WBC 8.9 10.4  NEUTROABS  --  8.9*  HGB 9.9* 9.8*  HCT 32.6* 32.4*  MCV 87.6 88.0  PLT 184 [id number]    Basic Metabolic Panel:   Recent Labs Lab 08/13/15 0238 08/14/15 0335 08/15/15 0303 08/16/15 0250  NA 143 142 143 142  K 4.0 3.6 3.6 3.7  CL 108 105 101 103  CO2 22 22 24 26   GLUCOSE 185* 194* 282* 314*  BUN 60* 76* 96* 120*  CREATININE 5.44* 6.06* 6.71* 7.21*  CALCIUM 7.1* 7.1* 7.9* 8.3*  MG 1.5* 2.0  --   --   PHOS 7.6* 7.9* 9.2*  --     Lipid Panel:     Component Value Date/Time   CHOL 127 08/13/2015 0238   TRIG 191* 08/13/2015 0238   TRIG 195* 08/13/2015 0238   HDL 28* 08/13/2015 0238   CHOLHDL 4.5 08/13/2015 0238   VLDL 39 08/13/2015 0238   LDLCALC 60 08/13/2015 0238   HgbA1c:  Lab Results  Component Value Date   HGBA1C 7.4* 08/12/2015   Urine Drug Screen:     Component Value Date/Time   LABOPIA POSITIVE* 08/12/2015 1430   COCAINSCRNUR NONE DETECTED 08/12/2015 1430   LABBENZ POSITIVE* 08/12/2015 1430   AMPHETMU NONE DETECTED 08/12/2015 1430   THCU NONE DETECTED 08/12/2015 1430   LABBARB NONE DETECTED 08/12/2015 1430       IMAGING I have personally reviewed the radiological images below and agree with the radiology interpretations.  CT HEAD 09/03/2015   Acute moderate RIGHT parietal occipital infarct (watershed versus angular branch of the MCA). Small acute RIGHT cerebellar infarct. Small area RIGHT frontal encephalomalacia may be posttraumatic or ischemic.   US Abdomen Complete 08/10/2015  1. Status post cholecystectomy. No acute abnormality seen within the abdomen. 2. Kidneys unremarkable in appearance. 3. Scattered calcific atherosclerotic disease along the abdominal aorta.     MRI and MRA HEAD 08/12/2015  Exam is motion degraded. Numerous bilateral acute/subacute nonhemorrhagic infarcts. Most confluent infarcts have an appearance of acute/subacute infarct involves portions of the right frontal -parietal - occipital and posterior right temporal lobe. Swelling of gyri without significant local mass effect. Smaller acute nonhemorrhagic infarcts scattered throughout the hemispheres bilaterally (frontal lobes, parietal lobes, occipital lobes), right temporal lobe/subinsular region and cerebellum bilaterally. The involvement of multiple vascular distributions raises possibility of embolic disease. Remote small right cerebellar infarct. Exam is slightly motion degraded. Mild irregularity and slight narrowing cavernous segment right internal carotid artery. Small bulge may be related to atherosclerotic type changes rather than aneurysm. No significant stenosis of either carotid terminus  or M1 segment of either middle cerebral artery. Middle cerebral artery branch vessel irregularity and narrowing bilaterally. Mild narrowing and irregularity A1 segment right anterior cerebral artery. Bulge of the distal A1 segment of the right anterior cerebral artery appears to be origin of a vessel rather than saccular aneurysm. Mild to moderate narrowing A2 segment right anterior cerebral artery. Left vertebral artery is dominant. Mild to  moderate narrowing distal right vertebral artery. Mild to moderate narrowing portions of the posterior inferior cerebellar artery bilaterally. Mild to moderate narrowing portions of the anterior inferior cerebellar artery bilaterally. Narrowing of the superior cerebellar artery more notable on the left. Mild to moderate narrowing portions of the proximal, mid and distal aspect of the posterior cerebral artery bilaterally. Tiny bulge superior margin P1 segments left posterior cerebral artery. Question tiny aneurysm (less than 2 mm). A vessel may rise from this region.   Mr Brain Wo Contrast  08/15/2015  IMPRESSION: Multiple areas of acute infarction are unchanged from 08/12/2015. Negative for hemorrhage. Findings suggest cerebral emboli.  14:08   CUS - Bilateral: 1-39% ICA stenosis. Vertebral artery flow is antegrade.  2D echo - Normal LV size with EF 50%, diffuse hypokinesis. Mildly dilated RV with normal systolic function. Biatrial enlargement. Moderate aortic stenosis. Dilated IVC suggests elevated RV filling pressure. Mild pulmonary hypertension.  Mr Lumbar Spine Wo Contrast  08/12/2015  IMPRESSION: No complications seen following unsuccessful lumbar puncture. Mild multifactorial spinal stenosis at L2-3 and L3-4 could have contributed to the difficulty at at lumbar puncture. Degenerative facet arthropathy at L3-4 and L4-5. Should a lumbar puncture be repeated, greater likelihood of success might be obtained at the L4-5 or L5-S1 level.  Dg Chest Port 1 View 08/16/2015  IMPRESSION: Allowing for differences in patient positioning there has been overall improvement in the appearance of the pulmonary interstitium especially on the right. There is persistent left lower lobe atelectasis or pneumonia with small left pleural effusion. Stable cardiomegaly. The support tubes are in reasonable position.   08/13/2015  IMPRESSION: 1. Persistent left lower lobe atelectasis and small left pleural effusion.  Stable mild low-grade CHF. The endotracheal tube is in reasonable position. 2. The nasogastric tube tip and proximal port lie in the region of the gastric body below the expected location of the GE junction.   EEG - This EEG is abnormal with severe generalized continuous nonspecific slowing cerebral activity. This pattern of slowing can be seen with metabolic and toxic encephalopathies, as well as with severe degenerative central nervous system disorders. No evidence of epileptiform activity was recorded.  Repeat EEG - pending  Physical exam  Temp:  [98 F (36.7 C)-100.4 F (38 C)] 100.4 F (38 C) (12/09 1155) Pulse Rate:  [31-150] 61 (12/09 1200) Resp:  [17-24] 24 (12/09 1200) BP: (113-193)/(49-100) 113/100 mmHg (12/09 1200) SpO2:  [98 %-100 %] 99 % (12/09 1200) FiO2 (%):  [30 %-40 %] 40 % (12/09 1135) Weight:  [242 lb 1 oz (109.8 kg)] 242 lb 1 oz (109.8 kg) (12/09 0357)  General - Well nourished, well developed, intubated and sedated.  Ophthalmologic - Fundi not visualized due to agitation.  Cardiovascular - Regular rhythm and rate.  Neuro - intubated but eyes open on voice and able to track objects intermittently, not following commands. PERRL, doll's eyes present, b/l corneal weak reflex but R stronger blinking than left, gag present, breathing over the vent, BLEs no movement on pain stimulation which is change from yesterday, BLE 0/5 on pain stimulation, DTR 1+ throughout, no  babinski bilaterally.   ASSESSMENT/PLAN Ms. Melissa Becker is a 69 y.o. female with history of DM, HTN, CAD, atrial fibrillation, TIA, GI bleed presenting with complaints of a HA who fell at home due to weakness who developed seizures followed by v fib/cardiac arrest at an outlying hospital. She was intubated and transferred to done where she was found to have a R parietal occipital infarct and small R cerebellar infarct. She did not receive IV t-PA due to unknown time last known well.   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 Tiny L PCA aneurysm  Repeat MRI brain 08/15/15 no change  Carotid Doppler  Unremarkable   2D Echo  EF 50%  LDL 60  HgbA1c 7.4  Heparin 5000 units sq tid for VTE prophylaxis Diet NPO time specified  No antithrombotic prior to admission, now on ASA 325mg  for stroke prevention. No AC at this time due to risk of hemorrhagic transformation in the setting of large infarcts at right temporal lobe. Will consider AC in 5-7 days post stroke and if no planned procedures.   Ongoing aggressive stroke risk factor management  Therapy recommendations:  pending  Disposition:  pending  Seizure  Likely secondary to cortical infarcts  Dilantin level low at 6.4 -> 5.6->5.2, but corrected level around 20  On dilantin 100mg  tid  EEG no seizure  Will repeat EEG today to rule out seizure due to uremia   Dilantin level daily  Uremic encephalopathy  Pt still open eyes but not tracking and not moving LEs as before  MRI no acute changes comparing with initial MRI  LP not supportive for CNS infection   Neuro change likely due to uremic encephalopathy  Will repeat EEG to rule out NCSE  AKI and uremia  CK 134  Cre 3.91 -> 4.26 -> 5.44-> 6.06-> 6.71->7.21  BUN 120 today  On lasix high dose  Nephrology on board  Will do HD today  ? Sepsis  procalcitonin 19.06 -> 16.71->12.60  afebrile  WBC 13.5 -> 9.8->8.9->8.9->10.4  Off antibiotics  CSF not supportive for CNS infection  Pt afebrile, WBC normolized  Vent dependent respiratory failure  Intubated  CCM managing  Likely need trach  soon  Atrial Fibrillation  Home anticoagulation:  No antithrombotics at home  CHA2DS2-VASc Score = 6, ?2 oral anticoagulation recommended  Age in Years:  4-74   +1   Sex:  Female   +1    Hypertension History: yes   +1     Diabetes Mellitus:  yes   +1   Congestive Heart Failure History:  0  Vascular Disease History:  0     Stroke/TIA/Thromboembolism History: yes   +2  On ASA for now. Hold off AC for 5-7 days to avoid hemorrhagic transformation and if no planned procedures.    Hypertension  Still elevated likely due to AKI BP goal normotensive.  Hyperlipidemia  Home meds:  lipitor 80 resumed in hospital  LDL 60, goal < 70  Continue statin at discharge  Diabetes type II  HgbA1c 7.4, goal < 7.0  Not controlled  SSI  Other Stroke Risk Factors  Advanced age  Former Cigarette smoker, quit smoking 22 years  ago   Morbid Obesity, Body mass index is 39.09 kg/(m^2).   Hx stroke/TIA  Coronary artery disease  Aortic stenosis  Other Active Problems  Hypothyroid  Osteomyelitis R foot  Hospital day # 5  This patient is critically ill due to multifocal emboli stroke, seizure, respiratory failure, sepsis and at significant risk of neurological worsening, death form recurrent stroke, cerebral edema, brain herniation, heart failure, septic shock. This patient's care requires constant monitoring of vital signs, hemodynamics, respiratory and cardiac monitoring, review of multiple databases, neurological assessment, discussion with family, other specialists and medical decision making of high complexity. I spent 35 minutes of neurocritical care time in the care of this patient.  Rosalin Hawking, MD PhD Stroke Neurology 08/16/2015 12:53 PM    To contact Stroke Continuity provider, please refer to [email or web address] After hours, contact General Neurology

## 2015-08-16 NOTE — Procedures (Signed)
Hemodialysis Insertion Procedure Note TERRISA MIRENDA [id number] July 14, 1946  Procedure: Insertion of Hemodialysis Catheter Type: 3 port  Indications: Hemodialysis   Procedure Details Consent: Risks of procedure as well as the alternatives and risks of each were explained to the (patient/caregiver).  Consent for procedure obtained. Time Out: Verified patient identification, verified procedure, site/side was marked, verified correct patient position, special equipment/implants available, medications/allergies/relevent history reviewed, required imaging and test results available.  Performed  Maximum sterile technique was used including antiseptics, cap, gloves, gown, hand hygiene, mask and sheet. Skin prep: Chlorhexidine; local anesthetic administered A antimicrobial bonded/coated triple lumen catheter was placed in the right internal jugular vein using the Seldinger technique. Ultrasound guidance used.Yes.   Catheter placed to 16 cm. Blood aspirated via all 3 ports and then flushed x 3. Line sutured x 2 and dressing applied.  Evaluation Blood flow good Complications: No apparent complications Patient did tolerate procedure well. Chest X-ray ordered to verify placement.  CXR: pending.  Richardson Landry Odie Rauen ACNP [facility] PCCM Pager [phone] till 3 pm If no answer page [phone] 08/16/2015, 12:46 PM

## 2015-08-16 NOTE — Progress Notes (Signed)
[facility] Rounding Note Subjective:   Excellent UOP with lasix despite continued rise in creatinine BUN now over 100 There is some concern that this is symptomatic azotemia (ie uremia) Only way to tell would be to perform HD to correct and see if helps with her level of mentation Had LP - neg - not suggestive of encephalitis Getting EEG  Objective:    Vital signs in last 24 hours: Filed Vitals:   08/16/15 0700 08/16/15 0800 08/16/15 0815 08/16/15 0907  BP: 177/59 170/77    Pulse: 63 77  60  Temp:   99.6 F (37.6 C)   TempSrc:   Oral   Resp: 20 19  20   Height:      Weight:      SpO2: 99% 99%  99%   Weight change: -5.8 kg (-12 lb 12.6 oz)  Intake/Output Summary (Last 24 hours) at 08/16/15 1016 Last data filed at 08/16/15 0800  Gross per 24 hour  Intake   1553 ml  Output   3680 ml  Net  -2127 ml    Physical Exam:  Blood pressure 170/77, pulse 60, temperature 99.6 F (37.6 C), temperature source Oral, resp. rate 20, height 5\' 6"  (1.676 m), weight 109.8 kg (242 lb 1 oz), SpO2 99 %. Gen: Intubated, pale WF - currently undergoing preparation for EEG Skin: Bruising noted, ecchymoses arms.  Neck: Cannot see neck veins Chest: Crackles bases/ant fairly clear Heart: Distant heart sounds.  Irreg S1S2 No S3 1/7 murmur USB No diastolic murmur Abdomen: soft, obese, no masses felt + BS Ext: Pitting edema of arms much better, LE edema gone  Weight Trending 12/4 113.5 12/5 114.6 12/6 116    Diuretics started high dose 12/7 115.6 12/8 113.4 12/9 109.8  Labs:  Recent Labs Lab 08/31/2015 2010 08/12/15 0635 08/13/15 0238 08/14/15 0335 08/15/15 0303 08/16/15 0250  NA 142 142 143 142 143 142  K 4.3 4.3 4.0 3.6 3.6 3.7  CL 109 111 108 105 101 103  CO2 20* 16* 22 22 24 26   GLUCOSE 187* 171* 185* 194* 282* 314*  BUN 45* 49* 60* 76* 96* 120*  CREATININE 3.91* 4.26* 5.44* 6.06* 6.71* 7.21*  CALCIUM 7.3* 7.0* 7.1* 7.1* 7.9* 8.3*  PHOS 7.4* 7.1* 7.6* 7.9* 9.2*   --      Recent Labs Lab 08/28/2015 2010 08/13/15 0238 08/14/15 0335 08/15/15 0303  AST 51* 26  --   --   ALT 78* 52  --   --   ALKPHOS 60 54  --   --   BILITOT 0.5 0.5  --   --   PROT 6.2* 5.8*  --   --   ALBUMIN 2.7* 2.4* 2.1* 2.1*    Recent Labs Lab 08/15/2015 2010  LIPASE 36  AMYLASE 54     Recent Labs Lab 08/13/15 0238 08/14/15 0335 08/15/15 0303 08/16/15 0250  WBC 9.8 8.9 8.9 10.4  NEUTROABS  --   --   --  8.9*  HGB 9.1* 9.0* 9.9* 9.8*  HCT 30.4* 30.1* 32.6* 32.4*  MCV 89.9 88.5 87.6 88.0  PLT 163 177 184 224     Recent Labs Lab 08/12/2015 2010 08/12/15 0055 08/12/15 0635 08/13/15 0238  CKTOTAL  --   --   --  134  TROPONINI 4.62* 3.89* 2.48*  --      Recent Labs Lab 08/15/15 1536 08/15/15 1946 08/15/15 2336 08/16/15 0326 08/16/15 0809  GLUCAP 263* 276* 297* 251* 323*  Studies/Results: Mr Herby Abraham Contrast  08/15/2015  CLINICAL DATA:  Stroke. Diabetes and hypertension and atrial fibrillation EXAM: MRI HEAD WITHOUT CONTRAST TECHNIQUE: Multiplanar, multiecho pulse sequences of the brain and surrounding structures were obtained without intravenous contrast. COMPARISON:  MRI head 08/12/2015 FINDINGS: Multiple areas of restricted diffusion compatible with acute infarct. The largest area is in the right occipital parietal lobe and is unchanged. Smaller areas of acute infarct in the posterior right frontal lobe unchanged. Small areas of acute infarct in the left frontal cortex, left occipital lobe, and cerebellum bilaterally unchanged from the prior study. Small areas of acute infarct in the right temporal lobe unchanged. Negative for intracranial hemorrhage.  Negative for mass or edema Mild atrophy.  Negative for hydrocephalus. Mild mastoid sinus effusion on the right. IMPRESSION: Multiple areas of acute infarction are unchanged from 08/12/2015. Negative for hemorrhage. Findings suggest cerebral emboli. Electronically Signed   By: Franchot Gallo M.D.   On:  08/15/2015 14:08   Dg Chest Port 1 View  08/16/2015  CLINICAL DATA:  Acute respiratory failure with hypoxia EXAM: PORTABLE CHEST 1 VIEW COMPARISON:  Portable chest x-ray of August 15, 2015 FINDINGS: The patient is rotated toward the right on today's study. The lungs remain mildly hypoinflated. There are coarse interstitial markings at the left lung base with persistent obscuration of the hemidiaphragm. The cardiac silhouette is mildly enlarged. The pulmonary vascularity is indistinct. The endotracheal tube tip lies 4.8 cm above the carina. The esophagogastric tube tip projects below the inferior margin of the image. The observed portions of the bony thorax are unremarkable. IMPRESSION: Allowing for differences in patient positioning there has been overall improvement in the appearance of the pulmonary interstitium especially on the right. There is persistent left lower lobe atelectasis or pneumonia with small left pleural effusion. Stable cardiomegaly. The support tubes are in reasonable position. Electronically Signed   By: David  Martinique M.D.   On: 08/16/2015 07:16   Dg Chest Port 1 View  08/15/2015  CLINICAL DATA:  69 year old female status post cardiac arrest, seizures, found have bilateral cerebral infarcts, sepsis. Initial encounter. EXAM: PORTABLE CHEST 1 VIEW COMPARISON:  08/14/2015 and earlier. FINDINGS: The patient remains significantly rotated to the left. Two Portable AP semi upright views at 0453 hours. Endotracheal tube tip at the level the clavicles. Enteric tube courses to the abdomen. Side hole at the level of the gastric cardia. Increased since 08/26/2015 and moderate size left pleural effusion. Dense left lung base opacification. No pneumothorax. Patchy opacity at the right lung base has mildly increased over the same time. Grossly stable mediastinal contours. IMPRESSION: 1.  Stable lines and tubes. 2. Moderate volume left pleural effusion has progressed since 09/01/2015. 3. Mild patchy  opacity at the right lung base is new since that time. Electronically Signed   By: Genevie Ann M.D.   On: 08/15/2015 07:14   Dg Fluoro Guide Lumbar Puncture  08/15/2015  CLINICAL DATA:  Encephalitis EXAM: DIAGNOSTIC LUMBAR PUNCTURE UNDER FLUOROSCOPIC GUIDANCE FLUOROSCOPY TIME:  Radiation Exposure Index (as provided by the fluoroscopic device): 228 microGy*m^2 PROCEDURE: PROCEDURE I discussed the risks (including hemorrhage, infection, headache, and nerve damage, among others), benefits, and alternatives to fluoroscopically guided lumbar puncture with the patient's husband by telephone. We specifically discussed the high technical likelihood of success of the procedure. The patient's husband understood and elected for the patient to undergo the procedure. I reviewed the MRI lumbar spine from 08/12/2015 in determining a suitable level for puncture. Standard time-out was employed. Following  sterile skin prep and local anesthetic administration consisting of 1 percent lidocaine, a 20 gauge 5 inch spinal needle was advanced without difficulty into the thecal sac at the at the L5-S1 level from a slightly were left paramedian approach. Clear CSF was returned. Opening pressure was not obtained -the patient is on a ventilator and turning the patient with the needle in position would be problematic. 12 cc of clear CSF was collected. The needle was subsequently removed and the skin cleansed and bandaged. No immediate complications were observed. IMPRESSION: 1. Successful lumbar puncture at the L5-S1 level yielding 12 cc of clear CSF. Electronically Signed   By: Van Clines M.D.   On: 08/15/2015 15:04   Medications . sodium chloride 250 mL (08/14/15 0828)  . propofol (DIPRIVAN) infusion Stopped (08/14/15 0828)   . amLODipine  10 mg Per Tube q morning - 10a  . antiseptic oral rinse  7 mL Mouth Rinse 10 times per day  . aspirin  325 mg Per Tube Daily  . atorvastatin  80 mg Per Tube QPM  . chlorhexidine gluconate   15 mL Mouth Rinse BID  . feeding supplement (PRO-STAT SUGAR FREE 64)  30 mL Per Tube BID  . feeding supplement (VITAL HIGH PROTEIN)  1,000 mL Per Tube Q24H  . furosemide  160 mg Intravenous Q8H  . heparin subcutaneous  5,000 Units Subcutaneous 3 times per day  . insulin aspart  0-9 Units Subcutaneous 6 times per day  . levothyroxine  100 mcg Per Tube QAC breakfast  . metoprolol tartrate  25 mg Oral BID  . pantoprazole sodium  40 mg Per Tube Daily  . phenytoin (DILANTIN) IV  100 mg Intravenous 3 times per day   Background: 69 y.o. year-old 69 yo female with background hx of DM, HTN, TIA, hypothyroidism, neuropathy, aortic stenosis, CAD, GERD, A fib, Rt foot osteomyelitis. She was admitted to [facility] after presenting with HA, recent fevers to 102, fall at home, possible sz, acute encephalopathy with findings of acute bilateral embolic infarcts on imaging studies. Had VF arrest requiring defib X 3. Developed septic shock (felt 2/2 PNA vs viral syndrome) requiring pressors, VDRF. She was transferred here for further management. We are asked to see b/o AKI. Creatinine on admission to [facility] was 0.75 on 08/10/15, up to 2.7 on 12/4 day of transfer->3.9 on arrival here 12/4 and has continued to rise - up to 5.44 12/6  with minimal UOP. She was on benazepril prior to admission. Received CT scans at [facility] on 12/3 but I don't believe any contrast was administered. UA there showed >300 protein, large blood. US shows large kidneys (c/w DM). UPC 1.6 gm proteinuria (c/w DM) 6-30 RBC foley specimen.    Impression/Plan 1. AKI - normal renal function at baseline (0.75) , but UA at outside hospital with proteinuria so likely underlying diabetic nephropathy. ATN proximate cause for her AKI (shock/sepsis/arrest/ACE on board prior to presentation). CK normal so no rhabdo. No obstruction on [place], kidneys large (would be c/w DM; 1.6 gm proteinuria also fits).  Of course cannot r/o possibility  emboli to her kidneys. Creatinine is still rising as is BUN but her UOP response to lasix has excellent. Weight down 6 kg with diuresis. Quite azotemic and there is concern that this is uremia contributing to encephalopathy. No way to sort this out other than to correct the numbers with HD and see if improvement. As for her volume status - will hold diuretics today, reassess tomorrow -  she may be more azotemic because a little TOO dry...     2. Acute hypoxic resp failure/VDRF/PNA 3. Sepsis - fortaz/vanco/diflucan all stopped. Resolved septic shock 4. HTN - meds per CCM 5. Bilateral cerebral emboli with sz - dilantin 6. Encephalopathy - LP neg. Bilateral strokes. Is symptomatic azttemia playing a role? HD today no volume off - correct azotemia see if helps MS. 7. DM2 8. AF   Jamal Maes, MD [place] Pager 08/16/2015, 10:16 AM

## 2015-08-16 NOTE — Procedures (Signed)
ELECTROENCEPHALOGRAM REPORT  Date of Study: 08/16/2015  Patient's Name: Melissa Becker MRN: [id number] Date of Birth: 1945/11/08  Referring Provider: Dr. Mikey Bussing  Clinical History: This is a 69 year old woman with acute encephalopathy and acute bilateral embolic infarcts on imaging, s/p VF arrest and septic shock.   Medications: phenytoin (DILANTIN) injection 100 mg amLODipine (NORVASC) tablet 10 mg aspirin tablet 325 mg atorvastatin (LIPITOR) tablet 80 mg labetalol (NORMODYNE,TRANDATE) injection 20 mg levothyroxine (SYNTHROID, LEVOTHROID) tablet 100 mcg metoprolol tartrate (LOPRESSOR) tablet 25 mg ondansetron (ZOFRAN) injection 4 mg pantoprazole sodium (PROTONIX) 40 mg/20 mL oral suspension 40 mg sennosides (SENOKOT) 8.8 MG/5ML syrup 5 mL  Technical Summary: A multichannel digital EEG recording measured by the international 10-20 system with electrodes applied with paste and impedances below 5000 ohms performed as portable with EKG monitoring in an intubated and unresponsive patient, no sedating medications listed. Hyperventilation and photic stimulation were not performed.  The digital EEG was referentially recorded, reformatted, and digitally filtered in a variety of bipolar and referential montages for optimal display.   Description: The patient is intubated and unresponsive during the recording. No sedating medication listed. There is no clear posterior dominant rhythm. The background consists of a large amount of diffuse 4-5 Hz theta and 2-3 Hz delta slowing. There are occasional broad sharp waves seen over the right frontotemporal and right frontal regions, at times with spread to the left frontal region. Normal sleep architecture is not seen. With noxious stimulation, there is minimal reactivity noted. Hyperventilation and photic stimulation were not performed.  There were no electrographic seizures seen.    EKG lead was unremarkable.  Impression: This EEG is abnormal  due to the presence of: 1. Moderate diffuse slowing of the background 2. Occasional broad sharp waves over the right frontotemporal and right frontal regions  Clinical Correlation of the above findings indicates diffuse cerebral dysfunction that is non-specific in etiology and can be seen with hypoxic/ischemic injury, toxic/metabolic encephalopathies, or medication effect. Broad sharp waves over the right frontotemporal and right frontal regions indicate possible epileptogenic potential arising from these regions. There were no electrographic seizures seen in this study.   Ellouise Newer, M.D.

## 2015-08-17 ENCOUNTER — Inpatient Hospital Stay (HOSPITAL_COMMUNITY): Payer: Medicare Other

## 2015-08-17 DIAGNOSIS — G9340 Encephalopathy, unspecified: Secondary | ICD-10-CM

## 2015-08-17 DIAGNOSIS — J9601 Acute respiratory failure with hypoxia: Secondary | ICD-10-CM | POA: Insufficient documentation

## 2015-08-17 LAB — GLUCOSE, CAPILLARY
GLUCOSE-CAPILLARY: 162 mg/dL — AB (ref 65–99)
GLUCOSE-CAPILLARY: 242 mg/dL — AB (ref 65–99)
GLUCOSE-CAPILLARY: 275 mg/dL — AB (ref 65–99)
GLUCOSE-CAPILLARY: 282 mg/dL — AB (ref 65–99)
GLUCOSE-CAPILLARY: 290 mg/dL — AB (ref 65–99)
Glucose-Capillary: 282 mg/dL — ABNORMAL HIGH (ref 65–99)
Glucose-Capillary: 170 mg/dL — ABNORMAL HIGH (ref 65–99)

## 2015-08-17 LAB — RENAL FUNCTION PANEL
ALBUMIN: 2.3 g/dL — AB (ref 3.5–5.0)
Anion gap: 15 (ref 5–15)
BUN: 92 mg/dL — AB (ref 6–20)
CHLORIDE: 98 mmol/L — AB (ref 101–111)
CO2: 30 mmol/L (ref 22–32)
CREATININE: 5.21 mg/dL — AB (ref 0.44–1.00)
Calcium: 8.4 mg/dL — ABNORMAL LOW (ref 8.9–10.3)
GFR calc Af Amer: 9 mL/min — ABNORMAL LOW (ref >60)
GFR, EST NON AFRICAN AMERICAN: 8 mL/min — AB (ref >60)
Glucose, Bld: 306 mg/dL — ABNORMAL HIGH (ref 65–99)
PHOSPHORUS: 6.7 mg/dL — AB (ref 2.5–4.6)
Potassium: 4 mmol/L (ref 3.5–5.1)
SODIUM: 143 mmol/L (ref 135–145)

## 2015-08-17 LAB — ANAEROBIC CULTURE

## 2015-08-17 LAB — CBC WITH DIFFERENTIAL/PLATELET
BASOS ABS: 0 10*3/uL (ref 0.0–0.1)
Basophils Relative: 0 %
EOS ABS: 0.2 10*3/uL (ref 0.0–0.7)
EOS PCT: 2 %
HCT: 32.5 % — ABNORMAL LOW (ref 36.0–46.0)
Hemoglobin: 10 g/dL — ABNORMAL LOW (ref 12.0–15.0)
Lymphocytes Relative: 9 %
Lymphs Abs: 1 10*3/uL (ref 0.7–4.0)
MCH: 27.4 pg (ref 26.0–34.0)
MCHC: 30.8 g/dL (ref 30.0–36.0)
MCV: 89 fL (ref 78.0–100.0)
MONO ABS: 1.1 10*3/uL — AB (ref 0.1–1.0)
Monocytes Relative: 10 %
Neutro Abs: 9.4 10*3/uL — ABNORMAL HIGH (ref 1.7–7.7)
Neutrophils Relative %: 79 %
PLATELETS: 251 10*3/uL (ref 150–400)
RBC: 3.65 MIL/uL — AB (ref 3.87–5.11)
RDW: 15.4 % (ref 11.5–15.5)
WBC: 11.7 10*3/uL — AB (ref 4.0–10.5)

## 2015-08-17 LAB — PHENYTOIN LEVEL, TOTAL: PHENYTOIN LVL: 3.5 ug/mL — AB (ref 10.0–20.0)

## 2015-08-17 LAB — AMMONIA: Ammonia: 22 umol/L (ref 9–35)

## 2015-08-17 LAB — HEPATITIS B SURFACE ANTIBODY, QUANTITATIVE

## 2015-08-17 LAB — HEPATITIS B SURFACE ANTIGEN: HEP B S AG: NEGATIVE

## 2015-08-17 MED ORDER — INSULIN ASPART 100 UNIT/ML ~~LOC~~ SOLN
0.0000 [IU] | SUBCUTANEOUS | Status: DC
  Administered 2015-08-17: 11 [IU] via SUBCUTANEOUS
  Administered 2015-08-17 (×2): 4 [IU] via SUBCUTANEOUS
  Administered 2015-08-17: 11 [IU] via SUBCUTANEOUS
  Administered 2015-08-17 – 2015-08-18 (×5): 7 [IU] via SUBCUTANEOUS
  Administered 2015-08-18 – 2015-08-19 (×2): 4 [IU] via SUBCUTANEOUS
  Administered 2015-08-19 (×2): 7 [IU] via SUBCUTANEOUS
  Administered 2015-08-19: 4 [IU] via SUBCUTANEOUS
  Administered 2015-08-19 – 2015-08-20 (×3): 7 [IU] via SUBCUTANEOUS
  Administered 2015-08-20 (×4): 4 [IU] via SUBCUTANEOUS
  Administered 2015-08-20: 7 [IU] via SUBCUTANEOUS
  Administered 2015-08-21: 4 [IU] via SUBCUTANEOUS
  Administered 2015-08-21: 7 [IU] via SUBCUTANEOUS
  Administered 2015-08-21: 4 [IU] via SUBCUTANEOUS
  Administered 2015-08-21: 15 [IU] via SUBCUTANEOUS

## 2015-08-17 MED ORDER — DOXYCYCLINE HYCLATE 100 MG PO TABS
100.0000 mg | ORAL_TABLET | Freq: Two times a day (BID) | ORAL | Status: DC
  Administered 2015-08-17 – 2015-08-21 (×9): 100 mg via ORAL
  Filled 2015-08-17 (×10): qty 1

## 2015-08-17 MED ORDER — INSULIN GLARGINE 100 UNIT/ML ~~LOC~~ SOLN
30.0000 [IU] | Freq: Every day | SUBCUTANEOUS | Status: DC
  Administered 2015-08-17 – 2015-08-18 (×2): 30 [IU] via SUBCUTANEOUS
  Filled 2015-08-17 (×2): qty 0.3

## 2015-08-17 NOTE — Progress Notes (Signed)
[facility] Rounding Note  Subjective:  Events of last 24: Dialysis yesterday for azotemia (neuro feels that there is a big "uremic" component to her AMS)  BUN just under 100 now Pt has not waked up and shows no appreciable neurologic change Still making urine though less (diuretics held) Remains hemodynamically stable  Objective:    Vital signs in last 24 hours: Filed Vitals:   08/17/15 0510 08/17/15 0600 08/17/15 0700 08/17/15 0800  BP: 155/59 97/52 138/53 156/44  Pulse:  60 60 61  Temp:      TempSrc:      Resp:  17 21 23   Height:      Weight:      SpO2:  99% 99% 99%   Weight change: 1.7 kg (3 lb 12 oz)  Intake/Output Summary (Last 24 hours) at 08/17/15 0818 Last data filed at 08/17/15 0800  Gross per 24 hour  Intake   1370 ml  Output   1950 ml  Net   -580 ml    Physical Exam:  Blood pressure 156/44, pulse 61, temperature 98 F (36.7 C), temperature source Oral, resp. rate 23, height 5\' 6"  (1.676 m), weight 111.5 kg (245 lb 13 oz), SpO2 99 %. Gen: Intubated, pale WF - unresponsive Neck: Cannot see neck veins Chest: Lungs anteriorly fairly clear Heart: Distant heart sounds.  Irreg S1S2 No S3 1/7 murmur USB  No diastolic murmur Abdomen: soft, obese, no masses felt + BS Ext: Pitting edema of arms much better, LE edema gone. Missing toes.  Weight Trending 12/4 113.5 12/5 114.6 12/6 116    Diuretics started high dose 12/7 115.6 12/8 113.4 12/9 109.8 12/10 111.5  Labs:  Recent Labs Lab 08/21/2015 2010 08/12/15 0635 08/13/15 0238 08/14/15 0335 08/15/15 0303 08/16/15 0250 08/17/15 0415  NA 142 142 143 142 143 142 143  K 4.3 4.3 4.0 3.6 3.6 3.7 4.0  CL 109 111 108 105 101 103 98*  CO2 20* 16* 22 22 24 26 30   GLUCOSE 187* 171* 185* 194* 282* 314* 306*  BUN 45* 49* 60* 76* 96* 120* 92*  CREATININE 3.91* 4.26* 5.44* 6.06* 6.71* 7.21* 5.21*  CALCIUM 7.3* 7.0* 7.1* 7.1* 7.9* 8.3* 8.4*  PHOS 7.4* 7.1* 7.6* 7.9* 9.2*  --  6.7*     Recent  Labs Lab 09/07/2015 2010 08/13/15 0238 08/14/15 0335 08/15/15 0303 08/17/15 0415  AST 51* 26  --   --   --   ALT 78* 52  --   --   --   ALKPHOS 60 54  --   --   --   BILITOT 0.5 0.5  --   --   --   PROT 6.2* 5.8*  --   --   --   ALBUMIN 2.7* 2.4* 2.1* 2.1* 2.3*    Recent Labs Lab 09/03/2015 2010  LIPASE 36  AMYLASE 54     Recent Labs Lab 08/14/15 0335 08/15/15 0303 08/16/15 0250 08/17/15 0415  WBC 8.9 8.9 10.4 11.7*  NEUTROABS  --   --  8.9* 9.4*  HGB 9.0* 9.9* 9.8* 10.0*  HCT 30.1* 32.6* 32.4* 32.5*  MCV 88.5 87.6 88.0 89.0  PLT 177 184 224 251     Recent Labs Lab 08/21/2015 2010 08/12/15 0055 08/12/15 0635 08/13/15 0238  CKTOTAL  --   --   --  134  TROPONINI 4.62* 3.89* 2.48*  --      Recent Labs Lab 08/16/15 1154 08/16/15 1545 08/16/15 1931 08/16/15 2351 08/17/15  [place]     Studies/Results: Mr Brain Wo Contrast  08/15/2015  CLINICAL DATA:  Stroke. Diabetes and hypertension and atrial fibrillation EXAM: MRI HEAD WITHOUT CONTRAST TECHNIQUE: Multiplanar, multiecho pulse sequences of the brain and surrounding structures were obtained without intravenous contrast. COMPARISON:  MRI head 08/12/2015 FINDINGS: Multiple areas of restricted diffusion compatible with acute infarct. The largest area is in the right occipital parietal lobe and is unchanged. Smaller areas of acute infarct in the posterior right frontal lobe unchanged. Small areas of acute infarct in the left frontal cortex, left occipital lobe, and cerebellum bilaterally unchanged from the prior study. Small areas of acute infarct in the right temporal lobe unchanged. Negative for intracranial hemorrhage.  Negative for mass or edema Mild atrophy.  Negative for hydrocephalus. Mild mastoid sinus effusion on the right. IMPRESSION: Multiple areas of acute infarction are unchanged from 08/12/2015. Negative for hemorrhage. Findings suggest cerebral emboli. Electronically Signed    By: Franchot Gallo M.D.   On: 08/15/2015 14:08   Dg Chest Port 1 View  08/17/2015  CLINICAL DATA:  Hypoxia EXAM: PORTABLE CHEST 1 VIEW COMPARISON:  August 16, 2015 FINDINGS: Endotracheal tube tip is 4.7 cm above the carina. Central catheter tip is in the superior vena cava. Nasogastric tube tip and side port are below the diaphragm. No pneumothorax. There is atelectatic change in the left base with minimal left effusion. Lungs elsewhere clear. Heart is borderline prominent with pulmonary vascularity within normal limits. No adenopathy. No bone lesions. IMPRESSION: Tube and catheter positions as described without pneumothorax. Left base atelectasis with small left effusion. Stable slight cardiac prominence. Electronically Signed   By: Lowella Grip III M.D.   On: 08/17/2015 07:43   Dg Chest Port 1 View  08/16/2015  CLINICAL DATA:  Encounter for central line EXAM: PORTABLE CHEST 1 VIEW COMPARISON:  Earlier today FINDINGS: Endotracheal tube tip is just above the clavicular heads, positioning similar to prior. Nasogastric tube tip at the stomach. New right IJ dialysis catheter, tip at the upper SVC. No pneumothorax or new mediastinal widening. Haziness of the lower chest, greater on the left where there is probable layering pleural effusion. IMPRESSION: 1. New right IJ line without adverse finding. 2. Stable aeration compared to this morning. Electronically Signed   By: Monte Fantasia M.D.   On: 08/16/2015 13:10   Dg Chest Port 1 View  08/16/2015  CLINICAL DATA:  Acute respiratory failure with hypoxia EXAM: PORTABLE CHEST 1 VIEW COMPARISON:  Portable chest x-ray of August 15, 2015 FINDINGS: The patient is rotated toward the right on today's study. The lungs remain mildly hypoinflated. There are coarse interstitial markings at the left lung base with persistent obscuration of the hemidiaphragm. The cardiac silhouette is mildly enlarged. The pulmonary vascularity is indistinct. The endotracheal tube tip  lies 4.8 cm above the carina. The esophagogastric tube tip projects below the inferior margin of the image. The observed portions of the bony thorax are unremarkable. IMPRESSION: Allowing for differences in patient positioning there has been overall improvement in the appearance of the pulmonary interstitium especially on the right. There is persistent left lower lobe atelectasis or pneumonia with small left pleural effusion. Stable cardiomegaly. The support tubes are in reasonable position. Electronically Signed   By: David  Martinique M.D.   On: 08/16/2015 07:16   Dg Fluoro Guide Lumbar Puncture  08/15/2015  CLINICAL DATA:  Encephalitis EXAM: DIAGNOSTIC LUMBAR PUNCTURE UNDER FLUOROSCOPIC GUIDANCE FLUOROSCOPY TIME:  Radiation Exposure Index (  as provided by the fluoroscopic device): 228 microGy*m^2 PROCEDURE: PROCEDURE I discussed the risks (including hemorrhage, infection, headache, and nerve damage, among others), benefits, and alternatives to fluoroscopically guided lumbar puncture with the patient's husband by telephone. We specifically discussed the high technical likelihood of success of the procedure. The patient's husband understood and elected for the patient to undergo the procedure. I reviewed the MRI lumbar spine from 08/12/2015 in determining a suitable level for puncture. Standard time-out was employed. Following sterile skin prep and local anesthetic administration consisting of 1 percent lidocaine, a 20 gauge 5 inch spinal needle was advanced without difficulty into the thecal sac at the at the L5-S1 level from a slightly were left paramedian approach. Clear CSF was returned. Opening pressure was not obtained -the patient is on a ventilator and turning the patient with the needle in position would be problematic. 12 cc of clear CSF was collected. The needle was subsequently removed and the skin cleansed and bandaged. No immediate complications were observed. IMPRESSION: 1. Successful lumbar puncture  at the L5-S1 level yielding 12 cc of clear CSF. Electronically Signed   By: Van Clines M.D.   On: 08/15/2015 15:04   Medications . sodium chloride 250 mL (08/17/15 0000)  . propofol (DIPRIVAN) infusion Stopped (08/14/15 0828)   . amLODipine  10 mg Per Tube q morning - 10a  . amoxicillin-clavulanate  1 tablet Per Tube Daily  . antiseptic oral rinse  7 mL Mouth Rinse 10 times per day  . aspirin  325 mg Per Tube Daily  . atorvastatin  80 mg Per Tube QPM  . chlorhexidine gluconate  15 mL Mouth Rinse BID  . doxycycline  100 mg Oral Q12H  . feeding supplement (PRO-STAT SUGAR FREE 64)  30 mL Per Tube BID  . feeding supplement (VITAL HIGH PROTEIN)  1,000 mL Per Tube Q24H  . heparin subcutaneous  5,000 Units Subcutaneous 3 times per day  . hydrALAZINE  25 mg Oral 3 times per day  . insulin aspart  0-20 Units Subcutaneous 6 times per day  . insulin glargine  30 Units Subcutaneous Daily  . levothyroxine  100 mcg Per Tube QAC breakfast  . metoprolol tartrate  25 mg Oral BID  . pantoprazole sodium  40 mg Per Tube Daily  . phenytoin (DILANTIN) IV  100 mg Intravenous 3 times per day   Background: 69 y.o. year-old 69 yo female with background hx of DM, HTN, TIA, hypothyroidism, neuropathy, aortic stenosis, CAD, GERD, A fib, Rt foot osteomyelitis. She was admitted to [facility] after presenting with HA, recent fevers to 102, fall at home, possible sz, acute encephalopathy with findings of acute bilateral embolic infarcts on imaging studies. Had VF arrest requiring defib X 3. Developed septic shock (felt 2/2 PNA vs viral syndrome) requiring pressors, VDRF. She was transferred here for further management. We are asked to see b/o AKI. Creatinine on admission to [facility] was 0.75 on 08/10/15, up to 2.7 on 12/4 day of transfer->3.9 on arrival here 12/4 and has continued to rise - up to 5.44 12/6  with minimal UOP. She was on benazepril prior to admission. Received CT [facility] at [facility]  on 12/3 but I don't believe any contrast was administered. UA there showed >300 protein, large blood. US shows large kidneys (c/w DM). UPC 1.6 gm proteinuria (c/w DM) 6-30 RBC foley specimen.    Impression/Plan 1. AKI - normal renal function at baseline (0.75) , but UA at outside hospital with proteinuria so  likely underlying diabetic nephropathy. ATN proximate cause for her AKI (shock/sepsis/arrest/ACE on board prior to presentation). CK normal so no rhabdo. No obstruction on [place], kidneys large (would be c/w DM; 1.6 gm proteinuria also fits).  Cannot r/o possibility emboli to her kidneys. Despite good UOP had HD yesterday for azotemia (to get that out of the equation re her AMS). No change in MS so far. HD again today. Have held diuretics as of 12/9 (weight was down 6-7 kg and I had concern that was too dry.) 2. Acute hypoxic resp failure/VDRF 3. Bilateral cerebral emboli with sz - dilantin. EEG - "diffuse cerebral dysfunction that is non-specific in etiology and can be seen with hypoxic/ischemic injury, toxic/metabolic encephalopathies, or medication effect. Broad sharp waves over the right frontotemporal and right frontal regions indicate possible epileptogenic potential but no electrographic seizures". 4. Encephalopathy - LP neg. Bilateral strokes. Cannot exclude anoxic brain injury from arrest. We can get "uremia" out of the picture with additional dialysis.  (I DON'T think bilat LE plegia due to uremia...)HD again today. No heparin (neuro indicates risk of hemorrhagic transformation of strokes) 5. Sepsis - fortaz/vanco/diflucan all stopped. Resolved septic shock 6. HTN - meds per CCM 7. DM2 8. AF   Jamal Maes, MD [facility] [phone] Pager 08/17/2015, 8:18 AM

## 2015-08-17 NOTE — Progress Notes (Signed)
MEDICATION RELATED CONSULT NOTE - FOLLOW UP   Pharmacy Consult for Phenytoin Indication: Seizures  Allergies  Allergen Reactions  . Sulfamethoxazole-Trimethoprim Other (See Comments)    Headaches  . Latex Itching    whelps  . Tape     Patient Measurements: Height: 5\' 6"  (167.6 cm) Weight: 245 lb 13 oz (111.5 kg) IBW/kg (Calculated) : 59.3  Vital Signs: Temp: 99.9 F (37.7 C) (12/10 0819) Temp Source: Oral (12/10 0819) BP: 171/55 mmHg (12/10 0937) Pulse Rate: 64 (12/10 0937) Intake/Output from previous day: 12/09 0701 - 12/10 0700 In: 1406 [I.V.:260; NG/GT:1080; IV Piggyback:66] Out: 2190 [Urine:2140; Stool:50] Intake/Output from this shift: Total I/O In: 85 [I.V.:10; NG/GT:75] Out: 35 [Urine:35]  Labs:  Recent Labs  08/15/15 0303 08/16/15 0250 08/17/15 0415  WBC 8.9 10.4 11.7*  HGB 9.9* 9.8* 10.0*  HCT 32.6* 32.4* 32.5*  PLT 184 224 251  CREATININE 6.71* 7.21* 5.21*  PHOS 9.2*  --  6.7*  ALBUMIN 2.1*  --  2.3*   Estimated Creatinine Clearance: 12.7 mL/min (by C-G formula based on Cr of 5.21).    Assessment: 69 yo F with new onset sz - DPH- given 1g load on 12/4 at OSH, then started on 100 TID. Switched to fospheny 12/6- given partial load 12/6 @ 1400, started on 100 TID.  Now switched back to pheny on 12/7 as high phos and 5 day limit of fosphenytoin. MRI of brain shows no change when comparing with previous. Now at [facility] with phenytoin and last corrected level this am was therapeutic at ~11 with most recent albumin low at 2.3 and poor renal function. EEG results from yesterday are abnormal due to moderate diffuse slowing and occasional broad sharp waves which indicates diffuse crerbral dysfunction with non-specific etiology. No seizures noted on EEG.  12/4 @ 2010: 4.7 with alb 2.7, corrects to 7.3 (low load given at OSH) 12/7 @ 0335: 6.4 with alb 2.1 and worsening renal fxn- corrects to 20. More reflective of fospheny load, not maint dose. 12/8 @ 0303: 5.6  with alb 2.1 and worsening renal function. Corrects to ~18 12/9 @ 0250: 5.2 with albumin 2.1 and worsening renal function. Corrects to ~17 12/10 @ 0415: 3.5 with albumin 2.3 and poor renal function receiving iHD. Corrects to ~11.  Goal of Therapy:  Prevention of seizures  Plan:  Continue phenytoin 100 mg IV TID Monitor phenytoin levels, renal function, albumin, LFTs  Elenor Quinones, PharmD, [facility] Clinical Pharmacist Pager [phone] 08/17/2015 9:53 AM

## 2015-08-17 NOTE — Progress Notes (Signed)
PULMONARY / CRITICAL CARE MEDICINE   Name: PIEPER ROLLING MRN: [id number] DOB: December 14, 1945    ADMISSION DATE:  08/09/2015  REFERRING MD:  Anselmo Pickler, D.O. [facility])  Brief:  Admitted on 12/4 after a cardiac arrest and seizure, found to have an ischemic stroke.  Remains intubated.  SUBJECTIVE:  HD yesterday No acute events    VITAL SIGNS: BP 138/53 mmHg  Pulse 60  Temp(Src) 98 F (36.7 C) (Oral)  Resp 21  Ht 5\' 6"  (1.676 m)  Wt 111.5 kg (245 lb 13 oz)  BMI 39.69 kg/m2  SpO2 99%  HEMODYNAMICS:    VENTILATOR SETTINGS: Vent Mode:  [-] PRVC FiO2 (%):  [40 %] 40 % Set Rate:  [16 bmp] 16 bmp Vt Set:  [550 mL] 550 mL PEEP:  [5 cmH20] 5 cmH20 Pressure Support:  [10 cmH20] 10 cmH20 Plateau Pressure:  [16 cmH20-19 cmH20] 17 cmH20  INTAKE / OUTPUT: I/O last 3 completed shifts: In: 2077 [I.V.:370; NG/GT:1575; IV Piggyback:132] Out: [id number] [Urine:4170; Stool:175]  PHYSICAL EXAMINATION:  Gen: chronically ill appearing on vent HENT: OP clear, ETT in place PULM: CTA B, vent supported breaths CV: Irreg irreg rhythm, normal rate, systolic murmur GI: BS+, soft, nontender Derm: no cyanosis or rash Neuro: flicker to pain on left (lifts left shoulder), no response anywhere else, does not follow commands    LABS; CBC Recent Labs     08/15/15  0303  08/16/15  0250  08/17/15  0415  WBC  8.9  10.4  11.7*  HGB  9.9*  9.8*  10.0*  HCT  32.6*  32.4*  32.5*  PLT  184  224  251    Coag's No results for input(s): APTT, INR in the last 72 hours.  BMET Recent Labs     08/15/15  0303  08/16/15  0250  08/17/15  0415  NA  143  142  143  K  3.6  3.7  4.0  CL  101  103  98*  CO2  24  26  30   BUN  96*  120*  92*  CREATININE  6.71*  7.21*  5.21*  GLUCOSE  282*  314*  306*    Electrolytes Recent Labs     08/15/15  0303  08/16/15  0250  08/17/15  0415  CALCIUM  7.9*  8.3*  8.4*  PHOS  9.2*   --   6.7*    Sepsis Markers No results for input(s):  PROCALCITON, O2SATVEN in the last 72 hours.  Invalid input(s): LACTICACIDVEN  ABG No results for input(s): PHART, PCO2ART, PO2ART in the last 72 hours.  Liver Enzymes Recent Labs     08/15/15  0303  08/17/15  0415  ALBUMIN  2.1*  2.3*    Cardiac Enzymes No results for input(s): TROPONINI, PROBNP in the last 72 hours.  Glucose Recent Labs     08/16/15  0809  08/16/15  1154  08/16/15  1545  08/16/15  1931  08/16/15  2351  08/17/15  0345  GLUCAP  323*  284*  207*  235*  275*  282*    Imaging Mr Brain Wo Contrast  08/15/2015  CLINICAL DATA:  Stroke. Diabetes and hypertension and atrial fibrillation EXAM: MRI HEAD WITHOUT CONTRAST TECHNIQUE: Multiplanar, multiecho pulse sequences of the brain and surrounding structures were obtained without intravenous contrast. COMPARISON:  MRI head 08/12/2015 FINDINGS: Multiple areas of restricted diffusion compatible with acute infarct. The largest area is in the right occipital parietal lobe and  is unchanged. Smaller areas of acute infarct in the posterior right frontal lobe unchanged. Small areas of acute infarct in the left frontal cortex, left occipital lobe, and cerebellum bilaterally unchanged from the prior study. Small areas of acute infarct in the right temporal lobe unchanged. Negative for intracranial hemorrhage.  Negative for mass or edema Mild atrophy.  Negative for hydrocephalus. Mild mastoid sinus effusion on the right. IMPRESSION: Multiple areas of acute infarction are unchanged from 08/12/2015. Negative for hemorrhage. Findings suggest cerebral emboli. Electronically Signed   By: Franchot Gallo M.D.   On: 08/15/2015 14:08   Dg Chest Port 1 View  08/17/2015  CLINICAL DATA:  Hypoxia EXAM: PORTABLE CHEST 1 VIEW COMPARISON:  August 16, 2015 FINDINGS: Endotracheal tube tip is 4.7 cm above the carina. Central catheter tip is in the superior vena cava. Nasogastric tube tip and side port are below the diaphragm. No pneumothorax. There is  atelectatic change in the left base with minimal left effusion. Lungs elsewhere clear. Heart is borderline prominent with pulmonary vascularity within normal limits. No adenopathy. No bone lesions. IMPRESSION: Tube and catheter positions as described without pneumothorax. Left base atelectasis with small left effusion. Stable slight cardiac prominence. Electronically Signed   By: Lowella Grip III M.D.   On: 08/17/2015 07:43   Dg Chest Port 1 View  08/16/2015  CLINICAL DATA:  Encounter for central line EXAM: PORTABLE CHEST 1 VIEW COMPARISON:  Earlier today FINDINGS: Endotracheal tube tip is just above the clavicular heads, positioning similar to prior. Nasogastric tube tip at the stomach. New right IJ dialysis catheter, tip at the upper SVC. No pneumothorax or new mediastinal widening. Haziness of the lower chest, greater on the left where there is probable layering pleural effusion. IMPRESSION: 1. New right IJ line without adverse finding. 2. Stable aeration compared to this morning. Electronically Signed   By: Monte Fantasia M.D.   On: 08/16/2015 13:10   Dg Chest Port 1 View  08/16/2015  CLINICAL DATA:  Acute respiratory failure with hypoxia EXAM: PORTABLE CHEST 1 VIEW COMPARISON:  Portable chest x-ray of August 15, 2015 FINDINGS: The patient is rotated toward the right on today's study. The lungs remain mildly hypoinflated. There are coarse interstitial markings at the left lung base with persistent obscuration of the hemidiaphragm. The cardiac silhouette is mildly enlarged. The pulmonary vascularity is indistinct. The endotracheal tube tip lies 4.8 cm above the carina. The esophagogastric tube tip projects below the inferior margin of the image. The observed portions of the bony thorax are unremarkable. IMPRESSION: Allowing for differences in patient positioning there has been overall improvement in the appearance of the pulmonary interstitium especially on the right. There is persistent left lower  lobe atelectasis or pneumonia with small left pleural effusion. Stable cardiomegaly. The support tubes are in reasonable position. Electronically Signed   By: David  Martinique M.D.   On: 08/16/2015 07:16   Dg Fluoro Guide Lumbar Puncture  08/15/2015  CLINICAL DATA:  Encephalitis EXAM: DIAGNOSTIC LUMBAR PUNCTURE UNDER FLUOROSCOPIC GUIDANCE FLUOROSCOPY TIME:  Radiation Exposure Index (as provided by the fluoroscopic device): 228 microGy*m^2 PROCEDURE: PROCEDURE I discussed the risks (including hemorrhage, infection, headache, and nerve damage, among others), benefits, and alternatives to fluoroscopically guided lumbar puncture with the patient's husband by telephone. We specifically discussed the high technical likelihood of success of the procedure. The patient's husband understood and elected for the patient to undergo the procedure. I reviewed the MRI lumbar spine from 08/12/2015 in determining a suitable level for puncture.  Standard time-out was employed. Following sterile skin prep and local anesthetic administration consisting of 1 percent lidocaine, a 20 gauge 5 inch spinal needle was advanced without difficulty into the thecal sac at the at the L5-S1 level from a slightly were left paramedian approach. Clear CSF was returned. Opening pressure was not obtained -the patient is on a ventilator and turning the patient with the needle in position would be problematic. 12 cc of clear CSF was collected. The needle was subsequently removed and the skin cleansed and bandaged. No immediate complications were observed. IMPRESSION: 1. Successful lumbar puncture at the L5-S1 level yielding 12 cc of clear CSF. Electronically Signed   By: Van Clines M.D.   On: 08/15/2015 15:04       STUDIES:  12/03 CT head [facility]) >> atrophy 12/04 CT head >> acute Rt parietal occipital infarct, Rt cerebellar infarct 12/04 U/S Abd >> unremarkable appearance of kidneys 12/05 MRI/MRA brain >> numerous b/l infarcts  concerning for embolic infarcts [id number] Echo >> EF 50%, diffuse hypokinesis, grade 1 diastolic dysfx, mod AS, mod LA dilation 12/05 EEG >> generalized slowing 12/05 MRI lumbar spine >> L2-3, L3-4 spinal stenosis, degenerative arthropathy L3-4, L4-5 12/8 MRI brain >> no change in acute infarcts  12/8 LP > only 4 WBC, 30 RBC, glucose 162, Protein normal 12/8 EEG > generalized slowing, occassional broad spike  CULTURES: 12/03 Blood ([facility]) >> 12/03 Urine ([facility]) >> 12/04 Blood >> 12/04 Respiratory viral panel >> negative 12/04 Pneumococcal Ag >> negative 12/05 CSF >> Negative 12/06 Legionella Ag >> negative 12/8 CSF >> NGTD 12/8 CSF HSV PCR > negative  ANTIBIOTICS: 12/04 Tressie Ellis >> 12/8 12/04 Ampicillin >> 12/07 12/04 Vancomycin >>  12/8 12/06 Diflucan >>12/8 12/10 Doxycycline >>   SIGNIFICANT EVENTS: 12/03 Admit to OSH w/ Seizure & Fall 12/04 Transfer to [facility]; weaned off pressors; neurology consulted 12/05 Difficulty with LP due to spinal stenosis >> only cultures sent; add HCO3 to IV fluid 12/06 Nephrology consulted; d/c HCO3 from IV fluid 12/9 Start HD  LINES/TUBES: 12/03 ETT ([facility]) >> 12/9 R IJ HD cath >   DISCUSSION: 69 yo female presented to [facility] with 2 weeks of HA, and had developed fever up to 102F.  She had fall at home with Rtward gaze and Lt sided weakness.  She is reported to have seizure at [facility].  There is also report of ventricular fibrillation at [facility] >> ?if this was ECG artifact in setting of seizure.  She has hx of DM, HTN, TIA, Hypothyroidism, Neuropathy, Aortic stenosis, CAD, GERD, A fib, Rt foot osteomyelitis.  12/10 > still no improvement after one session of HD  ASSESSMENT / PLAN:  NEUROLOGIC A:   Acute encephalopathy with status epilepticus likely from acute embolic infarcts, [id number], no improvement after one session HD Hx of DM neuropathy  P:   RASS goal 0 Discuss further HD with renal AED's per neurology  Hold outpt  cymbalta  PULMONARY A: Acute hypoxic respiratory failure and concern for pneumonia >> aspiration? Interstitial edema with pleural effusions > improved P:   PSV as tolerated today but not planning to extubate until mental status improved F/u CXR  CARDIOVASCULAR A:  Hx of A fib >> not on anticoagulation as outpt Hx of HTN, CAD, HLD, mod aortic stenosis VF arrest at outside hospital >> happened with seizure, might have been ECG artifact in setting of seizure Elevated troponin >> demand ischemia Intermittent bradycardia Hypertension P:  Tele Continue metoprolol Add scheduled hydralazine Defer cardiology assessment for  now until mental status improves Continue norvasc, lipitor, ASA Hold outpt cozaar Plan to start anticoagulation around day 7 after stroke  RENAL A:   AKI with oliguria >> baseline creatinine 0.8 from August 123456, cause uncertain? ?Uremia P:   Replace electrolytes as needed HD per renal Appreciate renal input  GASTROINTESTINAL A:   Nutrition P:   Tube feeds Protonix for SUP  HEMATOLOGIC A:   Anemia of critical illness and chronic disease P:  F/u CBC SCD's, SQ heparin for DVT prevention Monitor for bleeding  INFECTIOUS A:   Septic shock >> likely from pneumonia with possible viral prodrome> resolved Doubt meningitis/encephalitis Chronic Rt foot osteomyelitis Yeast UTI P:   Monitor off of antibiotics Restart home doxycycline for osteomyelitis  ENDOCRINE A:   Hx of DM, hypothyroidism Worsening hyperglycemia P:   SSI > change to resistant scale Hold outpt glimepiride, metformin Continue synthroid Increase glargine 12/10  Husband updated bedside by me 12/9  CC time 34 minutes  Roselie Awkward, MD [facility] Pager: [phone] Cell: [phone] After 3pm or if no response, call [phone]

## 2015-08-17 NOTE — Progress Notes (Signed)
STROKE TEAM PROGRESS NOTE   SUBJECTIVE (INTERVAL HISTORY) Her RN is at the bedside. She has no significant change since yesterday. HD planned for today.  Still open eyes but not following commands. Cr improved.  BUN improved to 92. HD planned for today.   OBJECTIVE Temp:  [97.6 F (36.4 C)-100.4 F (38 C)] 98 F (36.7 C) (12/10 0345) Pulse Rate:  [53-79] 60 (12/10 0700) Cardiac Rhythm:  [-] Atrial fibrillation (12/10 0400) Resp:  [17-24] 21 (12/10 0700) BP: (97-190)/(43-100) 138/53 mmHg (12/10 0700) SpO2:  [98 %-100 %] 99 % (12/10 0700) FiO2 (%):  [40 %] 40 % (12/10 0400) Weight:  [111.5 kg (245 lb 13 oz)] 111.5 kg (245 lb 13 oz) (12/10 0401)  CBC:   Recent Labs Lab 08/16/15 0250 08/17/15 0415  WBC 10.4 11.7*  NEUTROABS 8.9* 9.4*  HGB 9.8* 10.0*  HCT 32.4* 32.5*  MCV 88.0 89.0  PLT 224 [id number]    Basic Metabolic Panel:   Recent Labs Lab 08/13/15 0238 08/14/15 0335 08/15/15 0303 08/16/15 0250 08/17/15 0415  NA 143 142 143 142 143  K 4.0 3.6 3.6 3.7 4.0  CL 108 105 101 103 98*  CO2 22 22 24 26 30   GLUCOSE 185* 194* 282* 314* 306*  BUN 60* 76* 96* 120* 92*  CREATININE 5.44* 6.06* 6.71* 7.21* 5.21*  CALCIUM 7.1* 7.1* 7.9* 8.3* 8.4*  MG 1.5* 2.0  --   --   --   PHOS 7.6* 7.9* 9.2*  --  6.7*    Lipid Panel:     Component Value Date/Time   CHOL 127 08/13/2015 0238   TRIG 191* 08/13/2015 0238   TRIG 195* 08/13/2015 0238   HDL 28* 08/13/2015 0238   CHOLHDL 4.5 08/13/2015 0238   VLDL 39 08/13/2015 0238   LDLCALC 60 08/13/2015 0238   HgbA1c:  Lab Results  Component Value Date   HGBA1C 7.4* 08/12/2015   Urine Drug Screen:     Component Value Date/Time   LABOPIA POSITIVE* 08/12/2015 1430   COCAINSCRNUR NONE DETECTED 08/12/2015 1430   LABBENZ POSITIVE* 08/12/2015 1430   AMPHETMU NONE DETECTED 08/12/2015 1430   THCU NONE DETECTED 08/12/2015 1430   LABBARB NONE DETECTED 08/12/2015 1430      IMAGING I have personally reviewed the radiological images below  and agree with the radiology interpretations.  CT HEAD 08/17/2015   Acute moderate RIGHT parietal occipital infarct (watershed versus angular branch of the MCA). Small acute RIGHT cerebellar infarct. Small area RIGHT frontal encephalomalacia may be posttraumatic or ischemic.   US Abdomen Complete 08/21/2015  1. Status post cholecystectomy. No acute abnormality seen within the abdomen. 2. Kidneys unremarkable in appearance. 3. Scattered calcific atherosclerotic disease along the abdominal aorta.     MRI and MRA HEAD 08/12/2015  Exam is motion degraded. Numerous bilateral acute/subacute nonhemorrhagic infarcts. Most confluent infarcts have an appearance of acute/subacute infarct involves portions of the right frontal -parietal - occipital and posterior right temporal lobe. Swelling of gyri without significant local mass effect. Smaller acute nonhemorrhagic infarcts scattered throughout the hemispheres bilaterally (frontal lobes, parietal lobes, occipital lobes), right temporal lobe/subinsular region and cerebellum bilaterally. The involvement of multiple vascular distributions raises possibility of embolic disease. Remote small right cerebellar infarct. Exam is slightly motion degraded. Mild irregularity and slight narrowing cavernous segment right internal carotid artery. Small bulge may be related to atherosclerotic type changes rather than aneurysm. No significant stenosis of either carotid terminus or M1 segment of either middle cerebral artery. Middle cerebral artery  branch vessel irregularity and narrowing bilaterally. Mild narrowing and irregularity A1 segment right anterior cerebral artery. Bulge of the distal A1 segment of the right anterior cerebral artery appears to be origin of a vessel rather than saccular aneurysm. Mild to moderate narrowing A2 segment right anterior cerebral artery. Left vertebral artery is dominant. Mild to moderate narrowing distal right vertebral artery. Mild to moderate  narrowing portions of the posterior inferior cerebellar artery bilaterally. Mild to moderate narrowing portions of the anterior inferior cerebellar artery bilaterally. Narrowing of the superior cerebellar artery more notable on the left. Mild to moderate narrowing portions of the proximal, mid and distal aspect of the posterior cerebral artery bilaterally. Tiny bulge superior margin P1 segments left posterior cerebral artery. Question tiny aneurysm (less than 2 mm). A vessel may rise from this region.   Mr Brain Wo Contrast  08/15/2015  IMPRESSION: Multiple areas of acute infarction are unchanged from 08/12/2015. Negative for hemorrhage. Findings suggest cerebral emboli.  14:08   CUS - Bilateral: 1-39% ICA stenosis. Vertebral artery flow is antegrade.  2D echo - Normal LV size with EF 50%, diffuse hypokinesis. Mildly dilated RV with normal systolic function. Biatrial enlargement. Moderate aortic stenosis. Dilated IVC suggests elevated RV filling pressure. Mild pulmonary hypertension.  Mr Lumbar Spine Wo Contrast  08/12/2015  IMPRESSION: No complications seen following unsuccessful lumbar puncture. Mild multifactorial spinal stenosis at L2-3 and L3-4 could have contributed to the difficulty at at lumbar puncture. Degenerative facet arthropathy at L3-4 and L4-5. Should a lumbar puncture be repeated, greater likelihood of success might be obtained at the L4-5 or L5-S1 level.  Dg Chest Port 1 View 08/16/2015  IMPRESSION: Allowing for differences in patient positioning there has been overall improvement in the appearance of the pulmonary interstitium especially on the right. There is persistent left lower lobe atelectasis or pneumonia with small left pleural effusion. Stable cardiomegaly. The support tubes are in reasonable position.   08/13/2015  IMPRESSION: 1. Persistent left lower lobe atelectasis and small left pleural effusion. Stable mild low-grade CHF. The endotracheal tube is in reasonable  position. 2. The nasogastric tube tip and proximal port lie in the region of the gastric body below the expected location of the GE junction.   EEG - This EEG is abnormal with severe generalized continuous nonspecific slowing cerebral activity. This pattern of slowing can be seen with metabolic and toxic encephalopathies, as well as with severe degenerative central nervous system disorders. No evidence of epileptiform activity was recorded.   Repeat EEG  08/16/2015 Impression: This EEG is abnormal due to the presence of: 1. Moderate diffuse slowing of the background 2. Occasional broad sharp waves over the right frontotemporal and right frontal regions    Physical exam  Temp:  [97.6 F (36.4 C)-100.4 F (38 C)] 98 F (36.7 C) (12/10 0345) Pulse Rate:  [53-79] 60 (12/10 0700) Resp:  [17-24] 21 (12/10 0700) BP: (97-190)/(43-100) 138/53 mmHg (12/10 0700) SpO2:  [98 %-100 %] 99 % (12/10 0700) FiO2 (%):  [40 %] 40 % (12/10 0400) Weight:  [111.5 kg (245 lb 13 oz)] 111.5 kg (245 lb 13 oz) (12/10 0401)  General - Well nourished, well developed HEENT: NCAT; PERRL; sclera slightly injected; intubated Cardiovascular - irregular rhythm and rate. Pulmonary: CTA Abdomen: soft: ND Extremities: ankle edema  Neuro Mental Status:  Opens eyes spontaneously; does not follow commands  Cranial Nerves PERRL; no blink to threat; OCRs intact; eye closure grossly symmetric.  Spontaneous cough; breathing over the vent  Motor/Sensory:  Left UE with extn to noxious stimuli; right upper extremity with w/d to noxious stimuli; right LE slight w/d and left LE no response to noxious stimuli  Coordination/Gait: Deferred due to LOC  ASSESSMENT/PLAN Melissa Becker is a 69 y.o. female with history of DM, HTN, CAD, atrial fibrillation, TIA, GI bleed presenting with complaints of a HA who fell at home due to weakness who developed seizures followed by v fib/cardiac arrest at an outlying hospital. She was  intubated and transferred to done where she was found to have a R parietal occipital infarct and small R cerebellar infarct. She did not receive IV t-PA due to unknown time last known well.   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 Tiny L PCA aneurysm  Repeat MRI brain 08/15/15 no change  Carotid Doppler  Unremarkable   2D Echo  EF 50%  LDL 60  HgbA1c 7.4  Heparin 5000 units sq tid for VTE prophylaxis Diet NPO time specified  No antithrombotic prior to admission, now on ASA 325mg  for stroke prevention. No AC at this time due to risk of hemorrhagic transformation in the setting of large infarcts at right temporal lobe. Will consider AC in 5-7 days post stroke and if no planned procedures.   Ongoing aggressive stroke risk factor management  Therapy recommendations:  pending  Disposition:  pending  Seizure  Likely secondary to cortical infarcts  Dilantin level low at 6.4 -> 5.6->5.2, but corrected level 11  On dilantin 100mg  tid  EEG no seizure  Repeat EEG today on 08/16/2015 did not show seizure   Dilantin level daily 08/17/2015 - 3.5; corrected to 11.  Uremic encephalopathy  Pt still open eyes but not tracking and not moving LEs as before  MRI no acute changes comparing with initial MRI  LP not supportive for CNS infection   Neuro change likely due to uremic encephalopathy  AKI and uremia  CK 134  Cre 3.91 -> 4.26 -> 5.44-> 6.06-> 6.71->7.21-->5.2  BUN 120 today  Nephrology on board  On HD   ? Sepsis  procalcitonin 19.06 -> 16.71->12.60  afebrile  WBC 13.5 -> 9.8->8.9->8.9->10.4 -> 11.7 (temp  98); will monitor  Off antibiotics  CSF not supportive for CNS infection  Pt afebrile, WBC normolized  Vent dependent respiratory failure  Intubated  CCM managing  Likely need trach soon  Atrial Fibrillation  Home anticoagulation:  No antithrombotics at home  CHA2DS2-VASc Score = 6, ?2 oral anticoagulation recommended  Age in Years:  65-74   +1   Sex:  Female   +1    Hypertension History: yes   +1     Diabetes Mellitus:  yes   +1   Congestive Heart Failure History:  0  Vascular Disease History:  0     Stroke/TIA/Thromboembolism History: yes   +2  On ASA for now. Hold off AC for 7 days to avoid hemorrhagic transformation and if no planned procedures.  Would reconsider on Monday.  Possibly repeat CT scan then to ensure no medical contraindication to [facility]    Hypertension  Still elevated likely due to AKI  BP goal normotensive.  Hyperlipidemia  Home meds:  lipitor 80 resumed  in hospital  LDL 60, goal < 70  Continue statin at discharge  Diabetes type II  HgbA1c 7.4, goal < 7.0  Not controlled  SSI  Other Stroke Risk Factors  Advanced age  Former Cigarette smoker, quit smoking 22 years ago   Morbid Obesity, Body mass index is 39.69 kg/(m^2).   Hx stroke/TIA  Coronary artery disease  Aortic stenosis  Anemia H/H  10.0 / 32.5   Other Active Problems  Hypothyroid  Osteomyelitis R foot  Hospital day # 6  ATTENDING NOTE: Patient was seen and examined by me personally. Documentation accurately reflects findings. The laboratory and radiographic studies reviewed by me.  ROS completed by me personally and pertinent positives could not be documented due to LOC  Assessment and plan completed by me personally:  Continue to follow dilantin levels  Ordered ammonia level for encephalopathy work-up  RN reports cardiac dysrthymia  During HD.  Risk of AC remains high.  Would hold for 7 days Condition is unchanged    SIGNED BY: Dr. Elissa Hefty       To contact Stroke Continuity provider, please refer to [email or web address] After hours, contact General Neurology

## 2015-08-18 ENCOUNTER — Inpatient Hospital Stay (HOSPITAL_COMMUNITY): Payer: Medicare Other

## 2015-08-18 DIAGNOSIS — G0490 Encephalitis and encephalomyelitis, unspecified: Secondary | ICD-10-CM

## 2015-08-18 LAB — CBC WITH DIFFERENTIAL/PLATELET
BASOS ABS: 0 10*3/uL (ref 0.0–0.1)
Basophils Relative: 0 %
EOS ABS: 0.2 10*3/uL (ref 0.0–0.7)
EOS PCT: 2 %
HCT: 30.7 % — ABNORMAL LOW (ref 36.0–46.0)
Hemoglobin: 8.9 g/dL — ABNORMAL LOW (ref 12.0–15.0)
LYMPHS PCT: 9 %
Lymphs Abs: 0.9 10*3/uL (ref 0.7–4.0)
MCH: 26.3 pg (ref 26.0–34.0)
MCHC: 29 g/dL — ABNORMAL LOW (ref 30.0–36.0)
MCV: 90.8 fL (ref 78.0–100.0)
Monocytes Absolute: 0.7 10*3/uL (ref 0.1–1.0)
Monocytes Relative: 7 %
NEUTROS PCT: 82 %
Neutro Abs: 8.5 10*3/uL — ABNORMAL HIGH (ref 1.7–7.7)
PLATELETS: 222 10*3/uL (ref 150–400)
RBC: 3.38 MIL/uL — AB (ref 3.87–5.11)
RDW: 15.3 % (ref 11.5–15.5)
WBC: 10.3 10*3/uL (ref 4.0–10.5)

## 2015-08-18 LAB — URINE MICROSCOPIC-ADD ON

## 2015-08-18 LAB — PHENYTOIN LEVEL, TOTAL: Phenytoin Lvl: <2.5 ug/mL — ABNORMAL LOW (ref 10.0–20.0)

## 2015-08-18 LAB — RENAL FUNCTION PANEL
Albumin: 2.2 g/dL — ABNORMAL LOW (ref 3.5–5.0)
Anion gap: 14 (ref 5–15)
BUN: 91 mg/dL — AB (ref 6–20)
CHLORIDE: 99 mmol/L — AB (ref 101–111)
CO2: 28 mmol/L (ref 22–32)
Calcium: 8.5 mg/dL — ABNORMAL LOW (ref 8.9–10.3)
Creatinine, Ser: 4.65 mg/dL — ABNORMAL HIGH (ref 0.44–1.00)
GFR calc Af Amer: 10 mL/min — ABNORMAL LOW (ref >60)
GFR, EST NON AFRICAN AMERICAN: 9 mL/min — AB (ref >60)
Glucose, Bld: 247 mg/dL — ABNORMAL HIGH (ref 65–99)
POTASSIUM: 3.7 mmol/L (ref 3.5–5.1)
Phosphorus: 6.1 mg/dL — ABNORMAL HIGH (ref 2.5–4.6)
Sodium: 141 mmol/L (ref 135–145)

## 2015-08-18 LAB — URINALYSIS, ROUTINE W REFLEX MICROSCOPIC
Bilirubin Urine: NEGATIVE
Glucose, UA: NEGATIVE mg/dL
Hgb urine dipstick: NEGATIVE
Ketones, ur: NEGATIVE mg/dL
Nitrite: NEGATIVE
PROTEIN: 100 mg/dL — AB
Specific Gravity, Urine: 1.017 (ref 1.005–1.030)
pH: 5 (ref 5.0–8.0)

## 2015-08-18 LAB — GLUCOSE, CAPILLARY
GLUCOSE-CAPILLARY: 205 mg/dL — AB (ref 65–99)
GLUCOSE-CAPILLARY: 224 mg/dL — AB (ref 65–99)
Glucose-Capillary: 198 mg/dL — ABNORMAL HIGH (ref 65–99)
Glucose-Capillary: 214 mg/dL — ABNORMAL HIGH (ref 65–99)
Glucose-Capillary: 247 mg/dL — ABNORMAL HIGH (ref 65–99)

## 2015-08-18 MED ORDER — PHENYTOIN SODIUM 50 MG/ML IJ SOLN
100.0000 mg | Freq: Once | INTRAMUSCULAR | Status: AC
  Administered 2015-08-18: 100 mg via INTRAVENOUS
  Filled 2015-08-18: qty 2

## 2015-08-18 MED ORDER — SODIUM CHLORIDE 0.9 % IV SOLN
200.0000 mg | Freq: Two times a day (BID) | INTRAVENOUS | Status: DC
  Administered 2015-08-18 – 2015-08-21 (×6): 200 mg via INTRAVENOUS
  Filled 2015-08-18 (×13): qty 4

## 2015-08-18 MED ORDER — INSULIN GLARGINE 100 UNIT/ML ~~LOC~~ SOLN
45.0000 [IU] | Freq: Every day | SUBCUTANEOUS | Status: DC
  Administered 2015-08-19 – 2015-08-20 (×2): 45 [IU] via SUBCUTANEOUS
  Filled 2015-08-18 (×3): qty 0.45

## 2015-08-18 NOTE — Progress Notes (Signed)
MEDICATION RELATED CONSULT NOTE - FOLLOW UP  Pharmacy Consult for Phenytoin Indication: Seizures  Allergies  Allergen Reactions  . Sulfamethoxazole-Trimethoprim Other (See Comments)    Headaches  . Latex Itching    whelps  . Tape     Patient Measurements: Height: 5\' 6"  (167.6 cm) Weight: 234 lb 2.1 oz (106.2 kg) IBW/kg (Calculated) : 59.3  Vital Signs: Temp: 100.8 F (38.2 C) (12/11 0829) Temp Source: Oral (12/11 0829) BP: 120/50 mmHg (12/11 0734) Pulse Rate: 74 (12/11 0734) Intake/Output from previous day: 12/10 0701 - 12/11 0700 In: 1450 [I.V.:230; NG/GT:1220] Out: 780 [Urine:580; Stool:200] Intake/Output from this shift:    Labs:  Recent Labs  08/16/15 0250 08/17/15 0415 08/18/15 0500  WBC 10.4 11.7*  --   HGB 9.8* 10.0*  --   HCT 32.4* 32.5*  --   PLT 224 251  --   CREATININE 7.21* 5.21* 4.65*  PHOS  --  6.7* 6.1*  ALBUMIN  --  2.3* 2.2*   Estimated Creatinine Clearance: 13.9 mL/min (by C-G formula based on Cr of 4.65).    Assessment: 69 yo F with new onset sz - DPH- given 1g load on 12/4 at OSH, then started on 100 TID. Switched to fospheny 12/6- given partial load 12/6 @ 1400, started on 100 TID.  Now switched back to pheny on 12/7 as high phos and 5 day limit of fosphenytoin. MRI of brain shows no change when comparing with previous. EEG results from 12/9 are abnormal due to moderate diffuse slowing and occasional broad sharp waves which indicates diffuse crerbral dysfunction with non-specific etiology. No seizures noted on EEG. Last total phenytoin level is now < 2.5 which corrects ~7-8. Talked with Neuro and will plan to increase phenytoin to 400mg /day in divided doses.  12/4 @ 2010: 4.7 with alb 2.7, corrects to 7.3 (low load given at OSH) 12/7 @ 0335: 6.4 with alb 2.1 and worsening renal fxn- corrects to 20. More reflective of fospheny load, not maint dose. 12/8 @ 0303: 5.6 with alb 2.1 and worsening renal function. Corrects to ~18 12/9 @ 0250: 5.2  with albumin 2.1 and worsening renal function. Corrects to ~17 12/10 @ 0415: 3.5 with albumin 2.3 and poor renal function receiving iHD. Corrects to ~11. 12/11 @ 0500: < 2.5 (No doses missed) Albumin 2.2 and poor renal function. Corrects low to < 8  Goal of Therapy:  Prevention of seizures  Plan:  Increase phenytoin to 200mg  IV Q12 tonight Monitor phenytoin levels, renal function, albumin, LFTs Phenytoin Css with new dose will be around 12/16 Could consider checking free phenytoin level with low albumin and poor renal function if needed  Elenor Quinones, PharmD, [facility] Clinical Pharmacist Pager [phone] 08/18/2015 10:21 AM

## 2015-08-18 NOTE — Progress Notes (Signed)
ETT advanced 4cm per MD order.  Currently placed at 25 at the lip.  Will continue to monitor.

## 2015-08-18 NOTE — Progress Notes (Signed)
STROKE TEAM PROGRESS NOTE   SUBJECTIVE (INTERVAL HISTORY) Her RN is at the bedside. She has no significant change since yesterday. Still opens eyes but not following commands.    OBJECTIVE Temp:  [98.4 F (36.9 C)-100.8 F (38.2 C)] 100.8 F (38.2 C) (12/11 0829) Pulse Rate:  [31-148] 78 (12/11 0600) Cardiac Rhythm:  [-] Atrial fibrillation;Heart block (12/11 0400) Resp:  [17-27] 21 (12/11 0600) BP: (88-173)/(43-71) 162/50 mmHg (12/11 0600) SpO2:  [95 %-100 %] 99 % (12/11 0600) FiO2 (%):  [40 %] 40 % (12/11 0400) Weight:  [106.2 kg (234 lb 2.1 oz)] 106.2 kg (234 lb 2.1 oz) (12/11 0500)  CBC:   Recent Labs Lab 08/16/15 0250 08/17/15 0415  WBC 10.4 11.7*  NEUTROABS 8.9* 9.4*  HGB 9.8* 10.0*  HCT 32.4* 32.5*  MCV 88.0 89.0  PLT 224 [id number]    Basic Metabolic Panel:   Recent Labs Lab 08/13/15 0238 08/14/15 0335  08/17/15 0415 08/18/15 0500  NA 143 142  < > 143 141  K 4.0 3.6  < > 4.0 3.7  CL 108 105  < > 98* 99*  CO2 22 22  < > 30 28  GLUCOSE 185* 194*  < > 306* 247*  BUN 60* 76*  < > 92* 91*  CREATININE 5.44* 6.06*  < > 5.21* 4.65*  CALCIUM 7.1* 7.1*  < > 8.4* 8.5*  MG 1.5* 2.0  --   --   --   PHOS 7.6* 7.9*  < > 6.7* 6.1*  < > = values in this interval not displayed.  Lipid Panel:     Component Value Date/Time   CHOL 127 08/13/2015 0238   TRIG 191* 08/13/2015 0238   TRIG 195* 08/13/2015 0238   HDL 28* 08/13/2015 0238   CHOLHDL 4.5 08/13/2015 0238   VLDL 39 08/13/2015 0238   LDLCALC 60 08/13/2015 0238   HgbA1c:  Lab Results  Component Value Date   HGBA1C 7.4* 08/12/2015   Urine Drug Screen:     Component Value Date/Time   LABOPIA POSITIVE* 08/12/2015 1430   COCAINSCRNUR NONE DETECTED 08/12/2015 1430   LABBENZ POSITIVE* 08/12/2015 1430   AMPHETMU NONE DETECTED 08/12/2015 1430   THCU NONE DETECTED 08/12/2015 1430   LABBARB NONE DETECTED 08/12/2015 1430      IMAGING I have personally reviewed the radiological images below and agree with the  radiology interpretations.  CT HEAD 08/17/2015   Acute moderate RIGHT parietal occipital infarct (watershed versus angular branch of the MCA). Small acute RIGHT cerebellar infarct. Small area RIGHT frontal encephalomalacia may be posttraumatic or ischemic.   US Abdomen Complete 08/25/2015  1. Status post cholecystectomy. No acute abnormality seen within the abdomen. 2. Kidneys unremarkable in appearance. 3. Scattered calcific atherosclerotic disease along the abdominal aorta.     MRI and MRA HEAD 08/12/2015  Exam is motion degraded. Numerous bilateral acute/subacute nonhemorrhagic infarcts. Most confluent infarcts have an appearance of acute/subacute infarct involves portions of the right frontal -parietal - occipital and posterior right temporal lobe. Swelling of gyri without significant local mass effect. Smaller acute nonhemorrhagic infarcts scattered throughout the hemispheres bilaterally (frontal lobes, parietal lobes, occipital lobes), right temporal lobe/subinsular region and cerebellum bilaterally. The involvement of multiple vascular distributions raises possibility of embolic disease. Remote small right cerebellar infarct. Exam is slightly motion degraded. Mild irregularity and slight narrowing cavernous segment right internal carotid artery. Small bulge may be related to atherosclerotic type changes rather than aneurysm. No significant stenosis of either carotid terminus or M1  segment of either middle cerebral artery. Middle cerebral artery branch vessel irregularity and narrowing bilaterally. Mild narrowing and irregularity A1 segment right anterior cerebral artery. Bulge of the distal A1 segment of the right anterior cerebral artery appears to be origin of a vessel rather than saccular aneurysm. Mild to moderate narrowing A2 segment right anterior cerebral artery. Left vertebral artery is dominant. Mild to moderate narrowing distal right vertebral artery. Mild to moderate narrowing portions of  the posterior inferior cerebellar artery bilaterally. Mild to moderate narrowing portions of the anterior inferior cerebellar artery bilaterally. Narrowing of the superior cerebellar artery more notable on the left. Mild to moderate narrowing portions of the proximal, mid and distal aspect of the posterior cerebral artery bilaterally. Tiny bulge superior margin P1 segments left posterior cerebral artery. Question tiny aneurysm (less than 2 mm). A vessel may rise from this region.   Mr Brain Wo Contrast  08/15/2015  IMPRESSION: Multiple areas of acute infarction are unchanged from 08/12/2015. Negative for hemorrhage. Findings suggest cerebral emboli.  14:08   CUS - Bilateral: 1-39% ICA stenosis. Vertebral artery flow is antegrade.  2D echo - Normal LV size with EF 50%, diffuse hypokinesis. Mildly dilated RV with normal systolic function. Biatrial enlargement. Moderate aortic stenosis. Dilated IVC suggests elevated RV filling pressure. Mild pulmonary hypertension.  Mr Lumbar Spine Wo Contrast  08/12/2015  IMPRESSION: No complications seen following unsuccessful lumbar puncture. Mild multifactorial spinal stenosis at L2-3 and L3-4 could have contributed to the difficulty at at lumbar puncture. Degenerative facet arthropathy at L3-4 and L4-5. Should a lumbar puncture be repeated, greater likelihood of success might be obtained at the L4-5 or L5-S1 level.  Dg Chest Port 1 View 08/16/2015  IMPRESSION: Allowing for differences in patient positioning there has been overall improvement in the appearance of the pulmonary interstitium especially on the right. There is persistent left lower lobe atelectasis or pneumonia with small left pleural effusion. Stable cardiomegaly. The support tubes are in reasonable position.   08/13/2015  IMPRESSION: 1. Persistent left lower lobe atelectasis and small left pleural effusion. Stable mild low-grade CHF. The endotracheal tube is in reasonable position. 2. The  nasogastric tube tip and proximal port lie in the region of the gastric body below the expected location of the GE junction.   EEG - This EEG is abnormal with severe generalized continuous nonspecific slowing cerebral activity. This pattern of slowing can be seen with metabolic and toxic encephalopathies, as well as with severe degenerative central nervous system disorders. No evidence of epileptiform activity was recorded.   Repeat EEG  08/16/2015 Impression: This EEG is abnormal due to the presence of: 1. Moderate diffuse slowing of the background 2. Occasional broad sharp waves over the right frontotemporal and right frontal regions   Portable chest x-ray 1 view 08/18/2015 1. Support apparatus satisfactory. 2. Improved aeration in the lung bases since yesterday, with only mild atelectasis persisting at the right base. 3.No acute cardiopulmonary disease otherwise.   Physical exam  Temp:  [98.4 F (36.9 C)-100.8 F (38.2 C)] 100.8 F (38.2 C) (12/11 0829) Pulse Rate:  [31-148] 78 (12/11 0600) Resp:  [17-27] 21 (12/11 0600) BP: (88-173)/(43-71) 162/50 mmHg (12/11 0600) SpO2:  [95 %-100 %] 99 % (12/11 0600) FiO2 (%):  [40 %] 40 % (12/11 0400) Weight:  [106.2 kg (234 lb 2.1 oz)] 106.2 kg (234 lb 2.1 oz) (12/11 0500)  General - Well nourished, well developed HEENT: NCAT; PERRL; sclera slightly injected; intubated Cardiovascular - irregular rhythm and rate.  Pulmonary: CTA Abdomen: soft: ND Extremities: ankle edema  Neuro Mental Status:  Opens eyes spontaneously; does not follow commands  Cranial Nerves PERRL; no blink to threat; OCRs intact; eye closure grossly symmetric.  Spontaneous cough; breathing over the vent  Motor/Sensory: Left UE with extn to noxious stimuli; right upper extremity with w/d to noxious stimuli; right LE slight w/d and left LE no response to noxious stimuli  Coordination/Gait: Deferred due to LOC    ASSESSMENT/PLAN Ms. NIVEAH NOLLE is a 69  y.o. female with history of DM, HTN, CAD, atrial fibrillation, TIA, GI bleed presenting with complaints of a HA who fell at home due to weakness who developed seizures followed by v fib/cardiac arrest at an outlying hospital. She was intubated and transferred to done where she was found to have a R parietal occipital infarct and small R cerebellar infarct. She did not receive IV t-PA due to unknown time last known well.   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 Tiny L PCA aneurysm  Repeat MRI brain 08/15/15 no change  Carotid Doppler  Unremarkable   Ammonia level 22 (WNL) on 08/17/2015  2D Echo  EF 50%  LDL 60  HgbA1c 7.4  Heparin 5000 units sq tid for VTE prophylaxis Diet NPO time specified  No antithrombotic prior to admission, now on ASA 325mg  for stroke prevention. No AC at this time due to risk of hemorrhagic transformation in the setting of large infarcts at right temporal lobe. Will consider AC in 5-7 days post stroke and if no planned procedures.   Ongoing aggressive stroke risk factor management  Therapy recommendations:  pending  Disposition:  pending  Seizure  Likely secondary to cortical infarcts  EEG no seizure  Repeat EEG today on 08/16/2015 did not show seizure   Dilantin level daily 08/18/2015 - <2.5; corrected level 7 or 8  Discussed with pharmacist and Dr Belenda Cruise - increase dose to 200 mg Q12 hours.  Check free level in AM tomorrow.  May need to give additional dose after dialysis if level is hard to maintain  Uremic encephalopathy  Pt still open eyes but not tracking and not moving LEs as  before  MRI no acute changes comparing with initial MRI  LP not supportive for CNS infection   Neuro change likely due to uremic encephalopathy  Ammonia level 22 (WNL) on 08/17/2015  AKI and uremia  CK 134  Cre 3.91 -> 4.26 -> 5.44-> 6.06-> 6.71->7.21-->5.2 -> 4.65  BUN 91 Sunday  Nephrology on board  On HD   ? Sepsis  procalcitonin 19.06 -> 16.71->12.60  afebrile  Temp 100.8  today (08/18/2015) CBC with diff. Pending  Portable chest x-ray today - improved (see above)  CSF cultures no growth so far  Check urine  Off antibiotics  CSF not supportive for CNS infection  Pt afebrile, WBC normolized  Vent dependent respiratory failure  Intubated  CCM managing  Likely need trach soon  Atrial Fibrillation  Home anticoagulation:  No antithrombotics at home  CHA2DS2-VASc Score = 6, ?2 oral anticoagulation recommended  Age [age] Years:  69-74   +1   Sex:  Female   +1    Hypertension History:  yes   +1     Diabetes Mellitus:  yes   +1   Congestive Heart Failure History:  0  Vascular Disease History:  0     Stroke/TIA/Thromboembolism History: yes   +2  On ASA for now. Hold off AC for 7 days to avoid hemorrhagic transformation and if no planned procedures.  Would reconsider on Monday.  Possibly repeat CT scan then to ensure no medical contraindication to [facility]    Hypertension  Still elevated likely due to AKI  BP goal normotensive.  Hyperlipidemia  Home meds:  lipitor 80 resumed in hospital  LDL 60, goal < 70  Continue statin at discharge  Diabetes type II  HgbA1c 7.4, goal < 7.0  Not controlled  SSI  Other Stroke Risk Factors  Advanced age  Former Cigarette smoker, quit smoking 22 years ago   Morbid Obesity, Body mass index is 37.81 kg/(m^2).   Hx stroke/TIA  Coronary artery disease  Aortic stenosis  Anemia H/H  10.0 / 32.5   Other Active Problems  Hypothyroid  Osteomyelitis R foot  Hospital day # 7  ATTENDING NOTE: Patient  was seen and examined by me personally. Documentation accurately reflects findings. The laboratory and radiographic studies reviewed by me.  ROS completed by me personally and pertinent positives could not be documented due to LOC  Assessment and plan completed by me personally:  Continue to follow dilantin levels with increased maintenance  RN reports cardiac dysrthymia  During HD.  Risk of AC remains high.  Would re-evaluate early this week.  SIGNED BY: Dr. Elissa Hefty      To contact Stroke Continuity provider, please refer to [email or web address] After hours, contact General Neurology

## 2015-08-18 NOTE — Progress Notes (Signed)
PULMONARY / CRITICAL CARE MEDICINE   Name: Melissa Becker MRN: [id number] DOB: [birth year]    ADMISSION DATE:  09/01/2015  REFERRING MD:  Anselmo Pickler, D.O. [facility])  Brief:  Admitted on 12/4 after a cardiac arrest and seizure, found to have an ischemic stroke.  Remains intubated.  SUBJECTIVE:  HD yesterday Moving legs today> change from the last few days    VITAL SIGNS: BP 120/50 mmHg  Pulse 74  Temp(Src) 100.8 F (38.2 C) (Oral)  Resp 21  Ht 5\' 6"  (1.676 m)  Wt 106.2 kg (234 lb 2.1 oz)  BMI 37.81 kg/m2  SpO2 99%  HEMODYNAMICS:    VENTILATOR SETTINGS: Vent Mode:  [-] PRVC FiO2 (%):  [40 %] 40 % Set Rate:  [16 bmp] 16 bmp Vt Set:  [550 mL] 550 mL PEEP:  [5 cmH20] 5 cmH20 Pressure Support:  [10 cmH20] 10 cmH20 Plateau Pressure:  [17 cmH20-22 cmH20] 22 cmH20  INTAKE / OUTPUT: I/O last 3 completed shifts: In: 2185 [I.V.:380; NG/GT:1805] Out: [id number] [Urine:1020; Stool:250]  PHYSICAL EXAMINATION:  Gen: chronically ill appearing on vent HENT: OP clear, ETT in place PULM: CTA B, vent supported breaths CV: Irreg irreg rhythm, normal rate, systolic murmur GI: BS+, soft, nontender Derm: no cyanosis or rash Neuro: raises left shoulder to pain, moving legs to pain today   LABS; CBC Recent Labs     08/16/15  0250  08/17/15  0415  08/18/15  1024  WBC  10.4  11.7*  10.3  HGB  9.8*  10.0*  8.9*  HCT  32.4*  32.5*  30.7*  PLT  224  251  222    Coag's No results for input(s): APTT, INR in the last 72 hours.  BMET Recent Labs     08/16/15  0250  08/17/15  0415  08/18/15  0500  NA  142  143  141  K  3.7  4.0  3.7  CL  103  98*  99*  CO2  26  30  28   BUN  120*  92*  91*  CREATININE  7.21*  5.21*  4.65*  GLUCOSE  314*  306*  247*    Electrolytes Recent Labs     08/16/15  0250  08/17/15  0415  08/18/15  0500  CALCIUM  8.3*  8.4*  8.5*  PHOS   --   6.7*  6.1*    Sepsis Markers No results for input(s): PROCALCITON, O2SATVEN in the last  72 hours.  Invalid input(s): LACTICACIDVEN  ABG No results for input(s): PHART, PCO2ART, PO2ART in the last 72 hours.  Liver Enzymes Recent Labs     08/17/15  0415  08/18/15  0500  ALBUMIN  2.3*  2.2*    Cardiac Enzymes No results for input(s): TROPONINI, PROBNP in the last 72 hours.  Glucose Recent Labs     08/17/15  1216  08/17/15  1643  08/17/15  1949  08/17/15  2331  08/18/15  0334  08/18/15  0827  GLUCAP  282*  162*  170*  242*  205*  224*    Imaging Dg Chest Port 1 View  08/18/2015  CLINICAL DATA:  Ventilator dependent respiratory failure. Followup atelectasis. EXAM: PORTABLE CHEST 1 VIEW COMPARISON:  08/17/2015 and earlier. FINDINGS: Endotracheal tube tip below the thoracic inlet projecting approximately 8 cm above the carina. Right jugular dual-lumen central venous catheter tip projects over the upper SVC, unchanged. Nasogastric tube courses below the diaphragm into the stomach. Cardiac silhouette moderately  enlarged. Pulmonary vascularity normal. Improved aeration in the lung bases since yesterday, with mild right basilar atelectasis persisting. No new pulmonary parenchymal abnormalities. IMPRESSION: 1. Support apparatus satisfactory. 2. Improved aeration in the lung bases since yesterday, with only mild atelectasis persisting at the right base. 3.  No acute cardiopulmonary disease otherwise. Electronically Signed   By: Evangeline Dakin M.D.   On: 08/18/2015 07:35   Dg Chest Port 1 View  08/17/2015  CLINICAL DATA:  Hypoxia EXAM: PORTABLE CHEST 1 VIEW COMPARISON:  August 16, 2015 FINDINGS: Endotracheal tube tip is 4.7 cm above the carina. Central catheter tip is in the superior vena cava. Nasogastric tube tip and side port are below the diaphragm. No pneumothorax. There is atelectatic change in the left base with minimal left effusion. Lungs elsewhere clear. Heart is borderline prominent with pulmonary vascularity within normal limits. No adenopathy. No bone lesions.  IMPRESSION: Tube and catheter positions as described without pneumothorax. Left base atelectasis with small left effusion. Stable slight cardiac prominence. Electronically Signed   By: Lowella Grip III M.D.   On: 08/17/2015 07:43   Dg Chest Port 1 View  08/16/2015  CLINICAL DATA:  Encounter for central line EXAM: PORTABLE CHEST 1 VIEW COMPARISON:  Earlier today FINDINGS: Endotracheal tube tip is just above the clavicular heads, positioning similar to prior. Nasogastric tube tip at the stomach. New right IJ dialysis catheter, tip at the upper SVC. No pneumothorax or new mediastinal widening. Haziness of the lower chest, greater on the left where there is probable layering pleural effusion. IMPRESSION: 1. New right IJ line without adverse finding. 2. Stable aeration compared to this morning. Electronically Signed   By: Monte Fantasia M.D.   On: 08/16/2015 13:10       STUDIES:  12/03 CT head [facility]) >> atrophy 12/04 CT head >> acute Rt parietal occipital infarct, Rt cerebellar infarct 12/04 U/S Abd >> unremarkable appearance of kidneys 12/05 MRI/MRA brain >> numerous b/l infarcts concerning for embolic infarcts [id number] Echo >> EF 50%, diffuse hypokinesis, grade 1 diastolic dysfx, mod AS, mod LA dilation 12/05 EEG >> generalized slowing 12/05 MRI lumbar spine >> L2-3, L3-4 spinal stenosis, degenerative arthropathy L3-4, L4-5 12/8 MRI brain >> no change in acute infarcts  12/8 LP > only 4 WBC, 30 RBC, glucose 162, Protein normal 12/8 EEG > generalized slowing, occassional broad spike  CULTURES: 12/03 Blood ([facility]) >> 12/03 Urine ([facility]) >> 12/04 Blood >> 12/04 Respiratory viral panel >> negative 12/04 Pneumococcal Ag >> negative 12/05 CSF >> Negative 12/06 Legionella Ag >> negative 12/8 CSF >> NGTD 12/8 CSF HSV PCR > negative  ANTIBIOTICS: 12/04 Tressie Ellis >> 12/8 12/04 Ampicillin >> 12/07 12/04 Vancomycin >>  12/8 12/06 Diflucan >>12/8 12/10 Doxycycline >>   SIGNIFICANT  EVENTS: 12/03 Admit to OSH w/ Seizure & Fall 12/04 Transfer to [facility]; weaned off pressors; neurology consulted 12/05 Difficulty with LP due to spinal stenosis >> only cultures sent; add HCO3 to IV fluid 12/06 Nephrology consulted; d/c HCO3 from IV fluid 12/9 Start HD  LINES/TUBES: 12/03 ETT ([facility]) >> 12/9 R IJ HD cath >   DISCUSSION: 69 yo female presented to [facility] with 2 weeks of HA, and had developed fever up to 102F.  She had fall at home with Rtward gaze and Lt sided weakness.  She is reported to have seizure at [facility].  There is also report of ventricular fibrillation at [facility] >> ?if this was ECG artifact in setting of seizure.  She has hx of DM,  HTN, TIA, Hypothyroidism, Neuropathy, Aortic stenosis, CAD, GERD, A fib, Rt foot osteomyelitis.  12/10 > still no improvement after one session of HD  ASSESSMENT / PLAN:  NEUROLOGIC A:   Acute encephalopathy with status epilepticus likely from acute embolic infarcts, it remains unclear how much of encephalopathy is due to stroke vs anoxic injury vs uremia 12/11 > now moving legs more  Hx of DM neuropathy  P:   RASS goal 0 AED's per neurology  Hold outpt cymbalta Given improvement in exam 12/11 (albiet small), would favor further attempts at HD.  Would recommend neurology and HD discuss on 12/12 and formulate a plan for more HD  PULMONARY A: Acute hypoxic respiratory failure and concern for pneumonia > improved but mental status limits extubation Interstitial edema with pleural effusions > improved P:   PSV as tolerated  Advance ETT 12/11 F/u CXR  CARDIOVASCULAR A:  Hx of A fib >> not on anticoagulation as outpt Hx of HTN, CAD, HLD, mod aortic stenosis VF arrest at outside hospital >> happened with seizure, might have been ECG artifact in setting of seizure Elevated troponin >> demand ischemia Intermittent bradycardia Hypertension P:  Tele Continue metoprolol Add scheduled hydralazine Defer cardiology  assessment for now until mental status improves Continue norvasc, lipitor, ASA Hold outpt cozaar Plan to start anticoagulation around day 7 after stroke > defer to neurology  RENAL A:   AKI with oliguria >> baseline creatinine 0.8 from August 123456, cause uncertain? ?Uremia (see discussion above) P:   Replace electrolytes as needed HD per renal Appreciate renal input  GASTROINTESTINAL A:   Nutrition P:   Tube feeds Protonix for SUP  HEMATOLOGIC A:   Anemia of critical illness and chronic disease P:  F/u CBC SCD's, SQ heparin for DVT prevention Monitor for bleeding  INFECTIOUS A:   Septic shock >> likely from pneumonia with possible viral prodrome> resolved Doubt meningitis/encephalitis Chronic Rt foot osteomyelitis Yeast UTI P:   Monitor off of antibiotics Restart home doxycycline for osteomyelitis  ENDOCRINE A:   Hx of DM, hypothyroidism Worsening hyperglycemia P:   SSI > change to resistant scale Hold outpt glimepiride, metformin Continue synthroid Increase glargine 12/11  Husband updated bedside by me 12/9, 12/10, 12/11  CC time 35 minutes  Roselie Awkward, MD [place] [facility] Pager: [phone] Cell: [phone] After 3pm or if no response, call [phone]

## 2015-08-18 NOTE — Progress Notes (Addendum)
[facility] Rounding Note  Subjective:  Events of last 24: Dialysis #2 yesterday for azotemia as neuro attributing AMS to uremia (her presentation is not typical for uremic encephalopathy...) Had 3.5 hour treatment yesterday - not much change in numbers however Pt has not waked up and shows no appreciable neurologic change (sedation has been off since 12/6) Still making some urine Remains hemodynamically stable  Objective:    Vital signs in last 24 hours: Filed Vitals:   08/18/15 0336 08/18/15 0400 08/18/15 0500 08/18/15 0600  BP:  169/71 120/52 162/50  Pulse:  80 75 78  Temp: 98.4 F (36.9 C)     TempSrc: Oral     Resp:  19 19 21   Height:      Weight:   106.2 kg (234 lb 2.1 oz)   SpO2:  98% 98% 99%   Weight change: -5.3 kg (-11 lb 11 oz)  Intake/Output Summary (Last 24 hours) at 08/18/15 0825 Last data filed at 08/18/15 0600  Gross per 24 hour  Intake   1365 ml  Output    745 ml  Net    620 ml    Physical Exam:  Blood pressure 162/50, pulse 78, temperature 98.4 F (36.9 C), temperature source Oral, resp. rate 21, height 5\' 6"  (1.676 m), weight 106.2 kg (234 lb 2.1 oz), SpO2 99 %. Gen: Intubated, pale WF - unresponsive - eyes open - very agitated - does not follow any commands or track Neck: Cannot see neck veins Chest: Lungs anteriorly clear Heart: Distant heart sounds.  Irreg S1S2 No S3 1/7 murmur USB Brady today in the [id number] No diastolic murmur Abdomen: soft, obese, no masses felt + BS Ext: Pitting edema of arms much better No LE edema. Trialysis catheter R IJ (12/9)  Weight Trending 12/4 113.5 12/5 114.6 12/6 116    Diuretics started high dose 12/7 115.6 12/8 113.4 12/9 109.8  Diuretics stopped/HD vol even 12/10 111.5 HD volume even 12/11 106.2 (??)  Labs:  Recent Labs Lab 09/06/2015 2010 08/12/15 0635 08/13/15 0238 08/14/15 0335 08/15/15 0303 08/16/15 0250 08/17/15 0415 08/18/15 0500  NA 142 142 143 142 143 142 143 141  K 4.3 4.3  4.0 3.6 3.6 3.7 4.0 3.7  CL 109 111 108 105 101 103 98* 99*  CO2 20* 16* 22 22 24 26 30 28   GLUCOSE 187* 171* 185* 194* 282* 314* 306* 247*  BUN 45* 49* 60* 76* 96* 120* 92* 91*  CREATININE 3.91* 4.26* 5.44* 6.06* 6.71* 7.21* 5.21* 4.65*  CALCIUM 7.3* 7.0* 7.1* 7.1* 7.9* 8.3* 8.4* 8.5*  PHOS 7.4* 7.1* 7.6* 7.9* 9.2*  --  6.7* 6.1*     Recent Labs Lab 08/17/2015 2010 08/13/15 0238  08/15/15 0303 08/17/15 0415 08/18/15 0500  AST 51* 26  --   --   --   --   ALT 78* 52  --   --   --   --   ALKPHOS 60 54  --   --   --   --   BILITOT 0.5 0.5  --   --   --   --   PROT 6.2* 5.8*  --   --   --   --   ALBUMIN 2.7* 2.4*  < > 2.1* 2.3* 2.2*  < > = values in this interval not displayed.  Recent Labs Lab 08/21/2015 2010  LIPASE 36  AMYLASE 54     Recent Labs Lab 08/14/15 0335 08/15/15 0303 08/16/15 0250 08/17/15 [id number]  WBC 8.9 8.9 10.4 11.7*  NEUTROABS  --   --  8.9* 9.4*  HGB 9.0* 9.9* 9.8* 10.0*  HCT 30.1* 32.6* 32.4* 32.5*  MCV 88.5 87.6 88.0 89.0  PLT 177 184 224 251     Recent Labs Lab 08/21/2015 2010 08/12/15 0055 08/12/15 0635 08/13/15 0238  CKTOTAL  --   --   --  134  TROPONINI 4.62* 3.89* 2.48*  --      Recent Labs Lab 08/17/15 1216 08/17/15 1643 08/17/15 1949 08/17/15 2331 08/18/15 0334  GLUCAP 282* 162* 170* 242* 205*     Studies/Results: Dg Chest Port 1 View  08/18/2015  CLINICAL DATA:  Ventilator dependent respiratory failure. Followup atelectasis. EXAM: PORTABLE CHEST 1 VIEW COMPARISON:  08/17/2015 and earlier. FINDINGS: Endotracheal tube tip below the thoracic inlet projecting approximately 8 cm above the carina. Right jugular dual-lumen central venous catheter tip projects over the upper SVC, unchanged. Nasogastric tube courses below the diaphragm into the stomach. Cardiac silhouette moderately enlarged. Pulmonary vascularity normal. Improved aeration in the lung bases since yesterday, with mild right basilar atelectasis persisting. No new  pulmonary parenchymal abnormalities. IMPRESSION: 1. Support apparatus satisfactory. 2. Improved aeration in the lung bases since yesterday, with only mild atelectasis persisting at the right base. 3.  No acute cardiopulmonary disease otherwise. Electronically Signed   By: Evangeline Dakin M.D.   On: 08/18/2015 07:35   Dg Chest Port 1 View  08/17/2015  CLINICAL DATA:  Hypoxia EXAM: PORTABLE CHEST 1 VIEW COMPARISON:  August 16, 2015 FINDINGS: Endotracheal tube tip is 4.7 cm above the carina. Central catheter tip is in the superior vena cava. Nasogastric tube tip and side port are below the diaphragm. No pneumothorax. There is atelectatic change in the left base with minimal left effusion. Lungs elsewhere clear. Heart is borderline prominent with pulmonary vascularity within normal limits. No adenopathy. No bone lesions. IMPRESSION: Tube and catheter positions as described without pneumothorax. Left base atelectasis with small left effusion. Stable slight cardiac prominence. Electronically Signed   By: Lowella Grip III M.D.   On: 08/17/2015 07:43   Dg Chest Port 1 View  08/16/2015  CLINICAL DATA:  Encounter for central line EXAM: PORTABLE CHEST 1 VIEW COMPARISON:  Earlier today FINDINGS: Endotracheal tube tip is just above the clavicular heads, positioning similar to prior. Nasogastric tube tip at the stomach. New right IJ dialysis catheter, tip at the upper SVC. No pneumothorax or new mediastinal widening. Haziness of the lower chest, greater on the left where there is probable layering pleural effusion. IMPRESSION: 1. New right IJ line without adverse finding. 2. Stable aeration compared to this morning. Electronically Signed   By: Monte Fantasia M.D.   On: 08/16/2015 13:10   Medications . sodium chloride 250 mL (08/18/15 0600)  . propofol (DIPRIVAN) infusion Stopped (08/14/15 0828)   . amLODipine  10 mg Per Tube q morning - 10a  . antiseptic oral rinse  7 mL Mouth Rinse 10 times per day  .  aspirin  325 mg Per Tube Daily  . atorvastatin  80 mg Per Tube QPM  . chlorhexidine gluconate  15 mL Mouth Rinse BID  . doxycycline  100 mg Oral Q12H  . feeding supplement (PRO-STAT SUGAR FREE 64)  30 mL Per Tube BID  . feeding supplement (VITAL HIGH PROTEIN)  1,000 mL Per Tube Q24H  . heparin subcutaneous  5,000 Units Subcutaneous 3 times per day  . hydrALAZINE  25 mg Oral 3 times per day  .  insulin aspart  0-20 Units Subcutaneous 6 times per day  . insulin glargine  30 Units Subcutaneous Daily  . levothyroxine  100 mcg Per Tube QAC breakfast  . metoprolol tartrate  25 mg Oral BID  . pantoprazole sodium  40 mg Per Tube Daily  . phenytoin (DILANTIN) IV  100 mg Intravenous 3 times per day   Background: 69 y.o. year-old 69 yo female with background hx of DM, HTN, TIA, hypothyroidism, neuropathy, aortic stenosis, CAD, GERD, A fib, Rt foot osteomyelitis. Admitted to [facility] after presenting with HA, recent fevers to 102, fall at home, possible sz, acute encephalopathy with findings of acute bilateral embolic infarcts on imaging studies. Had VF arrest requiring defib X 3. Developed septic shock (felt 2/2 PNA vs viral syndrome) requiring pressors, VDRF. She was transferred here for further management. We are asked to see b/o AKI. Creatinine on admission to [facility] was 0.75 on 08/10/15, up to 2.7 on 12/4 day of transfer->3.9 on arrival here 12/4 and has continued to rise - up to 5.44 12/6  with minimal UOP. She was on benazepril prior to admission. Received CT [facility] at [facility] on 12/3 but I don't believe any contrast was administered. UA there showed >300 protein, large blood. US shows large kidneys (c/w DM). UPC 1.6 gm proteinuria (c/w DM) 6-30 RBC foley specimen.    Impression/Plan 1. AKI - normal renal function at baseline (0.75) , but UA at outside hospital with proteinuria so likely underlying diabetic nephropathy. ATN proximate cause for her AKI (shock/sepsis/arrest/ACE  on board prior to presentation). CK normal so no rhabdo. No obstruction on [place], kidneys large (would be c/w DM; 1.6 gm proteinuria also fits).  Cannot r/o possibility emboli to her kidneys. Despite good UOP had HD X 2 days  yesterday for "azotemia" (stroke team concerns - see #4)  No change in MS so far.  Have held diuretics as of 12/9 (weight was down 6-7 kg and I had concern that was too dry.) CXR is clear today and she has no edema. K fine. Will not HD again today, regroup tomorrow re indications. (No heparin as neuro indicates risk of hemorrhagic transformation of strokes)  2. Acute hypoxic resp failure/VDRF 3. Bilateral cerebral emboli with sz - dilantin. EEG - "diffuse cerebral dysfunction that is non-specific in etiology and can be seen with hypoxic/ischemic injury, toxic/metabolic encephalopathies, or medication effect. Broad sharp waves over the right frontotemporal and right frontal regions indicate possible epileptogenic potential but no electrographic seizures". 4. Encephalopathy - LP neg. Bilateral strokes. Cannot exclude superimposed anoxic brain injury from arrest.  Off sedation since 12/6.  Although stroke team  attributing issues to "uremia" this is not typical for uremic encephalopathy and it should be noted that her MS has been as is well before she became azotemic (on admission BUN 40's)   She has had HD X 2. No appreciable change.  5. Sepsis - fortaz/vanco/diflucan all stopped. Resolved septic shock 6. HTN - meds per CCM 7. DM2 8. AF   Jamal Maes, MD [facility] [phone] Pager 08/18/2015, 8:25 AM

## 2015-08-19 ENCOUNTER — Inpatient Hospital Stay (HOSPITAL_COMMUNITY): Payer: Medicare Other

## 2015-08-19 LAB — GLUCOSE, CAPILLARY
GLUCOSE-CAPILLARY: 185 mg/dL — AB (ref 65–99)
GLUCOSE-CAPILLARY: 250 mg/dL — AB (ref 65–99)
Glucose-Capillary: 224 mg/dL — ABNORMAL HIGH (ref 65–99)
Glucose-Capillary: 241 mg/dL — ABNORMAL HIGH (ref 65–99)
Glucose-Capillary: 253 mg/dL — ABNORMAL HIGH (ref 65–99)
Glucose-Capillary: 195 mg/dL — ABNORMAL HIGH (ref 65–99)

## 2015-08-19 LAB — CBC WITH DIFFERENTIAL/PLATELET
BASOS PCT: 0 %
Basophils Absolute: 0 10*3/uL (ref 0.0–0.1)
EOS ABS: 0.1 10*3/uL (ref 0.0–0.7)
Eosinophils Relative: 1 %
HEMATOCRIT: 31.3 % — AB (ref 36.0–46.0)
HEMOGLOBIN: 9.3 g/dL — AB (ref 12.0–15.0)
LYMPHS ABS: 0.8 10*3/uL (ref 0.7–4.0)
Lymphocytes Relative: 7 %
MCH: 26.6 pg (ref 26.0–34.0)
MCHC: 29.7 g/dL — ABNORMAL LOW (ref 30.0–36.0)
MCV: 89.7 fL (ref 78.0–100.0)
MONOS PCT: 9 %
Monocytes Absolute: 1.1 10*3/uL — ABNORMAL HIGH (ref 0.1–1.0)
NEUTROS ABS: 9.4 10*3/uL — AB (ref 1.7–7.7)
Neutrophils Relative %: 83 %
Platelets: 252 10*3/uL (ref 150–400)
RBC: 3.49 MIL/uL — AB (ref 3.87–5.11)
RDW: 15.3 % (ref 11.5–15.5)
WBC: 11.4 10*3/uL — ABNORMAL HIGH (ref 4.0–10.5)

## 2015-08-19 LAB — CSF CULTURE: CULTURE: NO GROWTH

## 2015-08-19 LAB — RENAL FUNCTION PANEL
ALBUMIN: 2.4 g/dL — AB (ref 3.5–5.0)
ANION GAP: 17 — AB (ref 5–15)
BUN: 134 mg/dL — ABNORMAL HIGH (ref 6–20)
CALCIUM: 8.9 mg/dL (ref 8.9–10.3)
CO2: 25 mmol/L (ref 22–32)
CREATININE: 5.4 mg/dL — AB (ref 0.44–1.00)
Chloride: 100 mmol/L — ABNORMAL LOW (ref 101–111)
GFR calc Af Amer: 8 mL/min — ABNORMAL LOW (ref >60)
GFR calc non Af Amer: 7 mL/min — ABNORMAL LOW (ref >60)
GLUCOSE: 280 mg/dL — AB (ref 65–99)
PHOSPHORUS: 7.4 mg/dL — AB (ref 2.5–4.6)
Potassium: 4.1 mmol/L (ref 3.5–5.1)
SODIUM: 142 mmol/L (ref 135–145)

## 2015-08-19 LAB — PHENYTOIN LEVEL, TOTAL

## 2015-08-19 LAB — CSF CULTURE W GRAM STAIN

## 2015-08-19 MED ORDER — MIDAZOLAM HCL 2 MG/2ML IJ SOLN: 1.0000 mg | INTRAMUSCULAR | Status: DC | PRN

## 2015-08-19 MED ORDER — FENTANYL CITRATE (PF) 100 MCG/2ML IJ SOLN: 50.0000 ug | INTRAMUSCULAR | Status: DC | PRN

## 2015-08-19 MED ORDER — HYDRALAZINE HCL 50 MG PO TABS
50.0000 mg | ORAL_TABLET | Freq: Three times a day (TID) | ORAL | Status: DC
  Administered 2015-08-19 – 2015-08-21 (×4): 50 mg via ORAL
  Filled 2015-08-19 (×9): qty 1

## 2015-08-19 MED ORDER — HEPARIN SODIUM (PORCINE) 1000 UNIT/ML DIALYSIS
1000.0000 [IU] | INTRAMUSCULAR | Status: DC | PRN
  Filled 2015-08-19: qty 6

## 2015-08-19 NOTE — Progress Notes (Signed)
STROKE TEAM PROGRESS NOTE   SUBJECTIVE (INTERVAL HISTORY) Her RN and husband and sister are at the bedside. She has no significant change since yesterday. Still opens eyes but not following commands.    OBJECTIVE Temp:  [98.5 F (36.9 C)-100.3 F (37.9 C)] 98.5 F (36.9 C) (12/12 1524) Pulse Rate:  [30-147] 78 (12/12 1602) Cardiac Rhythm:  [-] Atrial fibrillation (12/12 1200) Resp:  [14-33] 26 (12/12 1602) BP: (80-206)/(26-125) 93/49 mmHg (12/12 1602) SpO2:  [97 %-100 %] 100 % (12/12 1602) FiO2 (%):  [40 %] 40 % (12/12 1602) Weight:  [239 lb 3.2 oz (108.5 kg)-252 lb 6.8 oz (114.5 kg)] 252 lb 6.8 oz (114.5 kg) (12/12 1400)  CBC:   Recent Labs Lab 08/18/15 1024 08/19/15 0455  WBC 10.3 11.4*  NEUTROABS 8.5* 9.4*  HGB 8.9* 9.3*  HCT 30.7* 31.3*  MCV 90.8 89.7  PLT 222 [id number]    Basic Metabolic Panel:   Recent Labs Lab 08/13/15 0238 08/14/15 0335  08/18/15 0500 08/19/15 0455  NA 143 142  < > 141 142  K 4.0 3.6  < > 3.7 4.1  CL 108 105  < > 99* 100*  CO2 22 22  < > 28 25  GLUCOSE 185* 194*  < > 247* 280*  BUN 60* 76*  < > 91* 134*  CREATININE 5.44* 6.06*  < > 4.65* 5.40*  CALCIUM 7.1* 7.1*  < > 8.5* 8.9  MG 1.5* 2.0  --   --   --   PHOS 7.6* 7.9*  < > 6.1* 7.4*  < > = values in this interval not displayed.  Lipid Panel:     Component Value Date/Time   CHOL 127 08/13/2015 0238   TRIG 191* 08/13/2015 0238   TRIG 195* 08/13/2015 0238   HDL 28* 08/13/2015 0238   CHOLHDL 4.5 08/13/2015 0238   VLDL 39 08/13/2015 0238   LDLCALC 60 08/13/2015 0238   HgbA1c:  Lab Results  Component Value Date   HGBA1C 7.4* 08/12/2015   Urine Drug Screen:     Component Value Date/Time   LABOPIA POSITIVE* 08/12/2015 1430   COCAINSCRNUR NONE DETECTED 08/12/2015 1430   LABBENZ POSITIVE* 08/12/2015 1430   AMPHETMU NONE DETECTED 08/12/2015 1430   THCU NONE DETECTED 08/12/2015 1430   LABBARB NONE DETECTED 08/12/2015 1430      IMAGING I have personally reviewed the radiological  images below and agree with the radiology interpretations.  CT HEAD 08/09/2015   Acute moderate RIGHT parietal occipital infarct (watershed versus angular branch of the MCA). Small acute RIGHT cerebellar infarct. Small area RIGHT frontal encephalomalacia may be posttraumatic or ischemic.   US Abdomen Complete 08/28/2015  1. Status post cholecystectomy. No acute abnormality seen within the abdomen. 2. Kidneys unremarkable in appearance. 3. Scattered calcific atherosclerotic disease along the abdominal aorta.     MRI and MRA HEAD 08/12/2015  Exam is motion degraded. Numerous bilateral acute/subacute nonhemorrhagic infarcts. Most confluent infarcts have an appearance of acute/subacute infarct involves portions of the right frontal -parietal - occipital and posterior right temporal lobe. Swelling of gyri without significant local mass effect. Smaller acute nonhemorrhagic infarcts scattered throughout the hemispheres bilaterally (frontal lobes, parietal lobes, occipital lobes), right temporal lobe/subinsular region and cerebellum bilaterally. The involvement of multiple vascular distributions raises possibility of embolic disease. Remote small right cerebellar infarct. Exam is slightly motion degraded. Mild irregularity and slight narrowing cavernous segment right internal carotid artery. Small bulge may be related to atherosclerotic type changes rather than aneurysm. No  significant stenosis of either carotid terminus or M1 segment of either middle cerebral artery. Middle cerebral artery branch vessel irregularity and narrowing bilaterally. Mild narrowing and irregularity A1 segment right anterior cerebral artery. Bulge of the distal A1 segment of the right anterior cerebral artery appears to be origin of a vessel rather than saccular aneurysm. Mild to moderate narrowing A2 segment right anterior cerebral artery. Left vertebral artery is dominant. Mild to moderate narrowing distal right vertebral artery. Mild to  moderate narrowing portions of the posterior inferior cerebellar artery bilaterally. Mild to moderate narrowing portions of the anterior inferior cerebellar artery bilaterally. Narrowing of the superior cerebellar artery more notable on the left. Mild to moderate narrowing portions of the proximal, mid and distal aspect of the posterior cerebral artery bilaterally. Tiny bulge superior margin P1 segments left posterior cerebral artery. Question tiny aneurysm (less than 2 mm). A vessel may rise from this region.   Mr Brain Wo Contrast  08/15/2015  IMPRESSION: Multiple areas of acute infarction are unchanged from 08/12/2015. Negative for hemorrhage. Findings suggest cerebral emboli.  14:08   CUS - Bilateral: 1-39% ICA stenosis. Vertebral artery flow is antegrade.  2D echo - Normal LV size with EF 50%, diffuse hypokinesis. Mildly dilated RV with normal systolic function. Biatrial enlargement. Moderate aortic stenosis. Dilated IVC suggests elevated RV filling pressure. Mild pulmonary hypertension.  Mr Lumbar Spine Wo Contrast  08/12/2015  IMPRESSION: No complications seen following unsuccessful lumbar puncture. Mild multifactorial spinal stenosis at L2-3 and L3-4 could have contributed to the difficulty at at lumbar puncture. Degenerative facet arthropathy at L3-4 and L4-5. Should a lumbar puncture be repeated, greater likelihood of success might be obtained at the L4-5 or L5-S1 level.  Dg Chest Port 1 View 08/16/2015  IMPRESSION: Allowing for differences in patient positioning there has been overall improvement in the appearance of the pulmonary interstitium especially on the right. There is persistent left lower lobe atelectasis or pneumonia with small left pleural effusion. Stable cardiomegaly. The support tubes are in reasonable position.   08/13/2015  IMPRESSION: 1. Persistent left lower lobe atelectasis and small left pleural effusion. Stable mild low-grade CHF. The endotracheal tube is in  reasonable position. 2. The nasogastric tube tip and proximal port lie in the region of the gastric body below the expected location of the GE junction.   EEG - This EEG is abnormal with severe generalized continuous nonspecific slowing cerebral activity. This pattern of slowing can be seen with metabolic and toxic encephalopathies, as well as with severe degenerative central nervous system disorders. No evidence of epileptiform activity was recorded.   Repeat EEG  08/16/2015 Impression: This EEG is abnormal due to the presence of: 1. Moderate diffuse slowing of the background 2. Occasional broad sharp waves over the right frontotemporal and right frontal regions   Portable chest x-ray 1 view 08/18/2015 1. Support apparatus satisfactory. 2. Improved aeration in the lung bases since yesterday, with only mild atelectasis persisting at the right base. 3.No acute cardiopulmonary disease otherwise.   Physical exam  Temp:  [98.5 F (36.9 C)-100.3 F (37.9 C)] 98.5 F (36.9 C) (12/12 1524) Pulse Rate:  [30-147] 78 (12/12 1602) Resp:  [14-33] 26 (12/12 1602) BP: (80-206)/(26-125) 93/49 mmHg (12/12 1602) SpO2:  [97 %-100 %] 100 % (12/12 1602) FiO2 (%):  [40 %] 40 % (12/12 1602) Weight:  [239 lb 3.2 oz (108.5 kg)-252 lb 6.8 oz (114.5 kg)] 252 lb 6.8 oz (114.5 kg) (12/12 1400)  General - Well nourished, well developed  Caucasian lady intubated HEENT: NCAT; PERRL; sclera slightly injected; intubated Cardiovascular - irregular rhythm and rate. Pulmonary: CTA Abdomen: soft: ND Extremities: ankle edema  Neuro Mental Status:  Opens eyes spontaneously; does not follow commands  Cranial Nerves PERRL; no blink to threat; OCRs intact; eye closure grossly symmetric.  Spontaneous cough; breathing over the vent  Motor/Sensory: Left UE with extn to noxious stimuli; right upper extremity with w/d to noxious stimuli; right LE slight w/d and left LE no response to noxious  stimuli  Coordination/Gait: Deferred due to LOC    ASSESSMENT/PLAN Ms. FAMIE HEARST is a 69 y.o. female with history of DM, HTN, CAD, atrial fibrillation, TIA, GI bleed presenting with complaints of a HA who fell at home due to weakness who developed seizures followed by v fib/cardiac arrest at an outlying hospital. She was intubated and transferred to done where she was found to have a R parietal occipital infarct and small R cerebellar infarct. She did not receive IV t-PA due to unknown time last known well.   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 Tiny L PCA aneurysm  Repeat MRI brain 08/15/15 no change  Carotid Doppler  Unremarkable   Ammonia level 22 (WNL) on 08/17/2015  2D Echo  EF 50%  LDL 60  HgbA1c 7.4  Heparin 5000 units sq tid for VTE prophylaxis Diet NPO time specified  No antithrombotic prior to admission, now on ASA 325mg  for stroke prevention. No AC at this time due to risk of hemorrhagic transformation in the setting of large infarcts at right temporal lobe. Will consider AC in 5-7 days post stroke and if no planned procedures.   Ongoing aggressive stroke risk factor management  Therapy recommendations:  pending  Disposition:  pending  Seizure  Likely secondary to cortical infarcts  EEG no seizure  Repeat EEG today on 08/16/2015 did not show seizure   Dilantin level daily 08/18/2015 - <2.5; corrected level 7 or 8  Discussed with pharmacist and Dr Belenda Cruise - increase dose to 200 mg Q12 hours.    free level in AM today pending.May need to give additional dose after dialysis if level is hard to  maintain  Uremic encephalopathy  Pt still open eyes but not tracking and not moving LEs as before  MRI no acute changes comparing with initial MRI  LP not supportive for CNS infection   Neuro change likely due to uremic encephalopathy  Ammonia level 22 (WNL) on 08/17/2015  AKI and uremia  CK 134  Cre 3.91 -> 4.26 -> 5.44-> 6.06-> 6.71->7.21-->5.2 -> 4.65  BUN 91 Sunday  Nephrology on board  On HD   ? Sepsis  procalcitonin 19.06 -> 16.71->12.60  afebrile  Temp 100.8  today (08/18/2015) CBC with diff. Pending  Portable chest x-ray today - improved (see above)  CSF cultures no growth so far  Check urine  Off antibiotics  CSF not supportive for CNS infection  Pt afebrile, WBC normolized  Vent dependent respiratory failure  Intubated  CCM managing  Likely need trach soon  Atrial Fibrillation  Home anticoagulation:  No antithrombotics at home  CHA2DS2-VASc Score = 6, ?2 oral anticoagulation recommended  [age] Years:  59-74   +  1   Sex:  Female   +1    Hypertension History: yes   +1     Diabetes Mellitus:  yes   +1   Congestive Heart Failure History:  0  Vascular Disease History:  0     Stroke/TIA/Thromboembolism History: yes   +2  On ASA for now. Hold off AC for 7 days to avoid hemorrhagic transformation and if no planned procedures.  Would reconsider on Monday.  Possibly repeat CT scan then to ensure no medical contraindication to [facility]    Hypertension  Still elevated likely due to AKI  BP goal normotensive.  Hyperlipidemia  Home meds:  lipitor 80 resumed in hospital  LDL 60, goal < 70  Continue statin at discharge  Diabetes type II  HgbA1c 7.4, goal < 7.0  Not controlled  SSI  Other Stroke Risk Factors  Advanced age  Former Cigarette smoker, quit smoking 22 years ago   Morbid Obesity, Body mass index is 40.76 kg/(m^2).   Hx stroke/TIA  Coronary artery disease  Aortic stenosis  Anemia H/H  10.0 / 32.5   Other Active  Problems  Hypothyroid  Osteomyelitis R foot  Hospital day # 8   Patient was seen and examined by me personally. Documentation accurately reflects findings. The laboratory and radiographic studies reviewed by me. I had a long discussion with the patient's sister and husband regarding her prognosis which appears quite poor. He understands this but wants to support her for a few more days and do at least one more dialysis and then make a decision. He agrees she would not want prolonged ventilatory support, tracheostomy or PEG tube This patient is critically ill and at significant risk of neurological worsening, death and care requires constant monitoring of vital signs, hemodynamics,respiratory and cardiac monitoring, extensive review of multiple databases, frequent neurological assessment, discussion with family, other specialists and medical decision making of high complexity.I have made any additions or clarifications directly to the above note.This critical care time does not reflect procedure time, or teaching time or supervisory time of PA/NP/Med Resident etc but could involve care discussion time.  I spent 30 minutes of neurocritical care time  in the care of  this patient.       SIGNED BY: Antony Contras, MD   To contact Stroke Continuity provider, please refer to [email or web address] After hours, contact General Neurology

## 2015-08-19 NOTE — Progress Notes (Signed)
Physician notified of K at 4.1.  Acid bath changed to 3K.  Will continue to monitor.

## 2015-08-19 NOTE — Progress Notes (Signed)
PULMONARY / CRITICAL CARE MEDICINE   Name: Melissa Becker MRN: [id number] DOB: 08-28-1946    ADMISSION DATE:  08/15/2015  REFERRING MD:  Anselmo Pickler, D.O. [facility])  SUBJECTIVE:  On pressure support.    VITAL SIGNS: BP 194/50 mmHg  Pulse 49  Temp(Src) 99.9 F (37.7 C) (Oral)  Resp 30  Ht 5\' 6"  (1.676 m)  Wt 239 lb 3.2 oz (108.5 kg)  BMI 38.63 kg/m2  SpO2 100%  HEMODYNAMICS:    VENTILATOR SETTINGS: Vent Mode:  [-] PSV;CPAP FiO2 (%):  [40 %] 40 % Set Rate:  [16 bmp] 16 bmp Vt Set:  [550 mL] 550 mL PEEP:  [5 cmH20] 5 cmH20 Pressure Support:  [12 cmH20] 12 cmH20 Plateau Pressure:  [17 cmH20-22 cmH20] 22 cmH20  INTAKE / OUTPUT: I/O last 3 completed shifts: In: 2065 [I.V.:360; NG/GT:1705] Out: [id number] [Urine:1390; Stool:100]  PHYSICAL EXAMINATION:  Gen: ill appearing HEENT: Pupils reactive, ETT in place PULM: no wheeze CV: Irreg irreg rhythm, normal rate, systolic murmur GI: BS+, soft, nontender Derm: no cyanosis or rash Neuro: opens eyes with stimulation, moves Rt leg   LABS; CBC Recent Labs     08/17/15  0415  08/18/15  1024  08/19/15  0455  WBC  11.7*  10.3  11.4*  HGB  10.0*  8.9*  9.3*  HCT  32.5*  30.7*  31.3*  PLT  251  222  252    Coag's No results for input(s): APTT, INR in the last 72 hours.  BMET Recent Labs     08/17/15  0415  08/18/15  0500  08/19/15  0455  NA  143  141  142  K  4.0  3.7  4.1  CL  98*  99*  100*  CO2  30  28  25   BUN  92*  91*  134*  CREATININE  5.21*  4.65*  5.40*  GLUCOSE  306*  247*  280*    Electrolytes Recent Labs     08/17/15  0415  08/18/15  0500  08/19/15  0455  CALCIUM  8.4*  8.5*  8.9  PHOS  6.7*  6.1*  7.4*    Sepsis Markers No results for input(s): PROCALCITON, O2SATVEN in the last 72 hours.  Invalid input(s): LACTICACIDVEN  ABG No results for input(s): PHART, PCO2ART, PO2ART in the last 72 hours.  Liver Enzymes Recent Labs     08/17/15  0415  08/18/15  0500  08/19/15   0455  ALBUMIN  2.3*  2.2*  2.4*    Cardiac Enzymes No results for input(s): TROPONINI, PROBNP in the last 72 hours.  Glucose Recent Labs     08/18/15  0827  08/18/15  1239  08/18/15  1643  08/18/15  1946  08/18/15  2356  08/19/15  0401  GLUCAP  224*  198*  214*  247*  250*  224*    Imaging Dg Chest Port 1 View  08/19/2015  CLINICAL DATA:  Acute respiratory failure . EXAM: PORTABLE CHEST 1 VIEW FINDINGS: Endotracheal tube, NG tube, right IJ line in stable position. Mediastinum and hilar structures normal. Low lung volumes with mild bibasilar atelectasis and/or infiltrates again noted. Small left pleural effusion. No pneumothorax. IMPRESSION: 1. Lines and tubes in stable position. 2. Stable cardiomegaly. 3. Persistent mild bibasilar atelectasis and/or infiltrates. Small left pleural effusion . Electronically Signed   By: Marcello Moores  Register   On: 08/19/2015 07:20   Dg Chest Port 1 View  08/18/2015  CLINICAL DATA:  Ventilator dependent respiratory failure. Followup atelectasis. EXAM: PORTABLE CHEST 1 VIEW COMPARISON:  08/17/2015 and earlier. FINDINGS: Endotracheal tube tip below the thoracic inlet projecting approximately 8 cm above the carina. Right jugular dual-lumen central venous catheter tip projects over the upper SVC, unchanged. Nasogastric tube courses below the diaphragm into the stomach. Cardiac silhouette moderately enlarged. Pulmonary vascularity normal. Improved aeration in the lung bases since yesterday, with mild right basilar atelectasis persisting. No new pulmonary parenchymal abnormalities. IMPRESSION: 1. Support apparatus satisfactory. 2. Improved aeration in the lung bases since yesterday, with only mild atelectasis persisting at the right base. 3.  No acute cardiopulmonary disease otherwise. Electronically Signed   By: Evangeline Dakin M.D.   On: 08/18/2015 07:35    STUDIES:  12/03 CT head [facility]) >> atrophy 12/04 CT head >> acute Rt parietal occipital infarct, Rt  cerebellar infarct 12/04 U/S Abd >> unremarkable appearance of kidneys 12/05 MRI/MRA brain >> numerous b/l infarcts concerning for embolic infarcts [id number] Echo >> EF 50%, diffuse hypokinesis, grade 1 diastolic dysfx, mod AS, mod LA dilation 12/05 EEG >> generalized slowing 12/05 MRI lumbar spine >> L2-3, L3-4 spinal stenosis, degenerative arthropathy L3-4, L4-5 12/8 MRI brain >> no change in acute infarcts  12/8 LP > only 4 WBC, 30 RBC, glucose 162, Protein normal 12/8 EEG > generalized slowing, occassional broad spike  CULTURES: 12/04 Blood >> negative 12/04 Respiratory viral panel >> negative 12/04 Pneumococcal Ag >> negative 12/05 CSF >> Negative 12/06 Legionella Ag >> negative 12/8 CSF >>  12/8 CSF HSV PCR > negative  ANTIBIOTICS: 12/04 Tressie Ellis >> 12/8 12/04 Ampicillin >> 12/07 12/04 Vancomycin >>  12/8 12/06 Diflucan >>12/8 12/10 Doxycycline >>   SIGNIFICANT EVENTS: 12/03 Admit to OSH w/ Seizure & Fall 12/04 Transfer to [facility]; weaned off pressors; neurology consulted 12/05 Difficulty with LP due to spinal stenosis >> only cultures sent; add HCO3 to IV fluid 12/06 Nephrology consulted; d/c HCO3 from IV fluid 12/9 Start HD  LINES/TUBES: 12/03 ETT ([place]) >> 12/9 R IJ HD cath >   DISCUSSION: 69 yo female presented to [facility] with 2 weeks of HA, and had developed fever up to 102F.  She had fall at home with Rtward gaze and Lt sided weakness.  She is reported to have seizure at [facility].  There is also report of ventricular fibrillation at [facility] >> ?if this was ECG artifact in setting of seizure.  She has hx of DM, HTN, TIA, Hypothyroidism, Neuropathy, Aortic stenosis, CAD, GERD, A fib, Rt foot osteomyelitis.  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 Hx of DM neuropathy  P:   RASS goal 0 AED's per neurology  Hold outpt cymbalta Re-assess mental status after further  HD  PULMONARY A: Acute hypoxic respiratory failure and concern for pneumonia > improved but mental status limits extubation. Interstitial edema with pleural effusions > improved P:   PSV as tolerated  F/u CXR  CARDIOVASCULAR A:  Hx of A fib >> not on anticoagulation as outpt. Hx of HTN, CAD, HLD, mod aortic stenosis. VF arrest at outside hospital >> happened with seizure, might have been ECG artifact in setting of seizure. Elevated troponin >> demand ischemia. Intermittent bradycardia. Hypertension. P:  Tele Continue metoprolol, hydralazine, norvasc, lipitor, ASA Defer cardiology assessment until mental status improved Hold outpt cozaar Plan to start anticoagulation around day 7 after stroke (08/19/15) > defer to neurology  RENAL A:   AKI with oliguria >>  baseline creatinine 0.8 from August 2014. P:   HD per renal  GASTROINTESTINAL A:   Nutrition. P:   Tube feeds Protonix for SUP  HEMATOLOGIC A:   Anemia of critical illness and chronic disease. P:  F/u CBC SCD's, SQ heparin for DVT prevention Monitor for bleeding  INFECTIOUS A:   Septic shock >> likely from pneumonia with possible viral prodrome> resolved. Chronic Rt foot osteomyelitis. Yeast UTI. P:   Continue home doxycycline for osteomyelitis  ENDOCRINE A:   Hx of DM, hypothyroidism Worsening hyperglycemia P:   SSI with lantus Hold outpt glimepiride, metformin Continue synthroid  Goals of Care >> Need to assess whether mental status improves with further HD.  If no significant improvement, then concerned mental status related more to CVA.  CC time 32 minutes.  Chesley Mires, MD [facility] Pulmonary/Critical Care 08/19/2015, 9:00 AM Pager:  [phone] After 3pm call: [phone]

## 2015-08-19 NOTE — Progress Notes (Addendum)
Arrived to patient room 70M - 03 at 1400.  Reviewed treatment plan and this RN agrees with plan.  Report received from bedside RN, Elsie Amis.  Consent verified.  Patient not responsive, ventilated.   Lung sounds coarse to ausculation in all fields. Generalized edema. Cardiac:  Bradycardic at times, A-fib.  Removed caps and cleansed RIJ catheter with chlorhedxidine.  Aspirated ports of heparin and flushed them with saline per protocol.  Connected and secured lines, initiated treatment at 1425.  UF Goal of 589mL and net fluid removal 0 mL.  Lines reversed at start of treatment due to increased arterial pressures. Will continue to monitor.

## 2015-08-19 NOTE — Progress Notes (Signed)
Patient ID: Melissa Becker, female   DOB: 1946/08/14, 69 y.o.   MRN: [id number] S:Intubated and unresponsive despite 2 HD sessions and now with new paraplegia O:BP 194/50 mmHg  Pulse 49  Temp(Src) 99.4 F (37.4 C) (Oral)  Resp 30  Ht 5\' 6"  (1.676 m)  Wt 108.5 kg (239 lb 3.2 oz)  BMI 38.63 kg/m2  SpO2 100%  Intake/Output Summary (Last 24 hours) at 08/19/15 0906 Last data filed at 08/19/15 0700  Gross per 24 hour  Intake   1200 ml  Output   1010 ml  Net    190 ml   Intake/Output: I/O last 3 completed shifts: In: 2065 [I.V.:360; NG/GT:1705] Out: [id number] [Urine:1390; Stool:100]  Intake/Output this shift:    Weight change: 2.3 kg (5 lb 1.1 oz) Gen:WD WN intubated and unresponsive CVS:no rub Resp:occ rhonchi [id number] upper ext edema,  Multiple ecchymoses on lower extremities.   Recent Labs Lab 08/13/15 0238 08/14/15 0335 08/15/15 0303 08/16/15 0250 08/17/15 0415 08/18/15 0500 08/19/15 0455  NA 143 142 143 142 143 141 142  K 4.0 3.6 3.6 3.7 4.0 3.7 4.1  CL 108 105 101 103 98* 99* 100*  CO2 22 22 24 26 30 28 25   GLUCOSE 185* 194* 282* 314* 306* 247* 280*  BUN 60* 76* 96* 120* 92* 91* 134*  CREATININE 5.44* 6.06* 6.71* 7.21* 5.21* 4.65* 5.40*  ALBUMIN 2.4* 2.1* 2.1*  --  2.3* 2.2* 2.4*  CALCIUM 7.1* 7.1* 7.9* 8.3* 8.4* 8.5* 8.9  PHOS 7.6* 7.9* 9.2*  --  6.7* 6.1* 7.4*  AST 26  --   --   --   --   --   --   ALT 52  --   --   --   --   --   --    Liver Function Tests:  Recent Labs Lab 08/13/15 0238  08/17/15 0415 08/18/15 0500 08/19/15 0455  AST 26  --   --   --   --   ALT 52  --   --   --   --   ALKPHOS 54  --   --   --   --   BILITOT 0.5  --   --   --   --   PROT 5.8*  --   --   --   --   ALBUMIN 2.4*  < > 2.3* 2.2* 2.4*  < > = values in this interval not displayed. No results for input(s): LIPASE, AMYLASE in the last 168 hours.  Recent Labs Lab 08/17/15 1210  AMMONIA 22   CBC:  Recent Labs Lab 08/15/15 0303  08/16/15 0250 08/17/15 0415  08/18/15 1024 08/19/15 0455  WBC 8.9  --  10.4 11.7* 10.3 11.4*  NEUTROABS  --   < > 8.9* 9.4* 8.5* 9.4*  HGB 9.9*  --  9.8* 10.0* 8.9* 9.3*  HCT 32.6*  --  32.4* 32.5* 30.7* 31.3*  MCV 87.6  --  88.0 89.0 90.8 89.7  PLT 184  --  224 251 222 252  < > = values in this interval not displayed. Cardiac Enzymes:  Recent Labs Lab 08/13/15 0238  CKTOTAL 134   CBG:  Recent Labs Lab 08/18/15 1239 08/18/15 1643 08/18/15 1946 08/18/15 2356 08/19/15 0401  GLUCAP 198* 214* 247* 250* 224*    Iron Studies: No results for input(s): IRON, TIBC, TRANSFERRIN, FERRITIN in the last 72 hours. Studies/Results: Dg Chest Port 1 View  08/19/2015  CLINICAL DATA:  Acute respiratory  failure . EXAM: PORTABLE CHEST 1 VIEW FINDINGS: Endotracheal tube, NG tube, right IJ line in stable position. Mediastinum and hilar structures normal. Low lung volumes with mild bibasilar atelectasis and/or infiltrates again noted. Small left pleural effusion. No pneumothorax. IMPRESSION: 1. Lines and tubes in stable position. 2. Stable cardiomegaly. 3. Persistent mild bibasilar atelectasis and/or infiltrates. Small left pleural effusion . Electronically Signed   By: Marcello Moores  Register   On: 08/19/2015 07:20   Dg Chest Port 1 View  08/18/2015  CLINICAL DATA:  Ventilator dependent respiratory failure. Followup atelectasis. EXAM: PORTABLE CHEST 1 VIEW COMPARISON:  08/17/2015 and earlier. FINDINGS: Endotracheal tube tip below the thoracic inlet projecting approximately 8 cm above the carina. Right jugular dual-lumen central venous catheter tip projects over the upper SVC, unchanged. Nasogastric tube courses below the diaphragm into the stomach. Cardiac silhouette moderately enlarged. Pulmonary vascularity normal. Improved aeration in the lung bases since yesterday, with mild right basilar atelectasis persisting. No new pulmonary parenchymal abnormalities. IMPRESSION: 1. Support apparatus satisfactory. 2. Improved aeration in the lung  bases since yesterday, with only mild atelectasis persisting at the right base. 3.  No acute cardiopulmonary disease otherwise. Electronically Signed   By: Evangeline Dakin M.D.   On: 08/18/2015 07:35   . amLODipine  10 mg Per Tube q morning - 10a  . antiseptic oral rinse  7 mL Mouth Rinse 10 times per day  . aspirin  325 mg Per Tube Daily  . atorvastatin  80 mg Per Tube QPM  . chlorhexidine gluconate  15 mL Mouth Rinse BID  . doxycycline  100 mg Oral Q12H  . feeding supplement (PRO-STAT SUGAR FREE 64)  30 mL Per Tube BID  . feeding supplement (VITAL HIGH PROTEIN)  1,000 mL Per Tube Q24H  . heparin subcutaneous  5,000 Units Subcutaneous 3 times per day  . hydrALAZINE  50 mg Oral 3 times per day  . insulin aspart  0-20 Units Subcutaneous 6 times per day  . insulin glargine  45 Units Subcutaneous Daily  . levothyroxine  100 mcg Per Tube QAC breakfast  . metoprolol tartrate  25 mg Oral BID  . pantoprazole sodium  40 mg Per Tube Daily  . phenytoin (DILANTIN) IV  200 mg Intravenous Q12H    BMET    Component Value Date/Time   NA 142 08/19/2015 0455   K 4.1 08/19/2015 0455   CL 100* 08/19/2015 0455   CO2 25 08/19/2015 0455   GLUCOSE 280* 08/19/2015 0455   BUN 134* 08/19/2015 0455   CREATININE 5.40* 08/19/2015 0455   CALCIUM 8.9 08/19/2015 0455   GFRNONAA 7* 08/19/2015 0455   GFRAA 8* 08/19/2015 0455   CBC    Component Value Date/Time   WBC 11.4* 08/19/2015 0455   RBC 3.49* 08/19/2015 0455   HGB 9.3* 08/19/2015 0455   HCT 31.3* 08/19/2015 0455   PLT 252 08/19/2015 0455   MCV 89.7 08/19/2015 0455   MCH 26.6 08/19/2015 0455   MCHC 29.7* 08/19/2015 0455   RDW 15.3 08/19/2015 0455   LYMPHSABS 0.8 08/19/2015 0455   MONOABS 1.1* 08/19/2015 0455   EOSABS 0.1 08/19/2015 0455   BASOSABS 0.0 08/19/2015 0455   Background per Dr. Sanda Klein progress note from 08/18/15 70 y.o. year-old 69 yo female with background hx of DM, HTN, TIA, hypothyroidism, neuropathy, aortic stenosis, CAD,  GERD, A fib, Rt foot osteomyelitis. Admitted to [facility] after presenting with HA, recent fevers to 102, fall at home, possible sz, acute encephalopathy with findings of  acute bilateral embolic infarcts on imaging studies. Had VF arrest requiring defib X 3. Developed septic shock (felt 2/2 PNA vs viral syndrome) requiring pressors, VDRF. She was transferred here for further management. We are asked to see b/o AKI. Creatinine on admission to [facility] was 0.75 on 08/10/15, up to 2.7 on 12/4 day of transfer->3.9 on arrival here 12/4 and has continued to rise - up to 5.44 12/6 with minimal UOP. She was on benazepril prior to admission. Received CT scans at [facility] on 12/3 but I don't believe any contrast was administered. UA there showed >300 protein, large blood. US shows large kidneys (c/w DM). UPC 1.6 gm proteinuria (c/w DM) 6-30 RBC foley specimen.    Assessment/Plan:  1. AKI- h/o DM, HTN, proteinuria, non-oliguric but with worsening azotemia.  Presumably due to ischemic ATN in setting of shock/sepsis/cardiac arrest/in setting of ace-inhibition.  S/p 2 HD sessions and reassess need for ongoing dialysis.  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  Azotemia worse today and likely due to high protein tube feeds.  Will plan for one more session of HD, however her neurologic findings are not consistent with uremic encephalopathy (paraplegia in setting of embolic strokes is more likely than uremia as cause).  If no significant improvement after 3rd session, would recommend palliative care consult to help set goals/limits of care.   She is not a dialysis candidate in her current state and again her AMS preceded her ARF and has new embolic strokes which are more likely the etiology. 2. VDRF- per PCCM 3. Bilateral cerebral emboli with seizures.  Neuro following.   4. Encephalopathy- LP negative, bilateral strokes, possible  anoxic injury from arrest.  No sedation since 08/13/15.  NOT related to uremia as her AMS predated ARF.   5. Sepsis- fortaz/vanco/diflucan stopped.  Resolved SIRS. 6. HTN- per PCCM 7. DM 8. A fib     Marlaina Coburn A

## 2015-08-19 NOTE — Progress Notes (Signed)
Utilization review completed.  

## 2015-08-19 NOTE — Progress Notes (Signed)
Dialysis treatment completed.  500 mL ultrafiltrated and net fluid removal 0 mL.    Patient status unchanged. Lung sounds coarse to ausculation in all fields. Generalized edema. Cardiac: Irregular R&R, afib/aflutter with RVR up to 200 bpm.  Catheter cleansed per policy and heparin locked.   Report given to bedside RN, Elsie Amis.

## 2015-08-19 NOTE — Progress Notes (Signed)
Results for CENEDRA, SPERLE (MRN [id number]) as of 08/19/2015 13:51  Ref. Range 08/18/2015 19:46 08/18/2015 23:56 08/19/2015 04:01 08/19/2015 08:18 08/19/2015 11:28  Glucose-Capillary Latest Ref Range: 65-99 mg/dL 247 (H) 250 (H) 224 (H) 241 (H) 253 (H)  Noted that CBGs continue to be greater than 180 mg/dl. Recommend adding Novolog 3 units every 4 hours as tube feed coverage. Hold if tube feedings are held for any reason.Continue Lantus 45 units daily and Novolog correction scale every 4 hours. Will continue to follow while in hospital. Harvel Ricks RN BSN CDE

## 2015-08-20 ENCOUNTER — Inpatient Hospital Stay (HOSPITAL_COMMUNITY): Payer: Medicare Other

## 2015-08-20 LAB — GLUCOSE, CAPILLARY
GLUCOSE-CAPILLARY: 235 mg/dL — AB (ref 65–99)
GLUCOSE-CAPILLARY: 198 mg/dL — AB (ref 65–99)
Glucose-Capillary: 156 mg/dL — ABNORMAL HIGH (ref 65–99)
Glucose-Capillary: 198 mg/dL — ABNORMAL HIGH (ref 65–99)
Glucose-Capillary: 200 mg/dL — ABNORMAL HIGH (ref 65–99)
Glucose-Capillary: 220 mg/dL — ABNORMAL HIGH (ref 65–99)

## 2015-08-20 LAB — CBC
HEMATOCRIT: 27.9 % — AB (ref 36.0–46.0)
Hemoglobin: 8.3 g/dL — ABNORMAL LOW (ref 12.0–15.0)
MCH: 26.8 pg (ref 26.0–34.0)
MCHC: 29.7 g/dL — ABNORMAL LOW (ref 30.0–36.0)
MCV: 90 fL (ref 78.0–100.0)
PLATELETS: 237 10*3/uL (ref 150–400)
RBC: 3.1 MIL/uL — AB (ref 3.87–5.11)
RDW: 15.5 % (ref 11.5–15.5)
WBC: 11.2 10*3/uL — ABNORMAL HIGH (ref 4.0–10.5)

## 2015-08-20 LAB — RENAL FUNCTION PANEL
Albumin: 2.2 g/dL — ABNORMAL LOW (ref 3.5–5.0)
Anion gap: 13 (ref 5–15)
BUN: 80 mg/dL — AB (ref 6–20)
CHLORIDE: 98 mmol/L — AB (ref 101–111)
CO2: 28 mmol/L (ref 22–32)
CREATININE: 3.62 mg/dL — AB (ref 0.44–1.00)
Calcium: 8.5 mg/dL — ABNORMAL LOW (ref 8.9–10.3)
GFR calc Af Amer: 14 mL/min — ABNORMAL LOW (ref >60)
GFR calc non Af Amer: 12 mL/min — ABNORMAL LOW (ref >60)
Glucose, Bld: 215 mg/dL — ABNORMAL HIGH (ref 65–99)
Phosphorus: 6.3 mg/dL — ABNORMAL HIGH (ref 2.5–4.6)
Potassium: 4 mmol/L (ref 3.5–5.1)
Sodium: 139 mmol/L (ref 135–145)

## 2015-08-20 LAB — PHENYTOIN LEVEL, FREE AND TOTAL
Phenytoin, Free: NOT DETECTED ug/mL (ref 1.0–2.0)
Phenytoin, Total: 1.2 ug/mL — ABNORMAL LOW (ref 10.0–20.0)

## 2015-08-20 MED ORDER — INSULIN GLARGINE 100 UNIT/ML ~~LOC~~ SOLN
55.0000 [IU] | Freq: Every day | SUBCUTANEOUS | Status: DC
  Administered 2015-08-21: 55 [IU] via SUBCUTANEOUS
  Filled 2015-08-20: qty 0.55

## 2015-08-20 NOTE — Progress Notes (Signed)
ETT advanced 3cm per MD order.  Currently placed at 26 at the lip.  Will continue to monitor.

## 2015-08-20 NOTE — Progress Notes (Signed)
PULMONARY / CRITICAL CARE MEDICINE   Name: Melissa Becker MRN: [id number] DOB: 07/01/1946    ADMISSION DATE:  08/10/2015  REFERRING MD:  Anselmo Pickler, D.O. [facility])  HISTORY OF PRESENT ILLNESS:  69 year old Caucasian female with known history of diabetes mellitus as well as coronary artery disease. Presented to outside hospital on 12/3 with a two-week history of a "headache" per her husband today. Patient is currently intubated and history obtained from the patient's family as well as the electronic medical record. Patient was taking Advil for her chronic and severe headache without relief. At approximately 1 AM on 12/3 she had an unwitnessed fall in their bathroom and was found conscious by her husband. He reports she did have a rightward gaze at the time with questionable left-sided weakness. EMS assisted her in returning to her bed. The patient attempted to get up again with the assistance of her husband and fell due to weakness. At that time she was transported outside hospital. The patient's husband reports she's had no sick contacts or consumed any raw food. At outside hospital where she underwent CT imaging of the brain and while returning to the emergency department had an apparent seizure followed by a second seizure and ultimately went into ventricular fibrillation with cardiac arrest requiring defibrillation 3 times. At that time patient was on dopamine for shock and was given 1 dose of vancomycin as well as Zosyn. The patient was ultimately intubated at outside hospital. Overnight the patient was weaned off of vasopressor infusion but had questionable episodes of seizure-like activity. Patient was given an IV load of Dilantin followed by a Dilantin drip. The patient was noted to progress to oliguric renal failure just prior to transfer. Patient did reportedly have a fever to 102F at outside hospital as well.  SUBJECTIVE:  On pressure support.    VITAL SIGNS: BP 118/63 mmHg   Pulse 71  Temp(Src) 98.6 F (37 C) (Oral)  Resp 24  Ht 5\' 6"  (1.676 m)  Wt 241 lb 10 oz (109.6 kg)  BMI 39.02 kg/m2  SpO2 100%  HEMODYNAMICS:    VENTILATOR SETTINGS: Vent Mode:  [-] PRVC FiO2 (%):  [40 %] 40 % Set Rate:  [16 bmp] 16 bmp Vt Set:  [550 mL] 550 mL PEEP:  [5 cmH20] 5 cmH20 Pressure Support:  [5 cmH20] 5 cmH20 Plateau Pressure:  [18 cmH20-20 cmH20] 18 cmH20  INTAKE / OUTPUT: I/O last 3 completed shifts: In: 2618 [I.V.:365; NG/GT:2045; IV Piggyback:208] Out: [id number] [Urine:1235; Stool:150]  PHYSICAL EXAMINATION:  Gen: ill appearing HEENT: Pupils reactive, ETT in place PULM: no wheeze CV: Irreg irreg rhythm, normal rate, systolic murmur GI: BS+, soft, nontender Derm: no cyanosis or rash Neuro: opens eyes with stimulation, moves Rt leg, nsc  LABS; CBC Recent Labs     08/18/15  1024  08/19/15  0455  08/20/15  0448  WBC  10.3  11.4*  11.2*  HGB  8.9*  9.3*  8.3*  HCT  30.7*  31.3*  27.9*  PLT  222  252  237   Coag's No results for input(s): APTT, INR in the last 72 hours.  BMET Recent Labs     08/18/15  0500  08/19/15  0455  08/20/15  0448  NA  141  142  139  K  3.7  4.1  4.0  CL  99*  100*  98*  CO2  28  25  28   BUN  91*  134*  80*  CREATININE  4.65*  5.40*  3.62*  GLUCOSE  247*  280*  215*   Electrolytes Recent Labs     08/18/15  0500  08/19/15  0455  08/20/15  0448  CALCIUM  8.5*  8.9  8.5*  PHOS  6.1*  7.4*  6.3*   Sepsis Markers No results for input(s): PROCALCITON, O2SATVEN in the last 72 hours.  Invalid input(s): LACTICACIDVEN  ABG No results for input(s): PHART, PCO2ART, PO2ART in the last 72 hours.  Liver Enzymes Recent Labs     08/18/15  0500  08/19/15  0455  08/20/15  0448  ALBUMIN  2.2*  2.4*  2.2*   Cardiac Enzymes No results for input(s): TROPONINI, PROBNP in the last 72 hours.  Glucose Recent Labs     08/19/15  1128  08/19/15  1523  08/19/15  2013  08/20/15  0038  08/20/15  0429  08/20/15  0821   GLUCAP  253*  185*  195*  200*  198*  220*   Imaging Dg Chest Port 1 View  08/20/2015  CLINICAL DATA:  Respiratory failure, acute CVA, acute renal insufficiency, sepsis. EXAM: PORTABLE CHEST 1 VIEW COMPARISON:  Portable chest x-ray of August 19, 2015 FINDINGS: The lungs are adequately inflated. There are persistent coarse lung markings in the infrahilar regions bilaterally. The hemidiaphragms are well demonstrated. There is no significant pleural effusion and there is no pneumothorax. The cardiac silhouette remains enlarged. The central pulmonary vascularity remains mildly engorged and the pulmonary interstitial markings are slightly more conspicuous overall. The endotracheal tube tip projects 6 cm above the carina. The esophagogastric tube's proximal port is at the GE junction with the tip in the proximal gastric body. The right internal jugular venous catheter tip projects over the proximal SVC. IMPRESSION: Slight interval increase conspicuity of atelectasis or infiltrate in the lower lobes. Slight interval increase in conspicuity of the pulmonary interstitium which may reflect low-grade interstitial edema. Stable cardiomegaly. Advancement of the nasogastric tube by 5-10 cm is recommended to assure that the proximal port is below the GE junction. Electronically Signed   By: David  Martinique M.D.   On: 08/20/2015 07:30   Dg Chest Port 1 View  08/19/2015  CLINICAL DATA:  Acute respiratory failure . EXAM: PORTABLE CHEST 1 VIEW FINDINGS: Endotracheal tube, NG tube, right IJ line in stable position. Mediastinum and hilar structures normal. Low lung volumes with mild bibasilar atelectasis and/or infiltrates again noted. Small left pleural effusion. No pneumothorax. IMPRESSION: 1. Lines and tubes in stable position. 2. Stable cardiomegaly. 3. Persistent mild bibasilar atelectasis and/or infiltrates. Small left pleural effusion . Electronically Signed   By: Marcello Moores  Register   On: 08/19/2015 07:20    STUDIES:   12/03 CT head ([facility]) >> atrophy 12/04 CT head >> acute Rt parietal occipital infarct, Rt cerebellar infarct 12/04 U/S Abd >> unremarkable appearance of kidneys 12/05 MRI/MRA brain >> numerous b/l infarcts concerning for embolic infarcts [id number] Echo >> EF 50%, diffuse hypokinesis, grade 1 diastolic dysfx, mod AS, mod LA dilation 12/05 EEG >> generalized slowing 12/05 MRI lumbar spine >> L2-3, L3-4 spinal stenosis, degenerative arthropathy L3-4, L4-5 12/8 MRI brain >> no change in acute infarcts  12/8 LP > only 4 WBC, 30 RBC, glucose 162, Protein normal 12/8 EEG > generalized slowing, occassional broad spike  CULTURES: 12/04 Blood >> negative 12/04 Respiratory viral panel >> negative 12/04 Pneumococcal Ag >> negative 12/05 CSF >> Negative 12/06 Legionella Ag >> negative 12/8 CSF >>  12/8 CSF HSV PCR > negative  ANTIBIOTICS: 12/04 Tressie Ellis >> 12/8 12/04 Ampicillin >> 12/07 12/04 Vancomycin >>  12/8 12/06 Diflucan >>12/8 12/10 Doxycycline >>   SIGNIFICANT EVENTS: 12/03 Admit to OSH w/ Seizure & Fall 12/04 Transfer to [facility]; weaned off pressors; neurology consulted 12/05 Difficulty with LP due to spinal stenosis >> only cultures sent; add HCO3 to IV fluid 12/06 Nephrology consulted; d/c HCO3 from IV fluid 12/9 Start HD>>12/13 stopped  LINES/TUBES: 12/03 ETT ([facility]) >> 12/9 R IJ HD cath >   DISCUSSION: 69 yo female presented to [facility] with 2 weeks of HA, and had developed fever up to 102F.  She had fall at home with Rtward gaze and Lt sided weakness.  She is reported to have seizure at [facility].  There is also report of ventricular fibrillation at [facility] >> ?if this was ECG artifact in setting of seizure.  She has hx of DM, HTN, TIA, Hypothyroidism, Neuropathy, Aortic stenosis, CAD, GERD, A fib, Rt foot osteomyelitis.  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 Hx of DM  neuropathy  P:   RASS goal 0 AED's per neurology  Hold outpt cymbalta Re-assess mental status after further HD, not improved 12/13, needs EOL discussion.  PULMONARY A: Acute hypoxic respiratory failure and concern for pneumonia > improved but mental status limits extubation. Interstitial edema with pleural effusions > improved P:   PSV as tolerated. F/u CXR. No weaning given mental status. Need to discuss trach/peg vs terminal extubation.  CARDIOVASCULAR A:  Hx of A fib >> not on anticoagulation as outpt. Hx of HTN, CAD, HLD, mod aortic stenosis. VF arrest at outside hospital >> happened with seizure, might have been ECG artifact in setting of seizure. Elevated troponin >> demand ischemia. Intermittent bradycardia. Hypertension. P:  Tele. Continue metoprolol, hydralazine, norvasc, lipitor, ASA. Defer cardiology assessment until mental status improved, not improving. Hold outpt cozaar. Plan to start anticoagulation around day 7 after stroke (08/19/15) > defer to neurology, hold 12/13 with dropping h/h.  RENAL Lab Results  Component Value Date   CREATININE 3.62* 08/20/2015   CREATININE 5.40* 08/19/2015   CREATININE 4.65* 08/18/2015   A:   AKI with oliguria >> baseline creatinine 0.8 from August 2014. P:   HD per renal, no improvement in mental status despite HD, No further HD per renal service   GASTROINTESTINAL A:   Nutrition. P:   Tube feeds Protonix for SUP  HEMATOLOGIC  Recent Labs  08/19/15 0455 08/20/15 0448  HGB 9.3* 8.3*   A:   Anemia of critical illness and chronic disease. P:  F/u CBC SCD's, SQ heparin Monitor for bleeding  INFECTIOUS A:   Septic shock >> likely from pneumonia with possible viral prodrome> resolved. Chronic Rt foot osteomyelitis. Yeast UTI. P:   Continue home doxycycline for osteomyelitis  ENDOCRINE CBG (last 3)   Recent Labs  08/20/15 0038 08/20/15 0429 08/20/15 0821  GLUCAP 200* 198* 220*   A:   Hx of DM,  hypothyroidism Worsening hyperglycemia P:   SSI with lantus(12/13 lantus increased to 55) Hold outpt glimepiride, metformin Continue synthroid  Goals of Care >> mental not improving 12/13 despite HD. Renal not supportive of further dialysis. ? Family discussion on goals of care.  Richardson Landry Minor ACNP Maryanna Shape PCCM Pager [phone] till 3 pm If no answer page [phone] 08/20/2015, 10:45 AM  Attending Note:  69 year old female with a very large ischemic stroke who has not woken up.  The thought was it could be meningitis but LP was negative then uremic encephalopathy but did not improve with dialysis, then sedation but last time she received sedation was 12/8 and has been dialyzed since with no improvement.  At this point she is no longer a dialysis candidate and will not be extubatable except terminally, doubt will be able to protect her airway.  There is no family currently bedside but will need to have a conversation regarding plan of care as per RN they do not wish for a trach/peg but they wish patient remain a full code.  The patient is critically ill with multiple organ systems failure and requires high complexity decision making for assessment and support, frequent evaluation and titration of therapies, application of advanced monitoring technologies and extensive interpretation of multiple databases.   Critical Care Time devoted to patient care services described in this note is  35  Minutes. This time reflects time of care of this signee Dr Jennet Maduro. This critical care time does not reflect procedure time, or teaching time or supervisory time of PA/NP/Med student/Med Resident etc but could involve care discussion time.  Rush Farmer, M.D. [facility] Pulmonary/Critical Care Medicine. Pager: [phone]. After hours pager: [phone].

## 2015-08-20 NOTE — Progress Notes (Signed)
Patient ID: NATANIA TOYNE, female   DOB: 14-May-1946, 69 y.o.   MRN: [id number] S:intubated and unresponsive O:BP 124/108 mmHg  Pulse 73  Temp(Src) 99 F (37.2 C) (Oral)  Resp 22  Ht 5\' 6"  (1.676 m)  Wt 109.6 kg (241 lb 10 oz)  BMI 39.02 kg/m2  SpO2 100%  Intake/Output Summary (Last 24 hours) at 08/20/15 0759 Last data filed at 08/20/15 0700  Gross per 24 hour  Intake   1823 ml  Output    615 ml  Net   1208 ml   Intake/Output: I/O last 3 completed shifts: In: 2618 [I.V.:365; NG/GT:2045; IV Piggyback:208] Out: [id number] [Urine:1235; Stool:150]  Intake/Output this shift:    Weight change: 6 kg (13 lb 3.6 oz) Gen:WD WF intubated and unresponsive CVS:no rub Resp:scattered rhonchi Abd:+bs, soft Ext:no edema   Recent Labs Lab 08/14/15 0335 08/15/15 0303 08/16/15 0250 08/17/15 0415 08/18/15 0500 08/19/15 0455 08/20/15 0448  NA 142 143 142 143 141 142 139  K 3.6 3.6 3.7 4.0 3.7 4.1 4.0  CL 105 101 103 98* 99* 100* 98*  CO2 22 24 26 30 28 25 28   GLUCOSE 194* 282* 314* 306* 247* 280* 215*  BUN 76* 96* 120* 92* 91* 134* 80*  CREATININE 6.06* 6.71* 7.21* 5.21* 4.65* 5.40* 3.62*  ALBUMIN 2.1* 2.1*  --  2.3* 2.2* 2.4* 2.2*  CALCIUM 7.1* 7.9* 8.3* 8.4* 8.5* 8.9 8.5*  PHOS 7.9* 9.2*  --  6.7* 6.1* 7.4* 6.3*   Liver Function Tests:  Recent Labs Lab 08/18/15 0500 08/19/15 0455 08/20/15 0448  ALBUMIN 2.2* 2.4* 2.2*   No results for input(s): LIPASE, AMYLASE in the last 168 hours.  Recent Labs Lab 08/17/15 1210  AMMONIA 22   CBC:  Recent Labs Lab 08/16/15 0250 08/17/15 0415 08/18/15 1024 08/19/15 0455 08/20/15 0448  WBC 10.4 11.7* 10.3 11.4* 11.2*  NEUTROABS 8.9* 9.4* 8.5* 9.4*  --   HGB 9.8* 10.0* 8.9* 9.3* 8.3*  HCT 32.4* 32.5* 30.7* 31.3* 27.9*  MCV 88.0 89.0 90.8 89.7 90.0  PLT 224 251 222 252 237   Cardiac Enzymes: No results for input(s): CKTOTAL, CKMB, CKMBINDEX, TROPONINI in the last 168 hours. CBG:  Recent Labs Lab 08/19/15 1128 08/19/15 1523  08/19/15 2013 08/20/15 0038 08/20/15 0429  GLUCAP 253* 185* 195* 200* 198*    Iron Studies: No results for input(s): IRON, TIBC, TRANSFERRIN, FERRITIN in the last 72 hours. Studies/Results: Dg Chest Port 1 View  08/20/2015  CLINICAL DATA:  Respiratory failure, acute CVA, acute renal insufficiency, sepsis. EXAM: PORTABLE CHEST 1 VIEW COMPARISON:  Portable chest x-ray of August 19, 2015 FINDINGS: The lungs are adequately inflated. There are persistent coarse lung markings in the infrahilar regions bilaterally. The hemidiaphragms are well demonstrated. There is no significant pleural effusion and there is no pneumothorax. The cardiac silhouette remains enlarged. The central pulmonary vascularity remains mildly engorged and the pulmonary interstitial markings are slightly more conspicuous overall. The endotracheal tube tip projects 6 cm above the carina. The esophagogastric tube's proximal port is at the GE junction with the tip in the proximal gastric body. The right internal jugular venous catheter tip projects over the proximal SVC. IMPRESSION: Slight interval increase conspicuity of atelectasis or infiltrate in the lower lobes. Slight interval increase in conspicuity of the pulmonary interstitium which may reflect low-grade interstitial edema. Stable cardiomegaly. Advancement of the nasogastric tube by 5-10 cm is recommended to assure that the proximal port is below the GE junction. Electronically Signed   By:  David  Martinique M.D.   On: 08/20/2015 07:30   Dg Chest Port 1 View  08/19/2015  CLINICAL DATA:  Acute respiratory failure . EXAM: PORTABLE CHEST 1 VIEW FINDINGS: Endotracheal tube, NG tube, right IJ line in stable position. Mediastinum and hilar structures normal. Low lung volumes with mild bibasilar atelectasis and/or infiltrates again noted. Small left pleural effusion. No pneumothorax. IMPRESSION: 1. Lines and tubes in stable position. 2. Stable cardiomegaly. 3. Persistent mild bibasilar  atelectasis and/or infiltrates. Small left pleural effusion . Electronically Signed   By: Marcello Moores  Register   On: 08/19/2015 07:20   . amLODipine  10 mg Per Tube q morning - 10a  . antiseptic oral rinse  7 mL Mouth Rinse 10 times per day  . aspirin  325 mg Per Tube Daily  . atorvastatin  80 mg Per Tube QPM  . chlorhexidine gluconate  15 mL Mouth Rinse BID  . doxycycline  100 mg Oral Q12H  . feeding supplement (PRO-STAT SUGAR FREE 64)  30 mL Per Tube BID  . feeding supplement (VITAL HIGH PROTEIN)  1,000 mL Per Tube Q24H  . heparin subcutaneous  5,000 Units Subcutaneous 3 times per day  . hydrALAZINE  50 mg Oral 3 times per day  . insulin aspart  0-20 Units Subcutaneous 6 times per day  . insulin glargine  45 Units Subcutaneous Daily  . levothyroxine  100 mcg Per Tube QAC breakfast  . metoprolol tartrate  25 mg Oral BID  . pantoprazole sodium  40 mg Per Tube Daily  . phenytoin (DILANTIN) IV  200 mg Intravenous Q12H    BMET    Component Value Date/Time   NA 139 08/20/2015 0448   K 4.0 08/20/2015 0448   CL 98* 08/20/2015 0448   CO2 28 08/20/2015 0448   GLUCOSE 215* 08/20/2015 0448   BUN 80* 08/20/2015 0448   CREATININE 3.62* 08/20/2015 0448   CALCIUM 8.5* 08/20/2015 0448   GFRNONAA 12* 08/20/2015 0448   GFRAA 14* 08/20/2015 0448   CBC    Component Value Date/Time   WBC 11.2* 08/20/2015 0448   RBC 3.10* 08/20/2015 0448   HGB 8.3* 08/20/2015 0448   HCT 27.9* 08/20/2015 0448   PLT 237 08/20/2015 0448   MCV 90.0 08/20/2015 0448   MCH 26.8 08/20/2015 0448   MCHC 29.7* 08/20/2015 0448   RDW 15.5 08/20/2015 0448   LYMPHSABS 0.8 08/19/2015 0455   MONOABS 1.1* 08/19/2015 0455   EOSABS 0.1 08/19/2015 0455   BASOSABS 0.0 08/19/2015 0455    Background per Dr. Sanda Klein progress note from 08/18/15 70 y.o. year-old 69 yo female with background hx of DM, HTN, TIA, hypothyroidism, neuropathy, aortic stenosis, CAD, GERD, A fib, Rt foot osteomyelitis. Admitted to [facility] after  presenting with HA, recent fevers to 102, fall at home, possible sz, acute encephalopathy with findings of acute bilateral embolic infarcts on imaging studies. Had VF arrest requiring defib X 3. Developed septic shock (felt 2/2 PNA vs viral syndrome) requiring pressors, VDRF. She was transferred here for further management. We are asked to see b/o AKI. Creatinine on admission to [facility] was 0.75 on 08/10/15, up to 2.7 on 12/4 day of transfer->3.9 on arrival here 12/4 and has continued to rise - up to 5.44 12/6 with minimal UOP. She was on benazepril prior to admission. Received CT scans at [facility] on 12/3 but I don't believe any contrast was administered. UA there showed >300 protein, large blood. US shows large kidneys (c/w DM). UPC  1.6 gm proteinuria (c/w DM) 6-30 RBC foley specimen.    Assessment/Plan:  1. AKI- h/o DM, HTN, proteinuria, non-oliguric but with worsening azotemia. Presumably due to ischemic ATN in setting of shock/sepsis/cardiac arrest/in setting of ace-inhibition. S/p 2 HD sessions and reassess need for ongoing dialysis. 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  1. S/p session of HD yesterday without significant improvement of mental status. 2. Her neurologic findings are not consistent with uremic encephalopathy (paraplegia in setting of embolic strokes is more likely than uremia as cause).  3. Since there has been no significant improvement after 3rd session, would recommend palliative care consult to help set goals/limits of care.  4. She is not a dialysis candidate in her current state and again her AMS preceded her ARF and has new embolic strokes which are more likely the etiology.  No more hemodialysis. 2. VDRF- per PCCM 3. Bilateral cerebral emboli with seizures. Neuro following.  4. Encephalopathy- LP negative, bilateral strokes, possible anoxic injury from arrest. No sedation since  08/13/15. NOT related to uremia as her AMS predated ARF.  5. Sepsis- fortaz/vanco/diflucan stopped. Resolved SIRS. 6. HTN- per PCCM 7. DM 8. A fib 9. Disposition- poor prognosis and recommend transition to comfort measures only as she has not responded to hemodialysis and her AMS is likely due to embolic strokes and its sequelae.   [place] A

## 2015-08-20 NOTE — Progress Notes (Addendum)
STROKE TEAM PROGRESS NOTE   SUBJECTIVE (INTERVAL HISTORY) Her RN and   sister are at the bedside. She has no significant change since yesterday. Still opens eyes but not following commands.  She has not shown any improvement after her third hemodialysis session  OBJECTIVE Temp:  [98.2 F (36.8 C)-99.2 F (37.3 C)] 98.9 F (37.2 C) (12/13 1201) Pulse Rate:  [52-82] 74 (12/13 1400) Cardiac Rhythm:  [-] Normal sinus rhythm (12/13 1400) Resp:  [15-27] 21 (12/13 1400) BP: (80-147)/(35-108) 123/49 mmHg (12/13 1400) SpO2:  [100 %] 100 % (12/13 1400) FiO2 (%):  [40 %] 40 % (12/13 1137) Weight:  [241 lb 10 oz (109.6 kg)-252 lb 6.8 oz (114.5 kg)] 241 lb 10 oz (109.6 kg) (12/13 0500)  CBC:   Recent Labs Lab 08/18/15 1024 08/19/15 0455 08/20/15 0448  WBC 10.3 11.4* 11.2*  NEUTROABS 8.5* 9.4*  --   HGB 8.9* 9.3* 8.3*  HCT 30.7* 31.3* 27.9*  MCV 90.8 89.7 90.0  PLT 222 252 [id number]    Basic Metabolic Panel:   Recent Labs Lab 08/14/15 0335  08/19/15 0455 08/20/15 0448  NA 142  < > 142 139  K 3.6  < > 4.1 4.0  CL 105  < > 100* 98*  CO2 22  < > 25 28  GLUCOSE 194*  < > 280* 215*  BUN 76*  < > 134* 80*  CREATININE 6.06*  < > 5.40* 3.62*  CALCIUM 7.1*  < > 8.9 8.5*  MG 2.0  --   --   --   PHOS 7.9*  < > 7.4* 6.3*  < > = values in this interval not displayed.  Lipid Panel:     Component Value Date/Time   CHOL 127 08/13/2015 0238   TRIG 191* 08/13/2015 0238   TRIG 195* 08/13/2015 0238   HDL 28* 08/13/2015 0238   CHOLHDL 4.5 08/13/2015 0238   VLDL 39 08/13/2015 0238   LDLCALC 60 08/13/2015 0238   HgbA1c:  Lab Results  Component Value Date   HGBA1C 7.4* 08/12/2015   Urine Drug Screen:     Component Value Date/Time   LABOPIA POSITIVE* 08/12/2015 1430   COCAINSCRNUR NONE DETECTED 08/12/2015 1430   LABBENZ POSITIVE* 08/12/2015 1430   AMPHETMU NONE DETECTED 08/12/2015 1430   THCU NONE DETECTED 08/12/2015 1430   LABBARB NONE DETECTED 08/12/2015 1430      IMAGING I have  personally reviewed the radiological images below and agree with the radiology interpretations.  CT HEAD 09/03/2015   Acute moderate RIGHT parietal occipital infarct (watershed versus angular branch of the MCA). Small acute RIGHT cerebellar infarct. Small area RIGHT frontal encephalomalacia may be posttraumatic or ischemic.   US Abdomen Complete 08/12/2015  1. Status post cholecystectomy. No acute abnormality seen within the abdomen. 2. Kidneys unremarkable in appearance. 3. Scattered calcific atherosclerotic disease along the abdominal aorta.     MRI and MRA HEAD 08/12/2015  Exam is motion degraded. Numerous bilateral acute/subacute nonhemorrhagic infarcts. Most confluent infarcts have an appearance of acute/subacute infarct involves portions of the right frontal -parietal - occipital and posterior right temporal lobe. Swelling of gyri without significant local mass effect. Smaller acute nonhemorrhagic infarcts scattered throughout the hemispheres bilaterally (frontal lobes, parietal lobes, occipital lobes), right temporal lobe/subinsular region and cerebellum bilaterally. The involvement of multiple vascular distributions raises possibility of embolic disease. Remote small right cerebellar infarct. Exam is slightly motion degraded. Mild irregularity and slight narrowing cavernous segment right internal carotid artery. Small bulge may be related  to atherosclerotic type changes rather than aneurysm. No significant stenosis of either carotid terminus or M1 segment of either middle cerebral artery. Middle cerebral artery branch vessel irregularity and narrowing bilaterally. Mild narrowing and irregularity A1 segment right anterior cerebral artery. Bulge of the distal A1 segment of the right anterior cerebral artery appears to be origin of a vessel rather than saccular aneurysm. Mild to moderate narrowing A2 segment right anterior cerebral artery. Left vertebral artery is dominant. Mild to moderate narrowing  distal right vertebral artery. Mild to moderate narrowing portions of the posterior inferior cerebellar artery bilaterally. Mild to moderate narrowing portions of the anterior inferior cerebellar artery bilaterally. Narrowing of the superior cerebellar artery more notable on the left. Mild to moderate narrowing portions of the proximal, mid and distal aspect of the posterior cerebral artery bilaterally. Tiny bulge superior margin P1 segments left posterior cerebral artery. Question tiny aneurysm (less than 2 mm). A vessel may rise from this region.   Mr Brain Wo Contrast  08/15/2015  IMPRESSION: Multiple areas of acute infarction are unchanged from 08/12/2015. Negative for hemorrhage. Findings suggest cerebral emboli.  14:08   CUS - Bilateral: 1-39% ICA stenosis. Vertebral artery flow is antegrade.  2D echo - Normal LV size with EF 50%, diffuse hypokinesis. Mildly dilated RV with normal systolic function. Biatrial enlargement. Moderate aortic stenosis. Dilated IVC suggests elevated RV filling pressure. Mild pulmonary hypertension.  Mr Lumbar Spine Wo Contrast  08/12/2015  IMPRESSION: No complications seen following unsuccessful lumbar puncture. Mild multifactorial spinal stenosis at L2-3 and L3-4 could have contributed to the difficulty at at lumbar puncture. Degenerative facet arthropathy at L3-4 and L4-5. Should a lumbar puncture be repeated, greater likelihood of success might be obtained at the L4-5 or L5-S1 level.  Dg Chest Port 1 View 08/16/2015  IMPRESSION: Allowing for differences in patient positioning there has been overall improvement in the appearance of the pulmonary interstitium especially on the right. There is persistent left lower lobe atelectasis or pneumonia with small left pleural effusion. Stable cardiomegaly. The support tubes are in reasonable position.   08/13/2015  IMPRESSION: 1. Persistent left lower lobe atelectasis and small left pleural effusion. Stable mild  low-grade CHF. The endotracheal tube is in reasonable position. 2. The nasogastric tube tip and proximal port lie in the region of the gastric body below the expected location of the GE junction.   EEG - This EEG is abnormal with severe generalized continuous nonspecific slowing cerebral activity. This pattern of slowing can be seen with metabolic and toxic encephalopathies, as well as with severe degenerative central nervous system disorders. No evidence of epileptiform activity was recorded.   Repeat EEG  08/16/2015 Impression: This EEG is abnormal due to the presence of: 1. Moderate diffuse slowing of the background 2. Occasional broad sharp waves over the right frontotemporal and right frontal regions   Portable chest x-ray 1 view 08/18/2015 1. Support apparatus satisfactory. 2. Improved aeration in the lung bases since yesterday, with only mild atelectasis persisting at the right base. 3.No acute cardiopulmonary disease otherwise.   Physical exam  Temp:  [98.2 F (36.8 C)-99.2 F (37.3 C)] 98.9 F (37.2 C) (12/13 1201) Pulse Rate:  [52-82] 74 (12/13 1400) Resp:  [15-27] 21 (12/13 1400) BP: (80-147)/(35-108) 123/49 mmHg (12/13 1400) SpO2:  [100 %] 100 % (12/13 1400) FiO2 (%):  [40 %] 40 % (12/13 1137) Weight:  [241 lb 10 oz (109.6 kg)-252 lb 6.8 oz (114.5 kg)] 241 lb 10 oz (109.6 kg) (12/13 0500)  General - Well nourished, well developed Caucasian lady intubated HEENT: NCAT; PERRL; sclera slightly injected; intubated Cardiovascular - irregular rhythm and rate. Pulmonary: CTA Abdomen: soft: ND Extremities: ankle edema  Neuro Mental Status:  Opens eyes spontaneously; does not follow commands.  Cranial Nerves PERRL; no blink to threat; OCRs intact; eye closure grossly symmetric.  Spontaneous cough; breathing over the vent  Motor/Sensory: Left UE with extn to noxious stimuli; right upper extremity with w/d to noxious stimuli; right LE slight w/d and left LE no response  to noxious stimuli  Coordination/Gait: Deferred due to LOC    ASSESSMENT/PLAN Ms. MARYAGNES KETTEL is a 69 y.o. female with history of DM, HTN, CAD, atrial fibrillation, TIA, GI bleed presenting with complaints of a HA who fell at home due to weakness who developed seizures followed by v fib/cardiac arrest at an outlying hospital. She was intubated and transferred to done where she was found to have a R parietal occipital infarct and small R cerebellar infarct. She did not receive IV t-PA due to unknown time last known well.   Stroke:  bilateral anterior and posterior, infra and supratentorial infarcts, embolic secondary to known atrial fibrillation not on AC  Resultant  AMS, seizure and BUE plegia,  Multifactorial mental status change likely from sepsis, uremia, strokes  MRI  bilateral anterior and posterior, infra and supratentorial infarcts, largest at right temporal lobe at MCA distribution  MRA diffuse intracranial atherosclerosis, ? Tiny L PCA aneurysm  Repeat MRI brain 08/15/15 no change  Carotid Doppler  Unremarkable   Ammonia level 22 (WNL) on 08/17/2015  2D Echo  EF 50%  LDL 60  HgbA1c 7.4  Heparin 5000 units sq tid for VTE prophylaxis Diet NPO time specified  No antithrombotic prior to admission, now on ASA 325mg  for stroke prevention. No AC at this time due to risk of hemorrhagic transformation in the setting of large infarcts at right temporal lobe. Will consider AC in 5-7 days post stroke and if no planned procedures.   Ongoing aggressive stroke risk factor management  Therapy recommendations:  pending  Disposition:  pending  Seizure  Likely secondary to cortical infarcts  EEG no seizure  Repeat EEG today on 08/16/2015 did not show seizure   Dilantin level daily 08/18/2015 - <2.5; corrected level 7 or 8  Discussed with pharmacist and Dr Belenda Cruise - increase dose to 200 mg Q12 hours.    free level in AM today pending.May need to give additional dose after  dialysis if level is hard to maintain  Uremic encephalopathy  Pt still open eyes but not tracking and not moving LEs as before  MRI no acute changes comparing with initial MRI  LP not supportive for CNS infection   Neuro change likely due to uremic encephalopathy  Ammonia level 22 (WNL) on 08/17/2015  AKI and uremia  CK 134  Cre 3.91 -> 4.26 -> 5.44-> 6.06-> 6.71->7.21-->5.2 -> 4.65  BUN 91 Sunday  Nephrology on board  On HD   ? Sepsis  procalcitonin 19.06 -> 16.71->12.60  afebrile  Temp 100.8  today (08/18/2015) CBC with diff. Pending  Portable chest x-ray today - improved (see above)  CSF cultures no growth so far  Check urine  Off antibiotics  CSF not supportive for CNS infection  Pt afebrile, WBC normolized  Vent dependent respiratory failure  Intubated  CCM managing  Likely need trach soon  Atrial Fibrillation  Home anticoagulation:  No antithrombotics at home  CHA2DS2-VASc Score = 6, ?2 oral  anticoagulation recommended  Age in Years:  39-74   +1   Sex:  Female   +1    Hypertension History: yes   +1     Diabetes Mellitus:  yes   +1   Congestive Heart Failure History:  0  Vascular Disease History:  0     Stroke/TIA/Thromboembolism History: yes   +2  On ASA for now. Hold off AC for 7 days to avoid hemorrhagic transformation and if no planned procedures.  Would reconsider on Monday.  Possibly repeat CT scan then to ensure no medical contraindication to [facility]    Hypertension  Still elevated likely due to AKI  BP goal normotensive.  Hyperlipidemia  Home meds:  lipitor 80 resumed in hospital  LDL 60, goal < 70  Continue statin at discharge  Diabetes type II  HgbA1c 7.4, goal < 7.0  Not controlled  SSI  Other Stroke Risk Factors  Advanced age  Former Cigarette smoker, quit smoking 22 years ago   Morbid Obesity, Body mass index is 39.02 kg/(m^2).   Hx stroke/TIA  Coronary artery disease  Aortic stenosis  Anemia H/H   10.0 / 32.5   Other Active Problems  Hypothyroid  Osteomyelitis R foot  Hospital day # 9   Patient was seen and examined by me personally. The laboratory and radiographic studies reviewed by me. I had a long discussion with the patient's sister  regarding her prognosis which appears quite poor. She understands this but wants to support her for a few more days and do at least one more dialysis and then make a decision. She agrees she would not want prolonged ventilatory support, tracheostomy or PEG tube. Recommend palliative care consult if family is unable to make decision about withdrawal of care soon. This patient is critically ill and at significant risk of neurological worsening, death and care requires constant monitoring of vital signs, hemodynamics,respiratory and cardiac monitoring, extensive review of multiple databases, frequent neurological assessment, discussion with family, other specialists and medical decision making of high complexity.I have made any additions or clarifications directly to the above note.This critical care time does not reflect procedure time, or teaching time or supervisory time of PA/NP/Med Resident etc but could involve care discussion time.  I spent 30 minutes of neurocritical care time  in the care of  this patient.       SIGNED BY: Antony Contras, MD   To contact Stroke Continuity provider, please refer to [email or web address] After hours, contact General Neurology

## 2015-08-21 ENCOUNTER — Inpatient Hospital Stay (HOSPITAL_COMMUNITY): Payer: Medicare Other

## 2015-08-21 LAB — CBC
HCT: 26.3 % — ABNORMAL LOW (ref 36.0–46.0)
Hemoglobin: 7.7 g/dL — ABNORMAL LOW (ref 12.0–15.0)
MCH: 26.5 pg (ref 26.0–34.0)
MCHC: 29.3 g/dL — AB (ref 30.0–36.0)
MCV: 90.4 fL (ref 78.0–100.0)
Platelets: 229 10*3/uL (ref 150–400)
RBC: 2.91 MIL/uL — ABNORMAL LOW (ref 3.87–5.11)
RDW: 15.3 % (ref 11.5–15.5)
WBC: 12.1 10*3/uL — ABNORMAL HIGH (ref 4.0–10.5)

## 2015-08-21 LAB — GLUCOSE, CAPILLARY
GLUCOSE-CAPILLARY: 218 mg/dL — AB (ref 65–99)
GLUCOSE-CAPILLARY: 347 mg/dL — AB (ref 65–99)
Glucose-Capillary: 169 mg/dL — ABNORMAL HIGH (ref 65–99)
Glucose-Capillary: 170 mg/dL — ABNORMAL HIGH (ref 65–99)

## 2015-08-21 LAB — PROTIME-INR
INR: 1.22 (ref 0.00–1.49)
Prothrombin Time: 15.6 seconds — ABNORMAL HIGH (ref 11.6–15.2)

## 2015-08-21 LAB — MAGNESIUM: MAGNESIUM: 2.1 mg/dL (ref 1.7–2.4)

## 2015-08-21 LAB — BLOOD GAS, ARTERIAL
ACID-BASE EXCESS: 0.6 mmol/L (ref 0.0–2.0)
Bicarbonate: 24.3 mEq/L — ABNORMAL HIGH (ref 20.0–24.0)
DRAWN BY: 44135
FIO2: 0.4
LHR: 16 {breaths}/min
MECHVT: 550 mL
O2 SAT: 96.9 %
PEEP/CPAP: 5 cmH2O
PH ART: 7.443 (ref 7.350–7.450)
Patient temperature: 98.6
TCO2: 25.4 mmol/L (ref 0–100)
pCO2 arterial: 36.1 mmHg (ref 35.0–45.0)
pO2, Arterial: 95.8 mmHg (ref 80.0–100.0)

## 2015-08-21 LAB — BASIC METABOLIC PANEL
Anion gap: 15 (ref 5–15)
BUN: 128 mg/dL — AB (ref 6–20)
CALCIUM: 8.4 mg/dL — AB (ref 8.9–10.3)
CO2: 26 mmol/L (ref 22–32)
CREATININE: 4.7 mg/dL — AB (ref 0.44–1.00)
Chloride: 99 mmol/L — ABNORMAL LOW (ref 101–111)
GFR calc non Af Amer: 9 mL/min — ABNORMAL LOW (ref >60)
GFR, EST AFRICAN AMERICAN: 10 mL/min — AB (ref >60)
Glucose, Bld: 194 mg/dL — ABNORMAL HIGH (ref 65–99)
Potassium: 4.2 mmol/L (ref 3.5–5.1)
SODIUM: 140 mmol/L (ref 135–145)

## 2015-08-21 LAB — PHOSPHORUS: PHOSPHORUS: 8.9 mg/dL — AB (ref 2.5–4.6)

## 2015-08-21 LAB — APTT: aPTT: 25 seconds (ref 24–37)

## 2015-08-21 MED ORDER — MORPHINE BOLUS VIA INFUSION
5.0000 mg | INTRAVENOUS | Status: DC | PRN
  Administered 2015-08-21: 2.5 mg via INTRAVENOUS
  Administered 2015-08-22: 15.5 mg via INTRAVENOUS
  Administered 2015-08-22: 5 mg via INTRAVENOUS
  Filled 2015-08-21 (×4): qty 20

## 2015-08-21 MED ORDER — INSULIN GLARGINE 100 UNIT/ML ~~LOC~~ SOLN
65.0000 [IU] | Freq: Every day | SUBCUTANEOUS | Status: DC
  Filled 2015-08-21: qty 0.65

## 2015-08-21 MED ORDER — ROCURONIUM BROMIDE 50 MG/5ML IV SOLN
1.0000 mg/kg | Freq: Once | INTRAVENOUS | Status: AC
  Administered 2015-08-21: 50 mg via INTRAVENOUS
  Filled 2015-08-21: qty 11.03

## 2015-08-21 MED ORDER — INSULIN GLARGINE 100 UNIT/ML ~~LOC~~ SOLN
10.0000 [IU] | Freq: Once | SUBCUTANEOUS | Status: AC
  Administered 2015-08-21: 10 [IU] via SUBCUTANEOUS
  Filled 2015-08-21: qty 0.1

## 2015-08-21 MED ORDER — IPRATROPIUM-ALBUTEROL 0.5-2.5 (3) MG/3ML IN SOLN
3.0000 mL | Freq: Four times a day (QID) | RESPIRATORY_TRACT | Status: DC
  Administered 2015-08-21 (×2): 3 mL via RESPIRATORY_TRACT
  Filled 2015-08-21 (×2): qty 3

## 2015-08-21 MED ORDER — MORPHINE SULFATE 25 MG/ML IV SOLN
10.0000 mg/h | INTRAVENOUS | Status: DC
  Administered 2015-08-21: 10 mg/h via INTRAVENOUS
  Administered 2015-08-22: 12.5 mg/h via INTRAVENOUS
  Filled 2015-08-21 (×2): qty 10

## 2015-08-21 MED ORDER — LORAZEPAM 2 MG/ML IJ SOLN
INTRAMUSCULAR | Status: AC
  Filled 2015-08-21: qty 1

## 2015-08-21 MED ORDER — ETOMIDATE 2 MG/ML IV SOLN
0.3000 mg/kg | Freq: Once | INTRAVENOUS | Status: AC
  Administered 2015-08-21: 20 mg via INTRAVENOUS
  Filled 2015-08-21: qty 16.55

## 2015-08-21 NOTE — Progress Notes (Signed)
Patient ID: Melissa Becker, female   DOB: 08-31-1946, 69 y.o.   MRN: [id number] S:no new changes overnight O:BP 161/49 mmHg  Pulse 57  Temp(Src) 98.7 F (37.1 C) (Oral)  Resp 22  Ht 5\' 6"  (1.676 m)  Wt 110.3 kg (243 lb 2.7 oz)  BMI 39.27 kg/m2  SpO2 99%  Intake/Output Summary (Last 24 hours) at 08/21/15 0756 Last data filed at 08/21/15 0700  Gross per 24 hour  Intake   1710 ml  Output    520 ml  Net   1190 ml   Intake/Output: I/O last 3 completed shifts: In: 2474 [I.V.:350; NG/GT:1820; IV Piggyback:304] Out: 655 [Urine:655]  Intake/Output this shift:    Weight change: -4.2 kg (-9 lb 4.2 oz) Gen:WD WF inutbated and opens eyes but does not follow commands or move left side of body CVS:IRR III/VI SEM at LUSB Resp:scattered rhonchi [id number] Ext:no edema   Recent Labs Lab 08/15/15 0303 08/16/15 0250 08/17/15 0415 08/18/15 0500 08/19/15 0455 08/20/15 0448 08/21/15 0410  NA 143 142 143 141 142 139 140  K 3.6 3.7 4.0 3.7 4.1 4.0 4.2  CL 101 103 98* 99* 100* 98* 99*  CO2 24 26 30 28 25 28 26   GLUCOSE 282* 314* 306* 247* 280* 215* 194*  BUN 96* 120* 92* 91* 134* 80* 128*  CREATININE 6.71* 7.21* 5.21* 4.65* 5.40* 3.62* 4.70*  ALBUMIN 2.1*  --  2.3* 2.2* 2.4* 2.2*  --   CALCIUM 7.9* 8.3* 8.4* 8.5* 8.9 8.5* 8.4*  PHOS 9.2*  --  6.7* 6.1* 7.4* 6.3* 8.9*   Liver Function Tests:  Recent Labs Lab 08/18/15 0500 08/19/15 0455 08/20/15 0448  ALBUMIN 2.2* 2.4* 2.2*   No results for input(s): LIPASE, AMYLASE in the last 168 hours.  Recent Labs Lab 08/17/15 1210  AMMONIA 22   CBC:  Recent Labs Lab 08/17/15 0415 08/18/15 1024 08/19/15 0455 08/20/15 0448 08/21/15 0410  WBC 11.7* 10.3 11.4* 11.2* 12.1*  NEUTROABS 9.4* 8.5* 9.4*  --   --   HGB 10.0* 8.9* 9.3* 8.3* 7.7*  HCT 32.5* 30.7* 31.3* 27.9* 26.3*  MCV 89.0 90.8 89.7 90.0 90.4  PLT 251 222 252 237 229   Cardiac Enzymes: No results for input(s): CKTOTAL, CKMB, CKMBINDEX, TROPONINI in the last 168  hours. CBG:  Recent Labs Lab 08/20/15 0821 08/20/15 1202 08/20/15 1534 08/20/15 2010 08/21/15 0007  GLUCAP 220* 235* 198* 156* 169*    Iron Studies: No results for input(s): IRON, TIBC, TRANSFERRIN, FERRITIN in the last 72 hours. Studies/Results: Dg Chest Port 1 View  08/21/2015  CLINICAL DATA:  Sepsis, thrombotic CVA, meningitis, respiratory failure, coronary artery disease EXAM: PORTABLE CHEST 1 VIEW COMPARISON:  Portable chest x-ray of August 20, 2015 FINDINGS: The lungs are adequately inflated. The cardiopericardial silhouette remains enlarged. There is new obscuration of the left hemidiaphragm. The pulmonary interstitial markings are slightly increased bilaterally. The central pulmonary vascularity is engorged. The endotracheal tube tip lies 3.4 cm above the carina. The esophagogastric tube tip projects below the inferior margin of the image. The right internal jugular venous catheter tip projects over the proximal portion of the SVC. IMPRESSION: Mild CHF with interstitial edema and new small left pleural effusion. Stable mild subsegmental atelectasis in the right infrahilar region. Electronically Signed   By: David  Martinique M.D.   On: 08/21/2015 07:23   Dg Chest Port 1 View  08/20/2015  CLINICAL DATA:  Respiratory failure, acute CVA, acute renal insufficiency, sepsis. EXAM: PORTABLE CHEST 1 VIEW COMPARISON:  Portable chest x-ray of August 19, 2015 FINDINGS: The lungs are adequately inflated. There are persistent coarse lung markings in the infrahilar regions bilaterally. The hemidiaphragms are well demonstrated. There is no significant pleural effusion and there is no pneumothorax. The cardiac silhouette remains enlarged. The central pulmonary vascularity remains mildly engorged and the pulmonary interstitial markings are slightly more conspicuous overall. The endotracheal tube tip projects 6 cm above the carina. The esophagogastric tube's proximal port is at the GE junction with the  tip in the proximal gastric body. The right internal jugular venous catheter tip projects over the proximal SVC. IMPRESSION: Slight interval increase conspicuity of atelectasis or infiltrate in the lower lobes. Slight interval increase in conspicuity of the pulmonary interstitium which may reflect low-grade interstitial edema. Stable cardiomegaly. Advancement of the nasogastric tube by 5-10 cm is recommended to assure that the proximal port is below the GE junction. Electronically Signed   By: David  Martinique M.D.   On: 08/20/2015 07:30   . amLODipine  10 mg Per Tube q morning - 10a  . antiseptic oral rinse  7 mL Mouth Rinse 10 times per day  . aspirin  325 mg Per Tube Daily  . atorvastatin  80 mg Per Tube QPM  . chlorhexidine gluconate  15 mL Mouth Rinse BID  . doxycycline  100 mg Oral Q12H  . feeding supplement (PRO-STAT SUGAR FREE 64)  30 mL Per Tube BID  . feeding supplement (VITAL HIGH PROTEIN)  1,000 mL Per Tube Q24H  . heparin subcutaneous  5,000 Units Subcutaneous 3 times per day  . hydrALAZINE  50 mg Oral 3 times per day  . insulin aspart  0-20 Units Subcutaneous 6 times per day  . insulin glargine  55 Units Subcutaneous Daily  . levothyroxine  100 mcg Per Tube QAC breakfast  . metoprolol tartrate  25 mg Oral BID  . pantoprazole sodium  40 mg Per Tube Daily  . phenytoin (DILANTIN) IV  200 mg Intravenous Q12H    BMET    Component Value Date/Time   NA 140 08/21/2015 0410   K 4.2 08/21/2015 0410   CL 99* 08/21/2015 0410   CO2 26 08/21/2015 0410   GLUCOSE 194* 08/21/2015 0410   BUN 128* 08/21/2015 0410   CREATININE 4.70* 08/21/2015 0410   CALCIUM 8.4* 08/21/2015 0410   GFRNONAA 9* 08/21/2015 0410   GFRAA 10* 08/21/2015 0410   CBC    Component Value Date/Time   WBC 12.1* 08/21/2015 0410   RBC 2.91* 08/21/2015 0410   HGB 7.7* 08/21/2015 0410   HCT 26.3* 08/21/2015 0410   PLT 229 08/21/2015 0410   MCV 90.4 08/21/2015 0410   MCH 26.5 08/21/2015 0410   MCHC 29.3* 08/21/2015  0410   RDW 15.3 08/21/2015 0410   LYMPHSABS 0.8 08/19/2015 0455   MONOABS 1.1* 08/19/2015 0455   EOSABS 0.1 08/19/2015 0455   BASOSABS 0.0 08/19/2015 0455     Assessment/Plan:  1. AKI- h/o DM, HTN, proteinuria, non-oliguric but with worsening azotemia. Presumably due to ischemic ATN in setting of shock/sepsis/cardiac arrest/in setting of ace-inhibition. S/p 2 HD sessions and reassess need for ongoing dialysis. 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  1. S/p session of HD yesterday without significant improvement of mental status. 2. Her neurologic findings are not consistent with uremic encephalopathy (paraplegia/hemiplegia in setting of embolic strokes is more likely than uremia as cause).  3. Since there has been  no significant improvement after 3rd session, would recommend palliative care consult to help set goals/limits of care.  4. Abrupt rise in BUN out of proportion to Scr is worrisome for GI bleed.  Again recommend palliative care and CMO. 5. She is not a dialysis candidate in her current state and again her AMS preceded her ARF and has new embolic strokes which are more likely the etiology. No more hemodialysis. 2. VDRF- per PCCM 3. Bilateral cerebral emboli with seizures. Neuro following.  4. ABLA- likely GI source given rising BUN out of proportion to Scr and history of gastric AVM's and black stool in flexiseal.  Plan per PCCM 5. Encephalopathy- LP negative, bilateral strokes, possible anoxic injury from arrest. No sedation since 08/13/15. NOT related to uremia as her AMS predated ARF.  6. Sepsis- fortaz/vanco/diflucan stopped. Resolved SIRS. 7. HTN- per PCCM 8. DM 9. A fib 10. Disposition- poor prognosis and recommend transition to comfort measures only as she has not responded to hemodialysis and her AMS is likely due to embolic strokes and its sequelae.  Alba A

## 2015-08-21 NOTE — Procedures (Signed)
Bronchoscopy Procedure Note CESARIA DEBACA [id number] 25-Jul-1946  Procedure: Bronchoscopy Indications: Diagnostic evaluation of the airways  Procedure Details Consent: Unable to obtain consent because of emergent medical necessity. Time Out: Verified patient identification, verified procedure, site/side was marked, verified correct patient position, special equipment/implants available, medications/allergies/relevent history reviewed, required imaging and test results available.  Performed  In preparation for procedure, patient was given 100% FiO2 and bronchoscope lubricated. Sedation: Etomidate and rocuronium  Airway entered and the following bronchi were examined: RUL, RML, RLL, LUL, LLL and Bronchi.   ETT obstructed with dried secretions. Bronchoscope removed.  , Patient placed back on 100% FiO2 at conclusion of procedure.    Evaluation Hemodynamic Status: BP stable throughout; O2 sats: stable throughout Patient's Current Condition: stable Specimens:  None Complications: No apparent complications Patient did tolerate procedure well.   YACOUB,WESAM 08/21/2015

## 2015-08-21 NOTE — Progress Notes (Signed)
   08/21/15 1618  Clinical Encounter Type  Visited With Patient and family together;Health care provider  Visit Type Initial;Spiritual support;Patient actively dying  Referral From [facility] responded to an end of life consult. Chaplain met with patient and her daughter, and offered prayer. Chaplain support available as needed.   Jeri Lager, Chaplain 08/21/2015 4:21 PM

## 2015-08-21 NOTE — Progress Notes (Signed)
STROKE TEAM PROGRESS NOTE   SUBJECTIVE (INTERVAL HISTORY) Her RN , husband and   sister are at the bedside. She has no significant change since yesterday. Still opens eyes but not following commands.  She has not shown any improvement after her third hemodialysis session.She had resp distress and Dr Nelda Marseille is evaluationg her for possible bronchoscopy  OBJECTIVE Temp:  [98.3 F (36.8 C)-99.7 F (37.6 C)] 99.7 F (37.6 C) (12/14 1152) Pulse Rate:  [42-149] 149 (12/14 1300) Cardiac Rhythm:  [-] Atrial fibrillation (12/14 1246) Resp:  [19-26] 22 (12/14 1300) BP: (110-181)/(38-73) 124/56 mmHg (12/14 1300) SpO2:  [99 %-100 %] 99 % (12/14 1331) FiO2 (%):  [40 %] 40 % (12/14 1331) Weight:  [243 lb 2.7 oz (110.3 kg)] 243 lb 2.7 oz (110.3 kg) (12/14 0406)  CBC:   Recent Labs Lab 08/18/15 1024 08/19/15 0455 08/20/15 0448 08/21/15 0410  WBC 10.3 11.4* 11.2* 12.1*  NEUTROABS 8.5* 9.4*  --   --   HGB 8.9* 9.3* 8.3* 7.7*  HCT 30.7* 31.3* 27.9* 26.3*  MCV 90.8 89.7 90.0 90.4  PLT 222 252 237 [id number]    Basic Metabolic Panel:   Recent Labs Lab 08/20/15 0448 08/21/15 0410  NA 139 140  K 4.0 4.2  CL 98* 99*  CO2 28 26  GLUCOSE 215* 194*  BUN 80* 128*  CREATININE 3.62* 4.70*  CALCIUM 8.5* 8.4*  MG  --  2.1  PHOS 6.3* 8.9*    Lipid Panel:     Component Value Date/Time   CHOL 127 08/13/2015 0238   TRIG 191* 08/13/2015 0238   TRIG 195* 08/13/2015 0238   HDL 28* 08/13/2015 0238   CHOLHDL 4.5 08/13/2015 0238   VLDL 39 08/13/2015 0238   LDLCALC 60 08/13/2015 0238   HgbA1c:  Lab Results  Component Value Date   HGBA1C 7.4* 08/12/2015   Urine Drug Screen:     Component Value Date/Time   LABOPIA POSITIVE* 08/12/2015 1430   COCAINSCRNUR NONE DETECTED 08/12/2015 1430   LABBENZ POSITIVE* 08/12/2015 1430   AMPHETMU NONE DETECTED 08/12/2015 1430   THCU NONE DETECTED 08/12/2015 1430   LABBARB NONE DETECTED 08/12/2015 1430      IMAGING I have personally reviewed the  radiological images below and agree with the radiology interpretations.  CT HEAD 08/12/2015   Acute moderate RIGHT parietal occipital infarct (watershed versus angular branch of the MCA). Small acute RIGHT cerebellar infarct. Small area RIGHT frontal encephalomalacia may be posttraumatic or ischemic.   US Abdomen Complete 08/18/2015  1. Status post cholecystectomy. No acute abnormality seen within the abdomen. 2. Kidneys unremarkable in appearance. 3. Scattered calcific atherosclerotic disease along the abdominal aorta.     MRI and MRA HEAD 08/12/2015  Exam is motion degraded. Numerous bilateral acute/subacute nonhemorrhagic infarcts. Most confluent infarcts have an appearance of acute/subacute infarct involves portions of the right frontal -parietal - occipital and posterior right temporal lobe. Swelling of gyri without significant local mass effect. Smaller acute nonhemorrhagic infarcts scattered throughout the hemispheres bilaterally (frontal lobes, parietal lobes, occipital lobes), right temporal lobe/subinsular region and cerebellum bilaterally. The involvement of multiple vascular distributions raises possibility of embolic disease. Remote small right cerebellar infarct. Exam is slightly motion degraded. Mild irregularity and slight narrowing cavernous segment right internal carotid artery. Small bulge may be related to atherosclerotic type changes rather than aneurysm. No significant stenosis of either carotid terminus or M1 segment of either middle cerebral artery. Middle cerebral artery branch vessel irregularity and narrowing bilaterally. Mild narrowing and  irregularity A1 segment right anterior cerebral artery. Bulge of the distal A1 segment of the right anterior cerebral artery appears to be origin of a vessel rather than saccular aneurysm. Mild to moderate narrowing A2 segment right anterior cerebral artery. Left vertebral artery is dominant. Mild to moderate narrowing distal right vertebral  artery. Mild to moderate narrowing portions of the posterior inferior cerebellar artery bilaterally. Mild to moderate narrowing portions of the anterior inferior cerebellar artery bilaterally. Narrowing of the superior cerebellar artery more notable on the left. Mild to moderate narrowing portions of the proximal, mid and distal aspect of the posterior cerebral artery bilaterally. Tiny bulge superior margin P1 segments left posterior cerebral artery. Question tiny aneurysm (less than 2 mm). A vessel may rise from this region.   Mr Brain Wo Contrast  08/15/2015  IMPRESSION: Multiple areas of acute infarction are unchanged from 08/12/2015. Negative for hemorrhage. Findings suggest cerebral emboli.  14:08   CUS - Bilateral: 1-39% ICA stenosis. Vertebral artery flow is antegrade.  2D echo - Normal LV size with EF 50%, diffuse hypokinesis. Mildly dilated RV with normal systolic function. Biatrial enlargement. Moderate aortic stenosis. Dilated IVC suggests elevated RV filling pressure. Mild pulmonary hypertension.  Mr Lumbar Spine Wo Contrast  08/12/2015  IMPRESSION: No complications seen following unsuccessful lumbar puncture. Mild multifactorial spinal stenosis at L2-3 and L3-4 could have contributed to the difficulty at at lumbar puncture. Degenerative facet arthropathy at L3-4 and L4-5. Should a lumbar puncture be repeated, greater likelihood of success might be obtained at the L4-5 or L5-S1 level.  Dg Chest Port 1 View 08/16/2015  IMPRESSION: Allowing for differences in patient positioning there has been overall improvement in the appearance of the pulmonary interstitium especially on the right. There is persistent left lower lobe atelectasis or pneumonia with small left pleural effusion. Stable cardiomegaly. The support tubes are in reasonable position.   08/13/2015  IMPRESSION: 1. Persistent left lower lobe atelectasis and small left pleural effusion. Stable mild low-grade CHF. The  endotracheal tube is in reasonable position. 2. The nasogastric tube tip and proximal port lie in the region of the gastric body below the expected location of the GE junction.   EEG - This EEG is abnormal with severe generalized continuous nonspecific slowing cerebral activity. This pattern of slowing can be seen with metabolic and toxic encephalopathies, as well as with severe degenerative central nervous system disorders. No evidence of epileptiform activity was recorded.   Repeat EEG  08/16/2015 Impression: This EEG is abnormal due to the presence of: 1. Moderate diffuse slowing of the background 2. Occasional broad sharp waves over the right frontotemporal and right frontal regions   Portable chest x-ray 1 view 08/18/2015 1. Support apparatus satisfactory. 2. Improved aeration in the lung bases since yesterday, with only mild atelectasis persisting at the right base. 3.No acute cardiopulmonary disease otherwise.   Physical exam  Temp:  [98.3 F (36.8 C)-99.7 F (37.6 C)] 99.7 F (37.6 C) (12/14 1152) Pulse Rate:  [42-149] 149 (12/14 1300) Resp:  [19-26] 22 (12/14 1300) BP: (110-181)/(38-73) 124/56 mmHg (12/14 1300) SpO2:  [99 %-100 %] 99 % (12/14 1331) FiO2 (%):  [40 %] 40 % (12/14 1331) Weight:  [243 lb 2.7 oz (110.3 kg)] 243 lb 2.7 oz (110.3 kg) (12/14 0406)  General - Well nourished, well developed Caucasian lady intubated HEENT: NCAT; PERRL; sclera slightly injected; intubated Cardiovascular - irregular rhythm and rate. Pulmonary: CTA Abdomen: soft: ND Extremities: ankle edema  Neuro Mental Status:  Opens eyes  spontaneously; does not follow commands.  Cranial Nerves PERRL; no blink to threat; OCRs intact; eye closure grossly symmetric.  Spontaneous cough; breathing over the vent  Motor/Sensory: Left UE with extn to noxious stimuli; right upper extremity with w/d to noxious stimuli; right LE slight w/d and left LE no response to noxious  stimuli  Coordination/Gait: Deferred due to LOC    ASSESSMENT/PLAN Ms. Melissa Becker is a 69 y.o. female with history of DM, HTN, CAD, atrial fibrillation, TIA, GI bleed presenting with complaints of a HA who fell at home due to weakness who developed seizures followed by v fib/cardiac arrest at an outlying hospital. She was intubated and transferred to done where she was found to have a R parietal occipital infarct and small R cerebellar infarct. She did not receive IV t-PA due to unknown time last known well.   Stroke:  bilateral anterior and posterior, infra and supratentorial infarcts, embolic secondary to known atrial fibrillation not on AC  Resultant  AMS, seizure and BUE plegia,  Multifactorial mental status change likely from sepsis, uremia, strokes  MRI  bilateral anterior and posterior, infra and supratentorial infarcts, largest at right temporal lobe at MCA distribution  MRA diffuse intracranial atherosclerosis, ? Tiny L PCA aneurysm  Repeat MRI brain 08/15/15 no change  Carotid Doppler  Unremarkable   Ammonia level 22 (WNL) on 08/17/2015  2D Echo  EF 50%  LDL 60  HgbA1c 7.4  Heparin 5000 units sq tid for VTE prophylaxis Diet NPO time specified  No antithrombotic prior to admission, now on ASA 325mg  for stroke prevention. No AC at this time due to risk of hemorrhagic transformation in the setting of large infarcts at right temporal lobe. Will consider AC in 5-7 days post stroke and if no planned procedures.   Ongoing aggressive stroke risk factor management  Therapy recommendations:  pending  Disposition:  pending  Seizure  Likely secondary to cortical infarcts  EEG no seizure  Repeat EEG today on 08/16/2015 did not show seizure   Dilantin level daily 08/18/2015 - <2.5; corrected level 7 or 8  Discussed with pharmacist and Dr Belenda Cruise - increase dose to 200 mg Q12 hours.    free level in AM today pending.May need to give additional dose after dialysis if  level is hard to maintain  [place] encephalopathy  Pt still open eyes but not tracking and not moving LEs as before  MRI no acute changes comparing with initial MRI  LP not supportive for CNS infection   Neuro change likely due to uremic encephalopathy  Ammonia level 22 (WNL) on 08/17/2015  AKI and uremia  CK 134  Cre 3.91 -> 4.26 -> 5.44-> 6.06-> 6.71->7.21-->5.2 -> 4.65  BUN 91 Sunday  Nephrology on board  On HD   ? Sepsis  procalcitonin 19.06 -> 16.71->12.60  afebrile  Temp 100.8  today (08/18/2015) CBC with diff. Pending  Portable chest x-ray today - improved (see above)  CSF cultures no growth so far  Check urine  Off antibiotics  CSF not supportive for CNS infection  Pt afebrile, WBC normolized  Vent dependent respiratory failure  Intubated  CCM managing  Likely need trach soon  Atrial Fibrillation  Home anticoagulation:  No antithrombotics at home  CHA2DS2-VASc Score = 6, ?2 oral anticoagulation recommended  Age in Years:  1-74   +1   Sex:  Female   +1    Hypertension History: yes   +1     Diabetes Mellitus:  yes   +  1   Congestive Heart Failure History:  0  Vascular Disease History:  0     Stroke/TIA/Thromboembolism History: yes   +2  On ASA for now. Hold off AC for 7 days to avoid hemorrhagic transformation and if no planned procedures.  Would reconsider on Monday.  Possibly repeat CT scan then to ensure no medical contraindication to [facility]    Hypertension  Still elevated likely due to AKI  BP goal normotensive.  Hyperlipidemia  Home meds:  lipitor 80 resumed in hospital  LDL 60, goal < 70  Continue statin at discharge  Diabetes type II  HgbA1c 7.4, goal < 7.0  Not controlled  SSI  Other Stroke Risk Factors  Advanced age  Former Cigarette smoker, quit smoking 22 years ago   Morbid Obesity, Body mass index is 39.27 kg/(m^2).   Hx stroke/TIA  Coronary artery disease  Aortic stenosis  Anemia H/H  10.0 / 32.5    Other Active Problems  Hypothyroid  Osteomyelitis R foot  Hospital day # 10   Patient was seen and examined by me personally. The laboratory and radiographic studies reviewed by me. I had a long discussion with the patient's  Husband and sister  regarding her prognosis which appears quite poor.They understand this and  agree she would not want prolonged ventilatory support, tracheostomy or PEG tube. They are ready to withdrawal of care soon. This patient is critically ill and at significant risk of neurological worsening, death and care requires constant monitoring of vital signs, hemodynamics,respiratory and cardiac monitoring, extensive review of multiple databases, frequent neurological assessment, discussion with family, other specialists and medical decision making of high complexity.I have made any additions or clarifications directly to the above note.This critical care time does not reflect procedure time, or teaching time or supervisory time of PA/NP/Med Resident etc but could involve care discussion time.  I spent 30 minutes of neurocritical care time  in the care of  this patient.       SIGNED BY: Antony Contras, MD   To contact Stroke Continuity provider, please refer to [email or web address] After hours, contact General Neurology

## 2015-08-21 NOTE — Progress Notes (Signed)
Approximately 1130 pt appearing to have seizure activity.  Pt asynchronous with vent, difficult to ventilate, desaturating,  requiring bag ventilation.  Dr. Nelda Marseille called to bedside, verified good ETT placement by bronchoscope, and ordered 20mg  Etomidate, 50mg  Rocuronium IVP.  Pt returned to [facility], tolerating well with vent synchrony.  Will continue to monitor.

## 2015-08-21 NOTE — Progress Notes (Signed)
Comfort care measures implemented per order at 1538 with family at bedside.  Will continue to monitor.

## 2015-08-21 NOTE — Care Management Note (Signed)
Case Management Note  Patient Details  Name: Melissa Becker MRN: [id number] Date of Birth: 31-Aug-1946  Subjective/Objective:       Plan for withdrawal of care -              Action/Plan:   Expected Discharge Date:                  Expected Discharge Plan:  [place]  In-House Referral:  Clinical Social Work  Discharge planning Services  CM Consult  Post Acute Care Choice:    Choice offered to:     DME Arranged:    DME Agency:     HH Arranged:    [place] Agency:     Status of Service:  In process, will continue to follow  Medicare Important Message Given:    Date Medicare IM Given:    Medicare IM give by:    Date Additional Medicare IM Given:    Additional Medicare Important Message give by:     If discussed at [place] of Stay Meetings, dates discussed:    Additional Comments:  Vergie Living, RN 08/21/2015, 2:10 PM

## 2015-08-21 NOTE — Progress Notes (Signed)
PULMONARY / CRITICAL CARE MEDICINE   Name: Melissa Becker MRN: [id number] DOB: 07/22/1946    ADMISSION DATE:  08/24/2015  REFERRING MD:  Anselmo Pickler, D.O. [facility])  HISTORY OF PRESENT ILLNESS:  69 year old Caucasian female with known history of diabetes mellitus as well as coronary artery disease. Presented to outside hospital on 12/3 with a two-week history of a "headache" per her husband today. Patient is currently intubated and history obtained from the patient's family as well as the electronic medical record. Patient was taking Advil for her chronic and severe headache without relief. At approximately 1 AM on 12/3 she had an unwitnessed fall in their bathroom and was found conscious by her husband. He reports she did have a rightward gaze at the time with questionable left-sided weakness. EMS assisted her in returning to her bed. The patient attempted to get up again with the assistance of her husband and fell due to weakness. At that time she was transported outside hospital. The patient's husband reports she's had no sick contacts or consumed any raw food. At outside hospital where she underwent CT imaging of the brain and while returning to the emergency department had an apparent seizure followed by a second seizure and ultimately went into ventricular fibrillation with cardiac arrest requiring defibrillation 3 times. At that time patient was on dopamine for shock and was given 1 dose of vancomycin as well as Zosyn. The patient was ultimately intubated at outside hospital. Overnight the patient was weaned off of vasopressor infusion but had questionable episodes of seizure-like activity. Patient was given an IV load of Dilantin followed by a Dilantin drip. The patient was noted to progress to oliguric renal failure just prior to transfer. Patient did reportedly have a fever to 102F at outside hospital as well.  SUBJECTIVE:  On full support   VITAL SIGNS: BP 148/52 mmHg   Pulse 59  Temp(Src) 99.3 F (37.4 C) (Oral)  Resp 26  Ht 5\' 6"  (1.676 m)  Wt 243 lb 2.7 oz (110.3 kg)  BMI 39.27 kg/m2  SpO2 100%  HEMODYNAMICS:    VENTILATOR SETTINGS: Vent Mode:  [-] PRVC FiO2 (%):  [40 %] 40 % Set Rate:  [16 bmp] 16 bmp Vt Set:  [550 mL] 550 mL PEEP:  [5 cmH20] 5 cmH20 Plateau Pressure:  [17 cmH20-36 cmH20] 36 cmH20  INTAKE / OUTPUT: I/O last 3 completed shifts: In: 2474 [I.V.:350; NG/GT:1820; IV Piggyback:304] Out: 605 [Urine:605]  PHYSICAL EXAMINATION:  Gen: ill appearing HEENT: Pupils reactive, ETT in place PULM: +wheeze, overbreathes vent CV: Irreg irreg rhythm, normal rate, systolic murmur GI: BS+, soft, nontender Derm: no cyanosis or rash Neuro: opens eyes with stimulation, moves Rt leg, nsc  LABS; CBC Recent Labs     08/19/15  0455  08/20/15  0448  08/21/15  0410  WBC  11.4*  11.2*  12.1*  HGB  9.3*  8.3*  7.7*  HCT  31.3*  27.9*  26.3*  PLT  252  237  229   Coag's Recent Labs     08/21/15  0410  APTT  25  INR  1.22    BMET Recent Labs     08/19/15  0455  08/20/15  0448  08/21/15  0410  NA  142  139  140  K  4.1  4.0  4.2  CL  100*  98*  99*  CO2  25  28  26   BUN  134*  80*  128*  CREATININE  5.40*  3.62*  4.70*  GLUCOSE  280*  215*  194*   Electrolytes Recent Labs     08/19/15  0455  08/20/15  0448  08/21/15  0410  CALCIUM  8.9  8.5*  8.4*  MG   --    --   2.1  PHOS  7.4*  6.3*  8.9*   Sepsis Markers No results for input(s): PROCALCITON, O2SATVEN in the last 72 hours.  Invalid input(s): LACTICACIDVEN  ABG Recent Labs     08/21/15  0421  PHART  7.443  PCO2ART  36.1  PO2ART  95.8    Liver Enzymes Recent Labs     08/19/15  0455  08/20/15  0448  ALBUMIN  2.4*  2.2*   Cardiac Enzymes No results for input(s): TROPONINI, PROBNP in the last 72 hours.  Glucose Recent Labs     08/20/15  0821  08/20/15  1202  08/20/15  1534  08/20/15  2010  08/21/15  0007  08/21/15  0809  GLUCAP  220*   235*  198*  156*  169*  218*   Imaging Dg Chest Port 1 View  08/21/2015  CLINICAL DATA:  Sepsis, thrombotic CVA, meningitis, respiratory failure, coronary artery disease EXAM: PORTABLE CHEST 1 VIEW COMPARISON:  Portable chest x-ray of August 20, 2015 FINDINGS: The lungs are adequately inflated. The cardiopericardial silhouette remains enlarged. There is new obscuration of the left hemidiaphragm. The pulmonary interstitial markings are slightly increased bilaterally. The central pulmonary vascularity is engorged. The endotracheal tube tip lies 3.4 cm above the carina. The esophagogastric tube tip projects below the inferior margin of the image. The right internal jugular venous catheter tip projects over the proximal portion of the SVC. IMPRESSION: Mild CHF with interstitial edema and new small left pleural effusion. Stable mild subsegmental atelectasis in the right infrahilar region. Electronically Signed   By: David  Martinique M.D.   On: 08/21/2015 07:23   Dg Chest Port 1 View  08/20/2015  CLINICAL DATA:  Respiratory failure, acute CVA, acute renal insufficiency, sepsis. EXAM: PORTABLE CHEST 1 VIEW COMPARISON:  Portable chest x-ray of August 19, 2015 FINDINGS: The lungs are adequately inflated. There are persistent coarse lung markings in the infrahilar regions bilaterally. The hemidiaphragms are well demonstrated. There is no significant pleural effusion and there is no pneumothorax. The cardiac silhouette remains enlarged. The central pulmonary vascularity remains mildly engorged and the pulmonary interstitial markings are slightly more conspicuous overall. The endotracheal tube tip projects 6 cm above the carina. The esophagogastric tube's proximal port is at the GE junction with the tip in the proximal gastric body. The right internal jugular venous catheter tip projects over the proximal SVC. IMPRESSION: Slight interval increase conspicuity of atelectasis or infiltrate in the lower lobes. Slight  interval increase in conspicuity of the pulmonary interstitium which may reflect low-grade interstitial edema. Stable cardiomegaly. Advancement of the nasogastric tube by 5-10 cm is recommended to assure that the proximal port is below the GE junction. Electronically Signed   By: David  Martinique M.D.   On: 08/20/2015 07:30    STUDIES:  12/03 CT head [facility]) >> atrophy 12/04 CT head >> acute Rt parietal occipital infarct, Rt cerebellar infarct 12/04 U/S Abd >> unremarkable appearance of kidneys 12/05 MRI/MRA brain >> numerous b/l infarcts concerning for embolic infarcts [id number] Echo >> EF 50%, diffuse hypokinesis, grade 1 diastolic dysfx, mod AS, mod LA dilation 12/05 EEG >> generalized slowing 12/05 MRI lumbar spine >> L2-3, L3-4 spinal stenosis, degenerative arthropathy L3-4, L4-5  12/8 MRI brain >> no change in acute infarcts  12/8 LP > only 4 WBC, 30 RBC, glucose 162, Protein normal 12/8 EEG > generalized slowing, occassional broad spike  CULTURES: 12/04 Blood >> negative 12/04 Respiratory viral panel >> negative 12/04 Pneumococcal Ag >> negative 12/05 CSF >> Negative 12/06 Legionella Ag >> negative 12/8 CSF >> neg 12/8 CSF HSV PCR > negative  ANTIBIOTICS: 12/04 Tressie Ellis >> 12/8 12/04 Ampicillin >> 12/07 12/04 Vancomycin >>  12/8 12/06 Diflucan >>12/8 12/10 Doxycycline >>   SIGNIFICANT EVENTS: 12/03 Admit to OSH w/ Seizure & Fall 12/04 Transfer to [facility]; weaned off pressors; neurology consulted 12/05 Difficulty with LP due to spinal stenosis >> only cultures sent; add HCO3 to IV fluid 12/06 Nephrology consulted; d/c HCO3 from IV fluid 12/9 Start HD>>12/13 stopped  LINES/TUBES: 12/03 ETT ([facility]) >> 12/9 R IJ HD cath >   DISCUSSION: 69 yo female presented to [facility] with 2 weeks of HA, and had developed fever up to 102F.  She had fall at home with Rtward gaze and Lt sided weakness.  She is reported to have seizure at [facility].  There is also report of ventricular  fibrillation at [facility] >> ?if this was ECG artifact in setting of seizure.  She has hx of DM, HTN, TIA, Hypothyroidism, Neuropathy, Aortic stenosis, CAD, GERD, A fib, Rt foot osteomyelitis.  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 Hx of DM neuropathy  P:   RASS goal 0 AED's per neurology  Hold outpt cymbalta Re-assess mental status after further HD, not improved 12/13, needs EOL discussion.  PULMONARY A: Acute hypoxic respiratory failure and concern for pneumonia > improved but mental status limits extubation. Interstitial edema with pleural effusions > improved P:   PSV as tolerated. F/u CXR. No weaning given mental status. Need to discuss trach/peg vs terminal extubation.  CARDIOVASCULAR A:  Hx of A fib >> not on anticoagulation as outpt. Hx of HTN, CAD, HLD, mod aortic stenosis. VF arrest at outside hospital >> happened with seizure, might have been ECG artifact in setting of seizure. Elevated troponin >> demand ischemia. Intermittent bradycardia. Hypertension. P:  Tele. Continue metoprolol, hydralazine, norvasc, lipitor, ASA. Defer cardiology assessment until mental status improved, not improving. Hold outpt cozaar. Plan to start anticoagulation around day 7 after stroke (08/19/15) > defer to neurology, hold 12/14 with dropping h/h.  RENAL Lab Results  Component Value Date   CREATININE 4.70* 08/21/2015   CREATININE 3.62* 08/20/2015   CREATININE 5.40* 08/19/2015   A:   AKI with oliguria >> baseline creatinine 0.8 from August 2014. P:   HD per renal, no improvement in mental status despite HD, No further HD per renal service at this time  GASTROINTESTINAL A:   Nutrition. P:   Tube feeds Protonix for SUP  HEMATOLOGIC  Recent Labs  08/20/15 0448 08/21/15 0410  HGB 8.3* 7.7*   A:   Anemia of critical illness and chronic disease. P:  F/u CBC SCD's, SQ  heparin Monitor for bleeding  INFECTIOUS A:   Septic shock >> likely from pneumonia with possible viral prodrome> resolved. Chronic Rt foot osteomyelitis. Yeast UTI. P:   Continue home doxycycline for osteomyelitis  ENDOCRINE CBG (last 3)   Recent Labs  08/20/15 2010 08/21/15 0007 08/21/15 0809  GLUCAP 156* 169* 218*   A:   Hx of DM, hypothyroidism Worsening hyperglycemia P:   SSI with lantus(12/14 lantus increased to 65) Hold  outpt glimepiride, metformin Continue synthroid  Goals of Care >> mental not improving 12/14 despite HD. Renal not supportive of further dialysis. ? Family discussion on goals of care.  Richardson Landry Minor ACNP Mary[facility] Shape PCCM Pager [phone] till 3 pm If no answer page [phone] 08/21/2015, 9:56 AM  Attending Note:  69 year old female remaining unresponsive, discussed with neuro, no reasonable chance at recovery.  Spoke with husband and family at length, after discussion, decision was made to make patient full DNR and withdrawal the ventilator after morphine has been started.  I was called bedside because patient desaturated and became agonal.  Bronch done and secretions removed.  Patient was auto-PEEPing.  Will proceed with withdrawal.  The patient is critically ill with multiple organ systems failure and requires high complexity decision making for assessment and support, frequent evaluation and titration of therapies, application of advanced monitoring technologies and extensive interpretation of multiple databases.   Critical Care Time devoted to patient care services described in this note is  35  Minutes. This time reflects time of care of this signee Dr Jennet Maduro. This critical care time does not reflect procedure time, or teaching time or supervisory time of PA/NP/Med student/Med Resident etc but could involve care discussion time.  Rush Farmer, M.D. [facility] Pulmonary/Critical Care Medicine. Pager: [phone]. After hours pager: [phone].

## 2015-08-21 NOTE — Procedures (Signed)
Extubation Procedure Note  Patient Details:   Name: Melissa Becker DOB: [birth year] MRN: [id number]   Airway Documentation:  Airway 7.5 mm (Active)  Secured at (cm) 26 cm 08/21/2015 11:16 AM  Measured From Lips 08/21/2015 11:16 AM  Secured Location Right 08/21/2015 11:16 AM  Secured By [employer] 08/21/2015 11:16 AM  Tube Holder Repositioned Yes 08/21/2015 11:16 AM  Cuff Pressure (cm H2O) 28 cm H2O 08/21/2015 12:15 AM  Site Condition Dry 08/21/2015 11:16 AM    Evaluation  O2 sats: stable throughout Complications: No apparent complications Patient did tolerate procedure well. Bilateral Breath Sounds: Rhonchi Suctioning: Airway Yes   Pt. Was terminally extubated to a 3L [place] with RN & daughter at the bedside without any complications.  Claretta Fraise 08/21/2015, 4:23, PM

## 2015-08-21 NOTE — Progress Notes (Signed)
RT note- placed back to full support for 2D echo.

## 2015-08-21 NOTE — Progress Notes (Signed)
Pt agitated with increased WOB post SBT attempt, currently on PRVC.  Pt's sedation remains lifted for ongoing neuro assessments.

## 2015-08-21 NOTE — Progress Notes (Signed)
Pt extubated approximately 1620 per comfort care order by Willey Blade, RRT to 3lpm [place].  Daughter remains at bedside, will continue to monitor.

## 2015-08-21 NOTE — Progress Notes (Signed)
Family's decision to transition to comfort measures noted.  Will sign off.  Please call with any questions or concerns.

## 2015-08-22 ENCOUNTER — Encounter (HOSPITAL_COMMUNITY): Payer: Self-pay | Admitting: *Deleted

## 2015-08-22 DIAGNOSIS — Z515 Encounter for palliative care: Secondary | ICD-10-CM

## 2015-08-23 ENCOUNTER — Telehealth: Payer: Self-pay

## 2015-08-23 NOTE — Telephone Encounter (Signed)
On 08/23/2015 I received a death certificate from [place] (faxed). The death certificate is for cremation. The patient is a patient of Doctor Nelda Marseille. The death certificate will be taken to [facility]) this pm for signature. On September 11, 2015 I received the death certificate back from Doctor Nelda Marseille. I got the death certificate ready and faxed the death certificate over to the funeral home per their request.

## 2015-08-27 ENCOUNTER — Telehealth: Payer: Self-pay

## 2015-08-27 NOTE — Telephone Encounter (Signed)
On 08/26/2015 I received a death certificate from [place] (original). The death certificate is for cremation. The patient is a patient of Doctor Nelda Marseille.  The death certificate will be taken to [facility]) this am for signature. On September 16, 2015 I received the death certificate back from Doctor Nelda Marseille. I got the death certificate ready and called the funeral home to let them know the death certificate is ready for pickup.

## 2015-09-08 NOTE — Progress Notes (Signed)
PULMONARY / CRITICAL CARE MEDICINE   Name: Melissa Becker MRN: [id number] DOB: 07-16-1946    ADMISSION DATE:  08/25/2015  REFERRING MD:  Anselmo Pickler, D.O. [facility])  HISTORY OF PRESENT ILLNESS:  70 year old Caucasian female with known history of diabetes mellitus as well as coronary artery disease. Presented to outside hospital on 12/3 with a two-week history of a "headache" per her husband today. Patient is currently intubated and history obtained from the patient's family as well as the electronic medical record. Patient was taking Advil for her chronic and severe headache without relief. At approximately 1 AM on 12/3 she had an unwitnessed fall in their bathroom and was found conscious by her husband. He reports she did have a rightward gaze at the time with questionable left-sided weakness. EMS assisted her in returning to her bed. The patient attempted to get up again with the assistance of her husband and fell due to weakness. At that time she was transported outside hospital. The patient's husband reports she's had no sick contacts or consumed any raw food. At outside hospital where she underwent CT imaging of the brain and while returning to the emergency department had an apparent seizure followed by a second seizure and ultimately went into ventricular fibrillation with cardiac arrest requiring defibrillation 3 times. At that time patient was on dopamine for shock and was given 1 dose of vancomycin as well as Zosyn. The patient was ultimately intubated at outside hospital. Overnight the patient was weaned off of vasopressor infusion but had questionable episodes of seizure-like activity. Patient was given an IV load of Dilantin followed by a Dilantin drip. The patient was noted to progress to oliguric renal failure just prior to transfer. Patient did reportedly have a fever to 102F at outside hospital as well.  SUBJECTIVE:  extubated  VITAL SIGNS: BP 147/43 mmHg  Pulse 49   Temp(Src) 99.7 F (37.6 C) (Oral)  Resp 10  Ht 5\' 6"  (1.676 m)  Wt 243 lb 2.7 oz (110.3 kg)  BMI 39.27 kg/m2  SpO2 89%  HEMODYNAMICS:    VENTILATOR SETTINGS: Vent Mode:  [-] PRVC FiO2 (%):  [40 %] 40 % Set Rate:  [16 bmp] 16 bmp Vt Set:  [550 mL] 550 mL PEEP:  [5 cmH20] 5 cmH20 Plateau Pressure:  [19 cmH20] 19 cmH20  INTAKE / OUTPUT: I/O last 3 completed shifts: In: 1406.5 [I.V.:317.5; NG/GT:885; IV Piggyback:204] Out: 675 [Urine:675]  PHYSICAL EXAMINATION:  Gen: ill appearing HEENT: Pupils reactive, extubated PULM: agonal breathing pattern  CV: Irreg irreg rhythm, normal rate, systolic murmur GI: BS+, soft, nontender Derm: no cyanosis or rash Neuro: opens eyes with stimulation, sedated with mso4  LABS; CBC Recent Labs     08/20/15  0448  08/21/15  0410  WBC  11.2*  12.1*  HGB  8.3*  7.7*  HCT  27.9*  26.3*  PLT  237  229   Coag's Recent Labs     08/21/15  0410  APTT  25  INR  1.22    BMET Recent Labs     08/20/15  0448  08/21/15  0410  NA  139  140  K  4.0  4.2  CL  98*  99*  CO2  28  26  BUN  80*  128*  CREATININE  3.62*  4.70*  GLUCOSE  215*  194*   Electrolytes Recent Labs     08/20/15  0448  08/21/15  0410  CALCIUM  8.5*  8.4*  MG   --  2.1  PHOS  6.3*  8.9*   Sepsis Markers No results for input(s): PROCALCITON, O2SATVEN in the last 72 hours.  Invalid input(s): LACTICACIDVEN  ABG Recent Labs     08/21/15  0421  PHART  7.443  PCO2ART  36.1  PO2ART  95.8    Liver Enzymes Recent Labs     08/20/15  0448  ALBUMIN  2.2*   Cardiac Enzymes No results for input(s): TROPONINI, PROBNP in the last 72 hours.  Glucose Recent Labs     08/20/15  1534  08/20/15  2010  08/21/15  0007  08/21/15  0331  08/21/15  0809  08/21/15  1147  GLUCAP  198*  156*  169*  170*  218*  347*   Imaging Dg Chest Port 1 View  08/21/2015  CLINICAL DATA:  Sepsis, thrombotic CVA, meningitis, respiratory failure, coronary artery disease EXAM:  PORTABLE CHEST 1 VIEW COMPARISON:  Portable chest x-ray of August 20, 2015 FINDINGS: The lungs are adequately inflated. The cardiopericardial silhouette remains enlarged. There is new obscuration of the left hemidiaphragm. The pulmonary interstitial markings are slightly increased bilaterally. The central pulmonary vascularity is engorged. The endotracheal tube tip lies 3.4 cm above the carina. The esophagogastric tube tip projects below the inferior margin of the image. The right internal jugular venous catheter tip projects over the proximal portion of the SVC. IMPRESSION: Mild CHF with interstitial edema and new small left pleural effusion. Stable mild subsegmental atelectasis in the right infrahilar region. Electronically Signed   By: David  Martinique M.D.   On: 08/21/2015 07:23    STUDIES:  12/03 CT head [facility]) >> atrophy 12/04 CT head >> acute Rt parietal occipital infarct, Rt cerebellar infarct 12/04 U/S Abd >> unremarkable appearance of kidneys 12/05 MRI/MRA brain >> numerous b/l infarcts concerning for embolic infarcts [id number] Echo >> EF 50%, diffuse hypokinesis, grade 1 diastolic dysfx, mod AS, mod LA dilation 12/05 EEG >> generalized slowing 12/05 MRI lumbar spine >> L2-3, L3-4 spinal stenosis, degenerative arthropathy L3-4, L4-5 12/8 MRI brain >> no change in acute infarcts  12/8 LP > only 4 WBC, 30 RBC, glucose 162, Protein normal 12/8 EEG > generalized slowing, occassional broad spike  CULTURES: 12/04 Blood >> negative 12/04 Respiratory viral panel >> negative 12/04 Pneumococcal Ag >> negative 12/05 CSF >> Negative 12/06 Legionella Ag >> negative 12/8 CSF >> neg 12/8 CSF HSV PCR > negative  ANTIBIOTICS: 12/04 Tressie Ellis >> 12/8 12/04 Ampicillin >> 12/07 12/04 Vancomycin >>  12/8 12/06 Diflucan >>12/8 12/10 Doxycycline >> 12/14  SIGNIFICANT EVENTS: 12/03 Admit to OSH w/ Seizure & Fall 12/04 Transfer to [facility]; weaned off pressors; neurology consulted 12/05 Difficulty with LP  due to spinal stenosis >> only cultures sent; add HCO3 to IV fluid 12/06 Nephrology consulted; d/c HCO3 from IV fluid 12/9 Start HD>>12/13 stopped 12/14 DNR with terminal extubation  LINES/TUBES: 12/03 ETT ([facility]) >> 12/9 R IJ HD cath >   DISCUSSION: 70 yo female presented to [facility] with 2 weeks of HA, and had developed fever up to 102F.  She had fall at home with Rtward gaze and Lt sided weakness.  She is reported to have seizure at [facility].  There is also report of ventricular fibrillation at [facility] >> ?if this was ECG artifact in setting of seizure.  She has hx of DM, HTN, TIA, Hypothyroidism, Neuropathy, Aortic stenosis, CAD, GERD, A fib, Rt foot osteomyelitis.  ASSESSMENT / PLAN:  NEUROLOGIC A:   Acute encephalopathy with status epilepticus likely from acute  embolic infarcts, and uremia. Hx of DM neuropathy  P:   Terminal care  PULMONARY A: Acute hypoxic respiratory failure and concern for pneumonia > improved but mental status limits extubation. Interstitial edema with pleural effusions > improved P:   Terminal care  CARDIOVASCULAR A:  Hx of A fib >> not on anticoagulation as outpt. Hx of HTN, CAD, HLD, mod aortic stenosis. VF arrest at outside hospital >> happened with seizure, might have been ECG artifact in setting of seizure. Elevated troponin >> demand ischemia. Intermittent bradycardia. Hypertension. P:  Terminal care  RENAL A:   AKI with oliguria >> baseline creatinine 0.8 from August 2014. P:   Terminal care  GASTROINTESTINAL A:   Nutrition. P:   Terminal care  HEMATOLOGIC  Recent Labs  08/20/15 0448 08/21/15 0410  HGB 8.3* 7.7*   A:   Anemia of critical illness and chronic disease. P:  Terminal care  INFECTIOUS A:   Septic shock >> likely from pneumonia with possible viral prodrome> resolved. Chronic Rt foot osteomyelitis. Yeast UTI. P:   Terminal care  ENDOCRINE  A:   Hx of DM, hypothyroidism Worsening  hyperglycemia P:   Terminal care Goals of Care >>DNR as of 12/14. TERMINAL EXTUBATION 12/14. Continues to survive on MSO4 drip. We will move to hospice room 12/15.  Rush Farmer, M.D. [facility] Pulmonary/Critical Care Medicine. Pager: [phone]. After hours pager: [phone].  09/05/2015, 9:34 AM

## 2015-09-08 NOTE — Progress Notes (Signed)
PULMONARY / CRITICAL CARE MEDICINE   Name: Melissa Becker MRN: [id number] DOB: August 01, 1946    ADMISSION DATE:  08/12/2015  REFERRING MD:  Anselmo Pickler, D.O. [facility])  HISTORY OF PRESENT ILLNESS:  70 year old Caucasian female with known history of diabetes mellitus as well as coronary artery disease. Presented to outside hospital on 12/3 with a two-week history of a "headache" per her husband today. Patient is currently intubated and history obtained from the patient's family as well as the electronic medical record. Patient was taking Advil for her chronic and severe headache without relief. At approximately 1 AM on 12/3 she had an unwitnessed fall in their bathroom and was found conscious by her husband. He reports she did have a rightward gaze at the time with questionable left-sided weakness. EMS assisted her in returning to her bed. The patient attempted to get up again with the assistance of her husband and fell due to weakness. At that time she was transported outside hospital. The patient's husband reports she's had no sick contacts or consumed any raw food. At outside hospital where she underwent CT imaging of the brain and while returning to the emergency department had an apparent seizure followed by a second seizure and ultimately went into ventricular fibrillation with cardiac arrest requiring defibrillation 3 times. At that time patient was on dopamine for shock and was given 1 dose of vancomycin as well as Zosyn. The patient was ultimately intubated at outside hospital. Overnight the patient was weaned off of vasopressor infusion but had questionable episodes of seizure-like activity. Patient was given an IV load of Dilantin followed by a Dilantin drip. The patient was noted to progress to oliguric renal failure just prior to transfer. Patient did reportedly have a fever to 102F at outside hospital as well.  SUBJECTIVE:  On full support   VITAL SIGNS: BP 147/43 mmHg   Pulse 49  Temp(Src) 99.7 F (37.6 C) (Oral)  Resp 18  Ht 5\' 6"  (1.676 m)  Wt 110.3 kg (243 lb 2.7 oz)  BMI 39.27 kg/m2  SpO2 89%  HEMODYNAMICS:    VENTILATOR SETTINGS:    INTAKE / OUTPUT: I/O last 3 completed shifts: In: 1406.5 [I.V.:317.5; NG/GT:885; IV Piggyback:204] Out: [id number] [Urine:675]  PHYSICAL EXAMINATION:  Gen: Chronically ill appearing, unresponsive female HEENT: Pupils pin point PULM: Decreased BS CV: IRIR GI: Hypoactive bowel sounds. Derm: Cyanotic Neuro: Unresponsive, does not withdraw to pain.  LABS; CBC Recent Labs     08/20/15  0448  08/21/15  0410  WBC  11.2*  12.1*  HGB  8.3*  7.7*  HCT  27.9*  26.3*  PLT  237  229   Coag's Recent Labs     08/21/15  0410  APTT  25  INR  1.22    BMET Recent Labs     08/20/15  0448  08/21/15  0410  NA  139  140  K  4.0  4.2  CL  98*  99*  CO2  28  26  BUN  80*  128*  CREATININE  3.62*  4.70*  GLUCOSE  215*  194*   Electrolytes Recent Labs     08/20/15  0448  08/21/15  0410  CALCIUM  8.5*  8.4*  MG   --   2.1  PHOS  6.3*  8.9*   Sepsis Markers No results for input(s): PROCALCITON, O2SATVEN in the last 72 hours.  Invalid input(s): LACTICACIDVEN  ABG Recent Labs     08/21/15  0421  PHART  7.443  PCO2ART  36.1  PO2ART  95.8    Liver Enzymes Recent Labs     08/20/15  0448  ALBUMIN  2.2*   Cardiac Enzymes No results for input(s): TROPONINI, PROBNP in the last 72 hours.  Glucose Recent Labs     08/20/15  1534  08/20/15  2010  08/21/15  0007  08/21/15  0331  08/21/15  0809  08/21/15  1147  GLUCAP  198*  156*  169*  170*  218*  347*   Imaging Dg Chest Port 1 View  08/21/2015  CLINICAL DATA:  Sepsis, thrombotic CVA, meningitis, respiratory failure, coronary artery disease EXAM: PORTABLE CHEST 1 VIEW COMPARISON:  Portable chest x-ray of August 20, 2015 FINDINGS: The lungs are adequately inflated. The cardiopericardial silhouette remains enlarged. There is new obscuration  of the left hemidiaphragm. The pulmonary interstitial markings are slightly increased bilaterally. The central pulmonary vascularity is engorged. The endotracheal tube tip lies 3.4 cm above the carina. The esophagogastric tube tip projects below the inferior margin of the image. The right internal jugular venous catheter tip projects over the proximal portion of the SVC. IMPRESSION: Mild CHF with interstitial edema and new small left pleural effusion. Stable mild subsegmental atelectasis in the right infrahilar region. Electronically Signed   By: David  Martinique M.D.   On: 08/21/2015 07:23    STUDIES:  12/03 CT head [facility]) >> atrophy 12/04 CT head >> acute Rt parietal occipital infarct, Rt cerebellar infarct 12/04 U/S Abd >> unremarkable appearance of kidneys 12/05 MRI/MRA brain >> numerous b/l infarcts concerning for embolic infarcts [id number] Echo >> EF 50%, diffuse hypokinesis, grade 1 diastolic dysfx, mod AS, mod LA dilation 12/05 EEG >> generalized slowing 12/05 MRI lumbar spine >> L2-3, L3-4 spinal stenosis, degenerative arthropathy L3-4, L4-5 12/8 MRI brain >> no change in acute infarcts  12/8 LP > only 4 WBC, 30 RBC, glucose 162, Protein normal 12/8 EEG > generalized slowing, occassional broad spike  CULTURES: 12/04 Blood >> negative 12/04 Respiratory viral panel >> negative 12/04 Pneumococcal Ag >> negative 12/05 CSF >> Negative 12/06 Legionella Ag >> negative 12/8 CSF >> neg 12/8 CSF HSV PCR > negative  ANTIBIOTICS: 12/04 Tressie Ellis >> 12/8 12/04 Ampicillin >> 12/07 12/04 Vancomycin >>  12/8 12/06 Diflucan >>12/8 12/10 Doxycycline >>   SIGNIFICANT EVENTS: 12/03 Admit to OSH w/ Seizure & Fall 12/04 Transfer to [facility]; weaned off pressors; neurology consulted 12/05 Difficulty with LP due to spinal stenosis >> only cultures sent; add HCO3 to IV fluid 12/06 Nephrology consulted; d/c HCO3 from IV fluid 12/9 Start HD>>12/13 stopped  LINES/TUBES: 12/03 ETT ([place]) >> 12/9 R IJ HD  cath >   DISCUSSION: 70 yo female presented to [facility] with 2 weeks of HA, and had developed fever up to 102F.  She had fall at home with Rtward gaze and Lt sided weakness.  She is reported to have seizure at [facility].  There is also report of ventricular fibrillation at [facility] >> ?if this was ECG artifact in setting of seizure.  She has hx of DM, HTN, TIA, Hypothyroidism, Neuropathy, Aortic stenosis, CAD, GERD, A fib, Rt foot osteomyelitis.  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 Hx of DM neuropathy  P:   RASS goal 0 AED's per neurology  Hold outpt cymbalta Re-assess mental status after further HD, not improved 12/13, needs EOL discussion.  PULMONARY A: Acute hypoxic respiratory failure and concern  for pneumonia > improved but mental status limits extubation. Interstitial edema with pleural effusions > improved P:   O2 for comfort only. Morphine for comfort.  CARDIOVASCULAR A:  Hx of A fib >> not on anticoagulation as outpt. Hx of HTN, CAD, HLD, mod aortic stenosis. VF arrest at outside hospital >> happened with seizure, might have been ECG artifact in setting of seizure. Elevated troponin >> demand ischemia. Intermittent bradycardia. Hypertension. P:  D/C HTN medications.  RENAL Lab Results  Component Value Date   CREATININE 4.70* 08/21/2015   CREATININE 3.62* 08/20/2015   CREATININE 5.40* 08/19/2015   A:   AKI with oliguria >> baseline creatinine 0.8 from August 2014. P:   No further dialysis.  GASTROINTESTINAL A:   Nutrition. P:   D/C TF  HEMATOLOGIC  Recent Labs  08/20/15 0448 08/21/15 0410  HGB 8.3* 7.7*   A:   Anemia of critical illness and chronic disease. P:  D/C further blood draws.  INFECTIOUS A:   Septic shock >> likely from pneumonia with possible viral prodrome> resolved. Chronic Rt foot osteomyelitis. Yeast UTI. P:   D/C  abx.  ENDOCRINE CBG (last 3)   Recent Labs  08/21/15 0331 08/21/15 0809 08/21/15 1147  GLUCAP 170* 218* 347*   A:   Hx of DM, hypothyroidism Worsening hyperglycemia P:   D/C CBGs and SSI.  Full comfort care at this point.  Rush Farmer, M.D. [facility] Pulmonary/Critical Care Medicine. Pager: [phone]. After hours pager: [phone].

## 2015-09-08 NOTE — Progress Notes (Signed)
STROKE TEAM PROGRESS NOTE   SUBJECTIVE (INTERVAL HISTORY) Her RN , is at the bedside. She has been extubated and is now full comfort care and is on morphine drip. She appears to be peaceful OBJECTIVE Pulse Rate:  [30-149] 49 (12/15 0800) Cardiac Rhythm:  [-] Heart block (12/15 0729) Resp:  [10-25] 11 (12/15 1100) BP: (124-181)/(43-56) 147/43 mmHg (12/15 0729) SpO2:  [87 %-100 %] 89 % (12/15 0800) FiO2 (%):  [40 %] 40 % (12/14 1331) Weight:  [243 lb 2.7 oz (110.3 kg)] 243 lb 2.7 oz (110.3 kg) (12/15 0500)  CBC:   Recent Labs Lab 08/18/15 1024 08/19/15 0455 08/20/15 0448 08/21/15 0410  WBC 10.3 11.4* 11.2* 12.1*  NEUTROABS 8.5* 9.4*  --   --   HGB 8.9* 9.3* 8.3* 7.7*  HCT 30.7* 31.3* 27.9* 26.3*  MCV 90.8 89.7 90.0 90.4  PLT 222 252 237 [id number]    Basic Metabolic Panel:   Recent Labs Lab 08/20/15 0448 08/21/15 0410  NA 139 140  K 4.0 4.2  CL 98* 99*  CO2 28 26  GLUCOSE 215* 194*  BUN 80* 128*  CREATININE 3.62* 4.70*  CALCIUM 8.5* 8.4*  MG  --  2.1  PHOS 6.3* 8.9*    Lipid Panel:     Component Value Date/Time   CHOL 127 08/13/2015 0238   TRIG 191* 08/13/2015 0238   TRIG 195* 08/13/2015 0238   HDL 28* 08/13/2015 0238   CHOLHDL 4.5 08/13/2015 0238   VLDL 39 08/13/2015 0238   LDLCALC 60 08/13/2015 0238   HgbA1c:  Lab Results  Component Value Date   HGBA1C 7.4* 08/12/2015   Urine Drug Screen:     Component Value Date/Time   LABOPIA POSITIVE* 08/12/2015 1430   COCAINSCRNUR NONE DETECTED 08/12/2015 1430   LABBENZ POSITIVE* 08/12/2015 1430   AMPHETMU NONE DETECTED 08/12/2015 1430   THCU NONE DETECTED 08/12/2015 1430   LABBARB NONE DETECTED 08/12/2015 1430      IMAGING I have personally reviewed the radiological images below and agree with the radiology interpretations.  CT HEAD 08/09/2015   Acute moderate RIGHT parietal occipital infarct (watershed versus angular branch of the MCA). Small acute RIGHT cerebellar infarct. Small area RIGHT frontal  encephalomalacia may be posttraumatic or ischemic.   US Abdomen Complete 08/10/2015  1. Status post cholecystectomy. No acute abnormality seen within the abdomen. 2. Kidneys unremarkable in appearance. 3. Scattered calcific atherosclerotic disease along the abdominal aorta.     MRI and MRA HEAD 08/12/2015  Exam is motion degraded. Numerous bilateral acute/subacute nonhemorrhagic infarcts. Most confluent infarcts have an appearance of acute/subacute infarct involves portions of the right frontal -parietal - occipital and posterior right temporal lobe. Swelling of gyri without significant local mass effect. Smaller acute nonhemorrhagic infarcts scattered throughout the hemispheres bilaterally (frontal lobes, parietal lobes, occipital lobes), right temporal lobe/subinsular region and cerebellum bilaterally. The involvement of multiple vascular distributions raises possibility of embolic disease. Remote small right cerebellar infarct. Exam is slightly motion degraded. Mild irregularity and slight narrowing cavernous segment right internal carotid artery. Small bulge may be related to atherosclerotic type changes rather than aneurysm. No significant stenosis of either carotid terminus or M1 segment of either middle cerebral artery. Middle cerebral artery branch vessel irregularity and narrowing bilaterally. Mild narrowing and irregularity A1 segment right anterior cerebral artery. Bulge of the distal A1 segment of the right anterior cerebral artery appears to be origin of a vessel rather than saccular aneurysm. Mild to moderate narrowing A2 segment right anterior cerebral  artery. Left vertebral artery is dominant. Mild to moderate narrowing distal right vertebral artery. Mild to moderate narrowing portions of the posterior inferior cerebellar artery bilaterally. Mild to moderate narrowing portions of the anterior inferior cerebellar artery bilaterally. Narrowing of the superior cerebellar artery more notable on the  left. Mild to moderate narrowing portions of the proximal, mid and distal aspect of the posterior cerebral artery bilaterally. Tiny bulge superior margin P1 segments left posterior cerebral artery. Question tiny aneurysm (less than 2 mm). A vessel may rise from this region.   Mr Brain Wo Contrast  08/15/2015  IMPRESSION: Multiple areas of acute infarction are unchanged from 08/12/2015. Negative for hemorrhage. Findings suggest cerebral emboli.  14:08   CUS - Bilateral: 1-39% ICA stenosis. Vertebral artery flow is antegrade.  2D echo - Normal LV size with EF 50%, diffuse hypokinesis. Mildly dilated RV with normal systolic function. Biatrial enlargement. Moderate aortic stenosis. Dilated IVC suggests elevated RV filling pressure. Mild pulmonary hypertension.  Mr Lumbar Spine Wo Contrast  08/12/2015  IMPRESSION: No complications seen following unsuccessful lumbar puncture. Mild multifactorial spinal stenosis at L2-3 and L3-4 could have contributed to the difficulty at at lumbar puncture. Degenerative facet arthropathy at L3-4 and L4-5. Should a lumbar puncture be repeated, greater likelihood of success might be obtained at the L4-5 or L5-S1 level.  Dg Chest Port 1 View 08/16/2015  IMPRESSION: Allowing for differences in patient positioning there has been overall improvement in the appearance of the pulmonary interstitium especially on the right. There is persistent left lower lobe atelectasis or pneumonia with small left pleural effusion. Stable cardiomegaly. The support tubes are in reasonable position.   08/13/2015  IMPRESSION: 1. Persistent left lower lobe atelectasis and small left pleural effusion. Stable mild low-grade CHF. The endotracheal tube is in reasonable position. 2. The nasogastric tube tip and proximal port lie in the region of the gastric body below the expected location of the GE junction.   EEG - This EEG is abnormal with severe generalized continuous nonspecific slowing  cerebral activity. This pattern of slowing can be seen with metabolic and toxic encephalopathies, as well as with severe degenerative central nervous system disorders. No evidence of epileptiform activity was recorded.   Repeat EEG  08/16/2015 Impression: This EEG is abnormal due to the presence of: 1. Moderate diffuse slowing of the background 2. Occasional broad sharp waves over the right frontotemporal and right frontal regions   Portable chest x-ray 1 view 08/18/2015 1. Support apparatus satisfactory. 2. Improved aeration in the lung bases since yesterday, with only mild atelectasis persisting at the right base. 3.No acute cardiopulmonary disease otherwise.   Physical exam  Pulse Rate:  [30-149] 49 (12/15 0800) Resp:  [10-25] 11 (12/15 1100) BP: (124-181)/(43-56) 147/43 mmHg (12/15 0729) SpO2:  [87 %-100 %] 89 % (12/15 0800) FiO2 (%):  [40 %] 40 % (12/14 1331) Weight:  [243 lb 2.7 oz (110.3 kg)] 243 lb 2.7 oz (110.3 kg) (12/15 0500)  General - Well nourished, well developed Caucasian lady  HEENT: NCAT; PERRL; sclera slightly injected; intubated Cardiovascular - irregular rhythm and rate. Pulmonary: CTA Abdomen: soft: ND Extremities: ankle edema  Neuro Mental Status:  Eyes are closed. does not follow commands.  Cranial Nerves PERRL; no blink to threat; OCRs intact; eye closure grossly symmetric.  Spontaneous cough; breathing over the vent  Motor/Sensory: Left UE with extn to noxious stimuli; right upper extremity with w/d to noxious stimuli; right LE slight w/d and left LE no response to noxious  stimuli  Coordination/Gait: Deferred due to LOC    ASSESSMENT/PLAN Melissa Becker is a 70 y.o. female with history of DM, HTN, CAD, atrial fibrillation, TIA, GI bleed presenting with complaints of a HA who fell at home due to weakness who developed seizures followed by v fib/cardiac arrest at an outlying hospital. She was intubated and transferred to done where she was  found to have a R parietal occipital infarct and small R cerebellar infarct. She did not receive IV t-PA due to unknown time last known well.   Stroke:  bilateral anterior and posterior, infra and supratentorial infarcts, embolic secondary to known atrial fibrillation not on AC  Resultant  AMS, seizure and BUE plegia,  Multifactorial mental status change likely from sepsis, uremia, strokes  MRI  bilateral anterior and posterior, infra and supratentorial infarcts, largest at right temporal lobe at MCA distribution  MRA diffuse intracranial atherosclerosis, ? Tiny L PCA aneurysm  Repeat MRI brain 08/15/15 no change  Carotid Doppler  Unremarkable   Ammonia level 22 (WNL) on 08/17/2015  2D Echo  EF 50%  LDL 60  HgbA1c 7.4  Heparin 5000 units sq tid for VTE prophylaxis Diet NPO time specified  No antithrombotic prior to admission, now on ASA 325mg  for stroke prevention. No AC at this time due to risk of hemorrhagic transformation in the setting of large infarcts at right temporal lobe. Will consider AC in 5-7 days post stroke and if no planned procedures.   Ongoing aggressive stroke risk factor management  Therapy recommendations:  pending  Disposition:  pending  Seizure  Likely secondary to cortical infarcts  EEG no seizure  Repeat EEG today on 08/16/2015 did not show seizure   Dilantin level daily 08/18/2015 - <2.5; corrected level 7 or 8  Discussed with pharmacist and Dr Belenda Cruise - increase dose to 200 mg Q12 hours.    free level in AM today pending.May need to give additional dose after dialysis if level is hard to maintain  Uremic encephalopathy  Pt still open eyes but not tracking and not moving LEs as before  MRI no acute changes comparing with initial MRI  LP not supportive for CNS infection   Neuro change likely due to uremic encephalopathy  Ammonia level 22 (WNL) on 08/17/2015  AKI and uremia  CK 134  Cre 3.91 -> 4.26 -> 5.44-> 6.06-> 6.71->7.21-->5.2  -> 4.65  BUN 91 Sunday  Nephrology on board  On HD   ? Sepsis  procalcitonin 19.06 -> 16.71->12.60  afebrile  Temp 100.8  today (08/18/2015) CBC with diff. Pending  Portable chest x-ray today - improved (see above)  CSF cultures no growth so far  Check urine  Off antibiotics  CSF not supportive for CNS infection  Pt afebrile, WBC normolized  Vent dependent respiratory failure  Intubated  CCM managing  Likely need trach soon  Atrial Fibrillation  Home anticoagulation:  No antithrombotics at home  CHA2DS2-VASc Score = 6, ?2 oral anticoagulation recommended  Age in Years:  33-74   +1   Sex:  Female   +1    Hypertension History: yes   +1     Diabetes Mellitus:  yes   +1   Congestive Heart Failure History:  0  Vascular Disease History:  0     Stroke/TIA/Thromboembolism History: yes   +2  On ASA for now. Hold off AC for 7 days to avoid hemorrhagic transformation and if no planned procedures.  Would reconsider on Monday.  Possibly repeat  CT scan then to ensure no medical contraindication to [facility]    Hypertension  Still elevated likely due to AKI  BP goal normotensive.  Hyperlipidemia  Home meds:  lipitor 80 resumed in hospital  LDL 60, goal < 70  Continue statin at discharge  Diabetes type II  HgbA1c 7.4, goal < 7.0  Not controlled  SSI  Other Stroke Risk Factors  Advanced age  Former Cigarette smoker, quit smoking 22 years ago   Morbid Obesity, Body mass index is 39.27 kg/(m^2).   Hx stroke/TIA  Coronary artery disease  Aortic stenosis  Anemia H/H  10.0 / 32.5   Other Active Problems  Hypothyroid  Osteomyelitis R foot  Hospital day # 11   Patient was seen and examined by me personally.  No family available at bedside. Patient has been made for comfort care. Stroke team will sign off. Kindly call for questions.      SIGNED BY: Antony Contras, MD   To contact Stroke Continuity provider, please refer to [email or web address] After hours,  contact General Neurology

## 2015-09-08 NOTE — Progress Notes (Signed)
Pt on O2 and breathing shallow on Morphine drip. Daughter at bedside. Patient having seizure like activity. Daughter stated she felt her mother was in pain so pt received a morphine bolus. Patient passed with daughter at bedside. Ulyses Jarred RN and Charge RN [place] listened to patient pulseless and breathless for one minute. Daughter said goodbyes to patient. [place] phoned primary.

## 2015-09-08 NOTE — Progress Notes (Signed)
Pt transferred to 6n16 via bed with family at bedside

## 2015-09-08 NOTE — Progress Notes (Signed)
Nutrition Brief Note  Chart reviewed. Pt now transitioning to comfort care.  No further nutrition interventions warranted at this time.  Please re-consult as needed.    Kaydon Creedon, RD, LDN, CNSC Pager [phone] After Hours Pager [phone]    

## 2015-09-08 DEATH — deceased

## 2015-09-10 LAB — FUNGUS CULTURE W SMEAR: Fungal Smear: NONE SEEN

## 2015-10-09 NOTE — Discharge Summary (Addendum)
NAME:  Melissa Becker, Melissa Becker NO.:  [id number]  MEDICAL RECORD NO.:  [id number]  LOCATION:  6N16C                        FACILITY:  [facility]  PHYSICIAN:  Providence Lanius, MD  DATE OF BIRTH:  26-May-1946  DATE OF ADMISSION:  08/10/2015 DATE OF DISCHARGE:  09/08/2015                              DISCHARGE SUMMARY   DEATH SUMMARY.  PRIMARY DIAGNOSIS/CAUSE OF DEATH:  Anoxic brain injury.  SECONDARY DIAGNOSES:  Acute respiratory failure with hypoxemia, pneumonia, acute pulmonary edema, atrial fibrillation, coronary artery disease, status epilepticus of ventricular fibrillation arrest, acute kidney injury with oliguria, septic shock, chronic diastolic heart failure, and diabetes mellitus.  HOSPITAL COURSE:  The patient is a 70 year old female with extensive past medical history, who suffered ventricular fibrillation, cardiac arrest outside the hospital, brought on the 3rd to [facility] and then transferred to [facility] where she was treated in the Intensive Care Unit.  After extensive testing, the patient was noted to have refractory seizure, and with Neurology's help, was unable to quiet down the seizure.  Neurology as well as myself attended her family meeting, the patient will never be her normal self again.  The family was very clear the patient would not want this level of care unless there are promising chances of getting back to a normal life.  After discussion, morphine was started, the patient was extubated, and expired comfortably with family at bedside.     Providence Lanius, MD     WJY/MEDQ  D:  09/19/2015  T:  09/19/2015  Job:  [id number]

## 2017-03-24 IMAGING — MR MR MRA HEAD W/O CM
9 of 11 series · 29 of 48 positions shown · non-contrast
Comparison: 08/11/2015 head CT.  02/13/2010 brain MR.

CLINICAL DATA: 69-year-old diabetic hypertensive female with
coronary artery disease presenting with 2 week history of headache.
Unwitnessed fall 5 days ago. Left-sided weakness. Subsequent
encounter.

EXAM:
MRI HEAD WITHOUT CONTRAST
MRA HEAD WITHOUT CONTRAST
TECHNIQUE: Multiplanar, multiecho pulse sequences of the brain and surrounding
structures were obtained without intravenous contrast. Angiographic
images of the head were obtained using MRA technique without
contrast.

[Series 2: FLAIR · sagittal · 5.0mm · 0.47mm/px · 3 of 23 slices shown (1 of 3)]
[im 1/23]
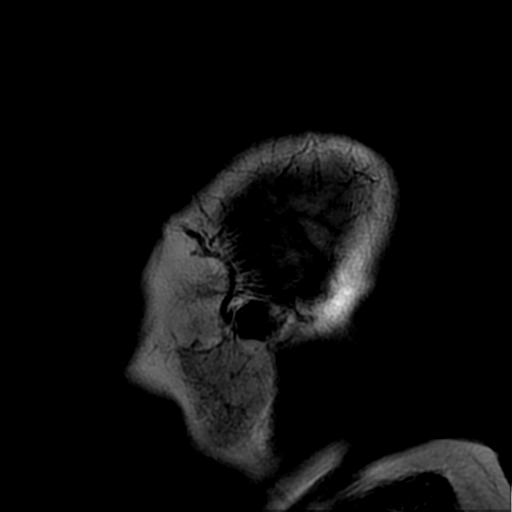
[im 12/23]
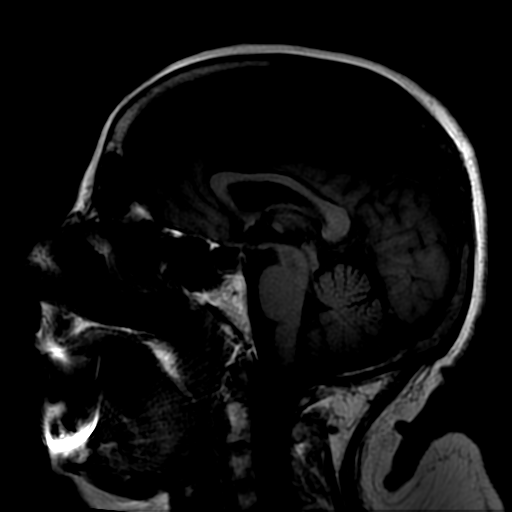
[im 23/23]
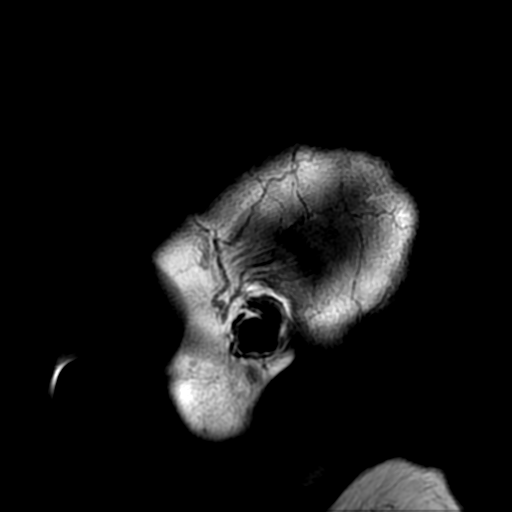

[Series 5: DWI · axial · 3.6mm · 0.94mm/px · z∈[-106,+47]mm · 7 of 88 slices shown (1 of 4)]
[im 1/88]
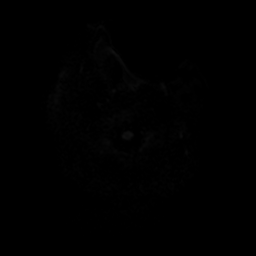
[im 15/88]
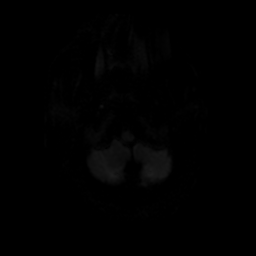
[im 30/88]
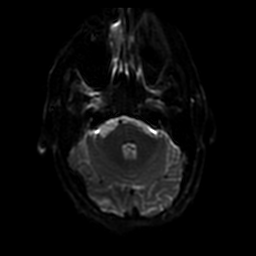
[im 44/88]
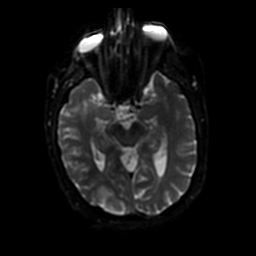
[im 59/88]
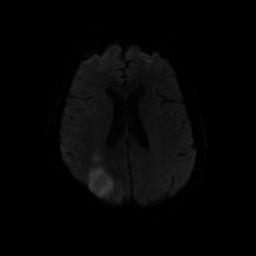
[im 73/88]
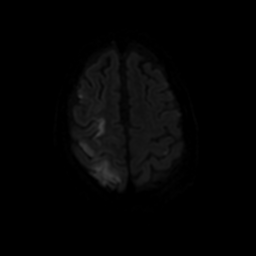
[im 88/88]
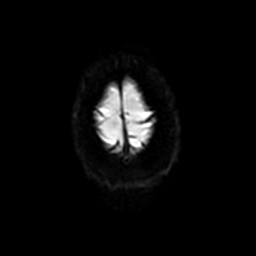

[Series 6: T2 · axial · 5.0mm · 0.47mm/px · z∈[-107,+48]mm · 2 of 27 slices shown (1 of 2)]
[im 1/27]
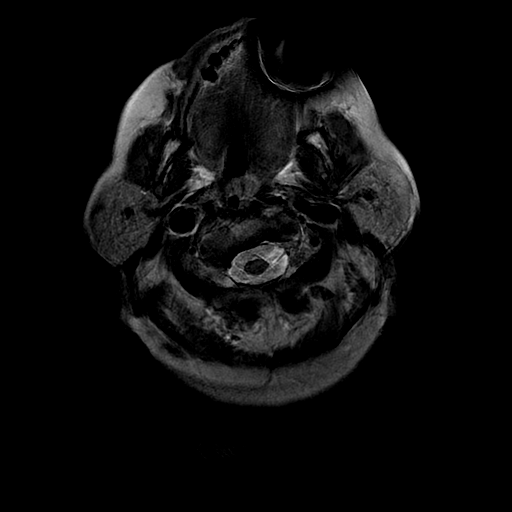
[im 27/27]
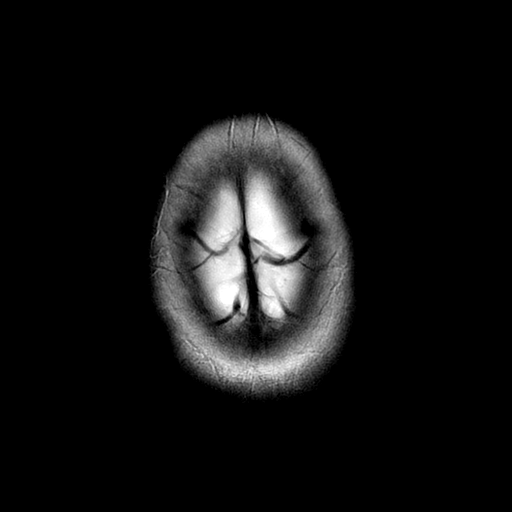

[Series 7: FLAIR · axial · 5.0mm · 0.47mm/px · z∈[-107,+48]mm · 2 of 27 slices shown (2 of 3)]
[im 1/27]
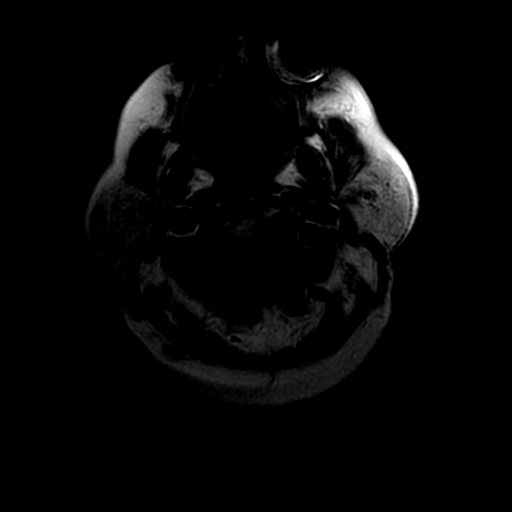
[im 27/27]
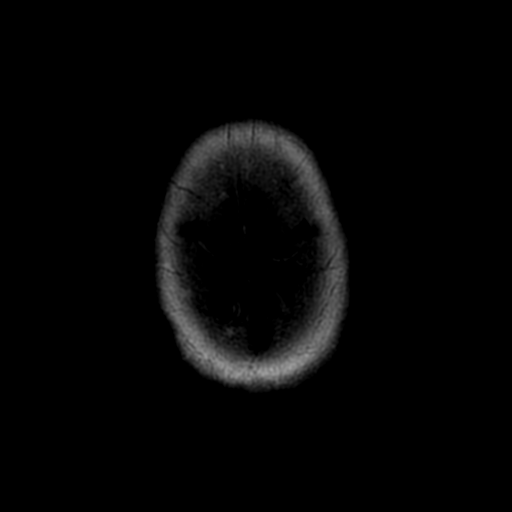

[Series 8: DWI · coronal · 5.0mm · 0.94mm/px · 5 of 66 slices shown (2 of 4)]
[im 1/66]
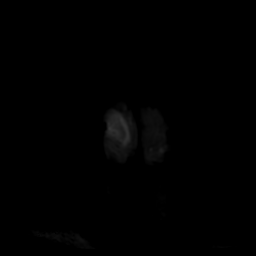
[im 17/66]
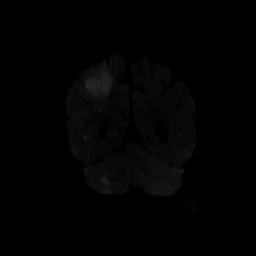
[im 33/66]
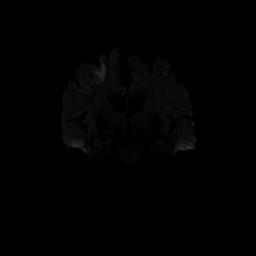
[im 49/66]
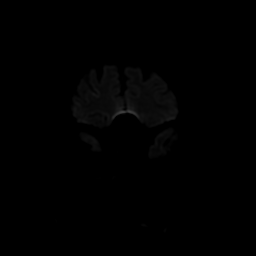
[im 66/66]
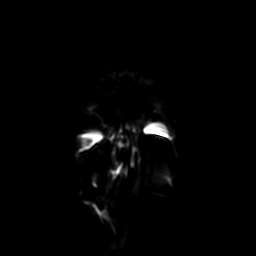

[Series 10: FLAIR · axial · 5.0mm · 0.47mm/px · z∈[-107,+48]mm · 2 of 27 slices shown (3 of 3)]
[im 1/27]
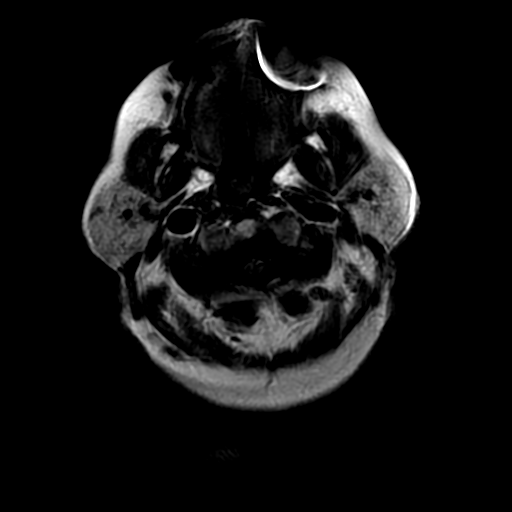
[im 27/27]
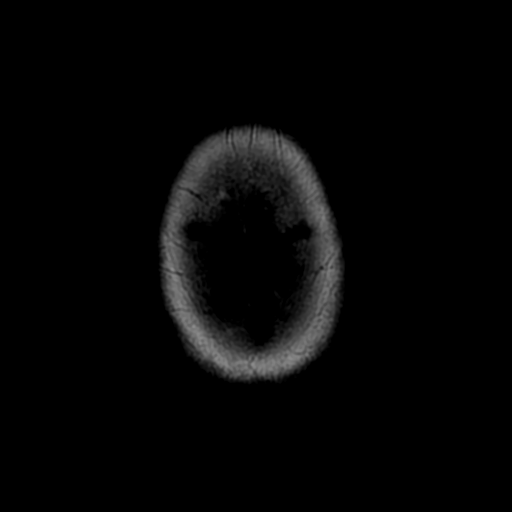

[Series 11: T2 · coronal · 5.0mm · 0.47mm/px · 1 of 28 slices shown (2 of 2)]
[im 1/28]
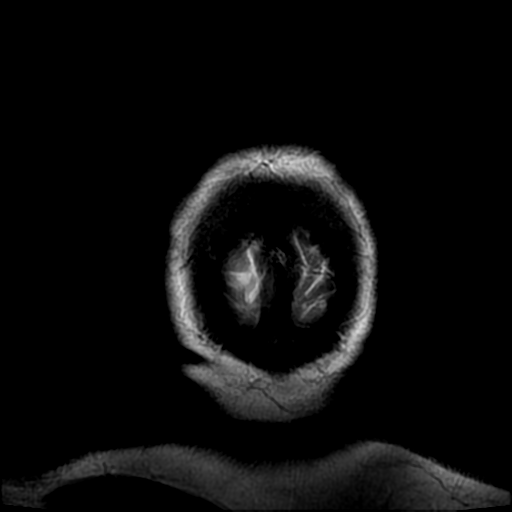

[Series 500: DWI · axial · 3.6mm · 0.94mm/px · z∈[-106,+47]mm · 4 of 44 slices shown (3 of 4)]
[im 1/44]
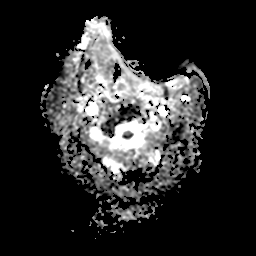
[im 15/44]
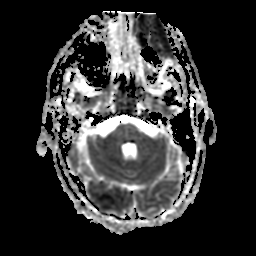
[im 29/44]
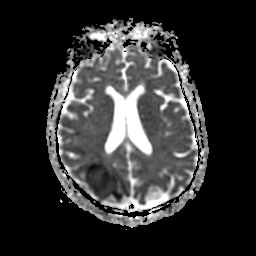
[im 44/44]
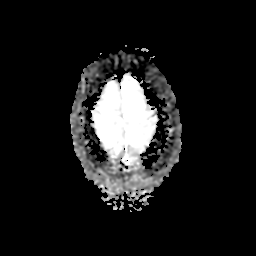

[Series 800: DWI · coronal · 5.0mm · 0.94mm/px · 3 of 33 slices shown (4 of 4)]
[im 1/33]
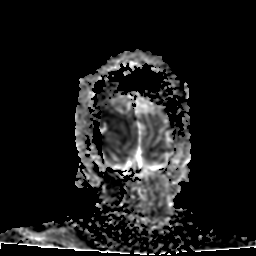
[im 17/33]
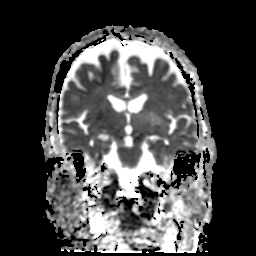
[im 33/33]
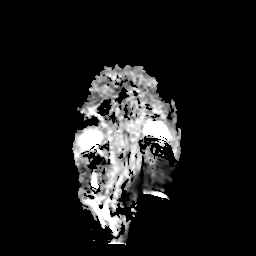

[29 of 48 positions shown; findings below may reference images not displayed]

FINDINGS: MRI HEAD FINDINGS

Exam is motion degraded.

Numerous bilateral acute/subacute nonhemorrhagic infarcts. Most
confluent infarcts have an appearance of acute/subacute infarct
involves portions of the right frontal -parietal - occipital and
posterior right temporal lobe. Swelling of gyri without significant
local mass effect.

Smaller acute nonhemorrhagic infarcts scattered throughout the
hemispheres bilaterally (frontal lobes, parietal lobes, occipital
lobes), right temporal lobe/subinsular region and cerebellum
bilaterally.

The involvement of multiple vascular distributions raises
possibility of embolic disease.

Remote small right cerebellar infarct.

No intracranial hemorrhage.

Mild global atrophy without hydrocephalus.

No intracranial mass lesion noted on this unenhanced exam.

Mild exophthalmos.

Partially empty expanded sella without flattening of the posterior
globes as may be seen with pseudotumor.

Cervical medullary junction and pineal region unremarkable.

MRA HEAD FINDINGS

Exam is slightly motion degraded.

Mild irregularity and slight narrowing cavernous segment right
internal carotid artery where there is a minimal bulge which has an
appearance most suggestive of result of atherosclerotic type changes
rather than aneurysm. Fetal type contribution to the posterior
cerebral arteries bilaterally.

No significant stenosis of either carotid terminus or M1 segment of
either middle cerebral artery. Middle cerebral artery branch vessel
irregularity and narrowing bilaterally.

Mild narrowing and irregularity A1 segment right anterior cerebral
artery. Bulge of the distal A1 segment of the right anterior
cerebral artery appears to be origin of a vessel rather than
saccular aneurysm. Mild to moderate narrowing A2 segment right
anterior cerebral artery.

Left vertebral artery is dominant. Mild moderate narrowing distal
right vertebral artery.

Mild to moderate narrowing portions of the posterior inferior
cerebellar artery bilaterally.

Mild irregularity and minimal narrowing basilar artery without
high-grade stenosis.

Mild to moderate narrowing portions of the anterior inferior
cerebellar artery bilaterally.

Narrowing of the superior cerebellar artery more notable on the
left.

Mild to moderate narrowing portions of the proximal, mid and distal
aspect of the posterior cerebral artery bilaterally.

Tiny bulge superior margin P1 segments left posterior cerebral
artery. Question tiny aneurysm (less than 2 mm). A vessel may rise
from this region.
IMPRESSION: MRI HEAD

Exam is motion degraded.

Numerous bilateral acute/subacute nonhemorrhagic infarcts. Most
confluent infarcts have an appearance of acute/subacute infarct
involves portions of the right frontal -parietal - occipital and
posterior right temporal lobe. Swelling of gyri without significant
local mass effect.

Smaller acute nonhemorrhagic infarcts scattered throughout the
hemispheres bilaterally (frontal lobes, parietal lobes, occipital
lobes), right temporal lobe/subinsular region and cerebellum
bilaterally.

The involvement of multiple vascular distributions raises
possibility of embolic disease.

Remote small right cerebellar infarct.

MRA HEAD

Exam is slightly motion degraded.

Mild irregularity and slight narrowing cavernous segment right
internal carotid artery. Small bulge may be related to
atherosclerotic type changes rather than aneurysm.

No significant stenosis of either carotid terminus or M1 segment of
either middle cerebral artery. Middle cerebral artery branch vessel
irregularity and narrowing bilaterally.

Mild narrowing and irregularity A1 segment right anterior cerebral
artery. Bulge of the distal A1 segment of the right anterior
cerebral artery appears to be origin of a vessel rather than
saccular aneurysm. Mild to moderate narrowing A2 segment right
anterior cerebral artery.

Left vertebral artery is dominant. Mild to moderate narrowing distal
right vertebral artery.

Mild to moderate narrowing portions of the posterior inferior
cerebellar artery bilaterally.

Mild to moderate narrowing portions of the anterior inferior
cerebellar artery bilaterally.

Narrowing of the superior cerebellar artery more notable on the
left.

Mild to moderate narrowing portions of the proximal, mid and distal
aspect of the posterior cerebral artery bilaterally.

Tiny bulge superior margin P1 segments left posterior cerebral
artery. Question tiny aneurysm (less than 2 mm). A vessel may rise
from this region.
